# Patient Record
Sex: Female | Born: 1960 | Race: White | Hispanic: No | Marital: Single | Smoking: Former smoker
Health system: Southern US, Community
[De-identification: ages and names within clinical notes are randomized; demographics above are authoritative.]

## PROBLEM LIST (undated history)

## (undated) DIAGNOSIS — F32A Depression, unspecified: Secondary | ICD-10-CM

## (undated) DIAGNOSIS — R011 Cardiac murmur, unspecified: Secondary | ICD-10-CM

## (undated) DIAGNOSIS — Z789 Other specified health status: Secondary | ICD-10-CM

## (undated) DIAGNOSIS — I10 Essential (primary) hypertension: Secondary | ICD-10-CM

## (undated) DIAGNOSIS — F329 Major depressive disorder, single episode, unspecified: Secondary | ICD-10-CM

## (undated) HISTORY — PX: TONSILLECTOMY: SUR1361

---

## 2002-05-28 ENCOUNTER — Other Ambulatory Visit: Admission: RE | Admit: 2002-05-28 | Discharge: 2002-05-28 | Payer: Self-pay | Admitting: *Deleted

## 2002-06-24 ENCOUNTER — Encounter: Admission: RE | Admit: 2002-06-24 | Discharge: 2002-06-24 | Payer: Self-pay | Admitting: *Deleted

## 2004-08-04 ENCOUNTER — Ambulatory Visit (HOSPITAL_BASED_OUTPATIENT_CLINIC_OR_DEPARTMENT_OTHER): Admission: RE | Admit: 2004-08-04 | Discharge: 2004-08-04 | Payer: Self-pay | Admitting: *Deleted

## 2004-08-04 ENCOUNTER — Encounter (INDEPENDENT_AMBULATORY_CARE_PROVIDER_SITE_OTHER): Payer: Self-pay | Admitting: Specialist

## 2004-08-04 ENCOUNTER — Ambulatory Visit (HOSPITAL_COMMUNITY): Admission: RE | Admit: 2004-08-04 | Discharge: 2004-08-04 | Payer: Self-pay | Admitting: *Deleted

## 2005-08-14 ENCOUNTER — Encounter: Admission: RE | Admit: 2005-08-14 | Discharge: 2005-08-14 | Payer: Self-pay | Admitting: *Deleted

## 2009-10-31 ENCOUNTER — Encounter: Admission: RE | Admit: 2009-10-31 | Discharge: 2009-10-31 | Payer: Self-pay | Admitting: Family Medicine

## 2010-11-03 NOTE — Op Note (Signed)
Mariah Diaz, Mariah Diaz               ACCOUNT NO.:  [id number]   MEDICAL RECORD NO.:  [id number]          PATIENT TYPE:  AMB   LOCATION:  [facility]                         FACILITY:  [facility]   PHYSICIAN:  Pershing Cox, M.D.DATE OF BIRTH:  [birth year]   DATE OF PROCEDURE:  08/04/2004  DATE OF DISCHARGE:                                 OPERATIVE REPORT   PREOPERATIVE DIAGNOSIS:  Postmenopausal bleeding and polyp on hydrosonogram.   POSTOPERATIVE DIAGNOSIS:  Tiny endometrial polyp.   PROCEDURE:  1.  Examination under anesthesia.  2.  Fractional D&C.  3.  Hysteroscopy.   SURGEON:  Dr. Carey Bullocks   ASSISTANT:  None.   ANESTHESIA:  General by LMA and Marcaine paracervical block.   SPECIMENS:  Endocervical curettings and endometrial curettings.   INDICATIONS FOR PROCEDURE:  Mariah Diaz is a 50 year old female, who has had no  normal menstrual periods since July 2003.  She began spotting in August  2005.  About that time, we brought her back for a progesterone-induced  withdrawal, and the endometrial lining was thickened at the apex.  She  returned for a follow up hydrosonogram, and there was a confirmation of a  small endometrial polyp seen on this study.  The patient is brought to the  operating room today for resection of this polyp and assurance that there is  hyperplasia in her endometrial cavity.   FINDINGS:  The patient's uterus is retroverted.  There were no adnexal  masses.  The cavity sounded to 3.75 inches.  There were no filling defects  seen.  There was some tissue lying on the posterior wall of the uterus and  when this was removed, it looks like a small polyp.   DESCRIPTION OF PROCEDURE:  Mariah Diaz was brought to the operating room  with an IV in place.  She received 1 g of Ancef in the holding area.  Supine  on the OR table, IV sedation was administered, and an LMA was then easily  placed.  She was then placed into Allen stirrups, and the vagina, perineum  were prepped  with Betadine.  Exam under anesthesia was performed, and the  patient was then draped for a sterile vaginal procedure.  The bladder was  emptied using sterile technique and a red rubber catheter.   The patient's cervix was quite stenotic.  Two tenaculums were placed on the  anterior and posterior cervix.  The vagina was opened using a Simms  retractor, as the weighted retractor was too large.  Endocervical curettings  were obtained.  The sound passed to a depth of 3.75 inches in a retroverted  fashion.  Serial Pratt dilators were then used to dilate the cervix to admit  the resectoscope.  With the resectoscope and through-and-through sorbitol  irrigation, the cavity was visualized and photographed.  Both ostia were  seen.  There was no visible polyp seen, but there was tissue on the  posterior uterine wall and later when this was curetted, on the Telfa, it  appeared to be a small polyp.  A  small sharp curette was used to carry out the curettage.  Following this,  the Simms curette was used to curette the remaining endometrial walls.  The  sound passed again to 3.5 inches.  The patient tolerated the procedure well  and no evidence of perforation.  Specimens included endocervical curettings  and endometrial curettings.      MAJ/MEDQ  D:  08/04/2004  T:  08/04/2004  Job:  [id number]

## 2011-01-31 ENCOUNTER — Other Ambulatory Visit: Payer: Self-pay | Admitting: Family Medicine

## 2011-01-31 DIAGNOSIS — Z1231 Encounter for screening mammogram for malignant neoplasm of breast: Secondary | ICD-10-CM

## 2011-02-20 ENCOUNTER — Ambulatory Visit
Admission: RE | Admit: 2011-02-20 | Discharge: 2011-02-20 | Disposition: A | Discharge status: Home / Self Care | Payer: Self-pay | Source: Ambulatory Visit | Attending: Family Medicine | Admitting: Family Medicine

## 2011-02-20 DIAGNOSIS — Z1231 Encounter for screening mammogram for malignant neoplasm of breast: Secondary | ICD-10-CM

## 2012-03-07 ENCOUNTER — Other Ambulatory Visit: Payer: Self-pay | Admitting: Family Medicine

## 2012-03-07 DIAGNOSIS — Z1231 Encounter for screening mammogram for malignant neoplasm of breast: Secondary | ICD-10-CM

## 2012-03-11 ENCOUNTER — Ambulatory Visit (INDEPENDENT_AMBULATORY_CARE_PROVIDER_SITE_OTHER): Payer: 59

## 2012-03-11 DIAGNOSIS — Z1231 Encounter for screening mammogram for malignant neoplasm of breast: Secondary | ICD-10-CM

## 2012-03-11 DIAGNOSIS — R928 Other abnormal and inconclusive findings on diagnostic imaging of breast: Secondary | ICD-10-CM

## 2012-03-13 ENCOUNTER — Other Ambulatory Visit: Payer: Self-pay | Admitting: Family Medicine

## 2012-03-13 DIAGNOSIS — R928 Other abnormal and inconclusive findings on diagnostic imaging of breast: Secondary | ICD-10-CM

## 2012-03-17 ENCOUNTER — Ambulatory Visit
Admission: RE | Admit: 2012-03-17 | Discharge: 2012-03-17 | Disposition: A | Discharge status: Home / Self Care | Payer: 59 | Source: Ambulatory Visit | Attending: Family Medicine | Admitting: Family Medicine

## 2012-03-17 DIAGNOSIS — R928 Other abnormal and inconclusive findings on diagnostic imaging of breast: Secondary | ICD-10-CM

## 2012-04-11 ENCOUNTER — Other Ambulatory Visit: Payer: Self-pay | Admitting: Orthopaedic Surgery

## 2012-04-14 ENCOUNTER — Other Ambulatory Visit: Payer: Self-pay | Admitting: Orthopaedic Surgery

## 2012-04-17 ENCOUNTER — Encounter (HOSPITAL_BASED_OUTPATIENT_CLINIC_OR_DEPARTMENT_OTHER): Payer: Self-pay | Admitting: *Deleted

## 2012-04-17 NOTE — Progress Notes (Signed)
No labs needed No heart or resp problems 

## 2012-04-18 NOTE — H&P (Signed)
Mariah Diaz is an 51 y.o. female.   Chief Complaint: right knee pain and instability. HPI: Mariah Diaz suffered a twisting injury to her right knee a few months ago.  Since that time she has had some discomfort but the biggest problem is her knee giving way or being unstable.  She is not able to do normal activities and count on her knee not giving way.  She has been through physical therapy and this instability continues.  MRI scan confirms ACL tear.  Date of scan was 02/22/12.  We had discussed proceeding with an ACL reconstruction of the right knee to improve her stability and function.  Past Medical History  Diagnosis Date  . No pertinent past medical history   . Depression     Past Surgical History  Procedure Date  . Tonsillectomy     No family history on file. Social History:  reports that she quit smoking about 9 years ago. She does not have any smokeless tobacco history on file. She reports that she drinks alcohol. Her drug history not on file.  Allergies: No Known Allergies  No prescriptions prior to admission    No results found for this or any previous visit (from the past 48 hour(s)). No results found.  Review of Systems  Constitutional: Negative.   HENT: Negative.   Eyes: Negative.   Respiratory: Negative.   Cardiovascular: Negative.   Gastrointestinal: Negative.   Genitourinary: Negative.   Musculoskeletal: Positive for joint pain.  Skin: Negative.   Neurological: Negative.   Endo/Heme/Allergies: Negative.   Psychiatric/Behavioral: Negative.     Height 5\' 6"  (1.676 m), weight 68.04 kg (150 lb). Physical Exam  Constitutional: She is oriented to person, place, and time. She appears well-nourished.  HENT:  Head: Atraumatic.  Eyes: EOM are normal.  Neck: Neck supple.  Cardiovascular: Normal rate.   Respiratory: Breath sounds normal.  GI: Bowel sounds are normal.  Musculoskeletal:       Right knee exam range of motion 0-130.  Mariah Diaz effusion.  Positive laxity for  Lachman and drawer testing.  Stable to varus and valgus testing.  Neurological: She is oriented to person, place, and time.  Skin: Skin is warm.     Assessment/Plan Assessment: Right knee torn ACL ligament Plan: We have discussed proceeding with a ACL reconstruction surgery to give her right knee more stability and less pain.  We've been to the risks of anesthesia, infection and DVT related to ACL reconstruction.  Also discussed the need for outpatient and postoperative physical therapy.  In terms of work we think that she will miss 2- 4 weeks.  Sigfredo Schreier R 04/18/2012, 6:09 PM

## 2012-04-22 ENCOUNTER — Ambulatory Visit (HOSPITAL_BASED_OUTPATIENT_CLINIC_OR_DEPARTMENT_OTHER)
Admission: RE | Admit: 2012-04-22 | Discharge: 2012-04-22 | Disposition: A | Discharge status: Home / Self Care | Payer: 59 | Source: Ambulatory Visit | Attending: Orthopaedic Surgery | Admitting: Orthopaedic Surgery

## 2012-04-22 ENCOUNTER — Encounter (HOSPITAL_BASED_OUTPATIENT_CLINIC_OR_DEPARTMENT_OTHER)
Admission: RE | Disposition: A | Discharge status: Home / Self Care | Payer: Self-pay | Source: Ambulatory Visit | Attending: Orthopaedic Surgery

## 2012-04-22 ENCOUNTER — Encounter (HOSPITAL_BASED_OUTPATIENT_CLINIC_OR_DEPARTMENT_OTHER): Payer: Self-pay | Admitting: *Deleted

## 2012-04-22 ENCOUNTER — Ambulatory Visit (HOSPITAL_BASED_OUTPATIENT_CLINIC_OR_DEPARTMENT_OTHER): Payer: 59 | Admitting: *Deleted

## 2012-04-22 DIAGNOSIS — M2240 Chondromalacia patellae, unspecified knee: Secondary | ICD-10-CM | POA: Insufficient documentation

## 2012-04-22 DIAGNOSIS — S83519A Sprain of anterior cruciate ligament of unspecified knee, initial encounter: Secondary | ICD-10-CM

## 2012-04-22 DIAGNOSIS — M2350 Chronic instability of knee, unspecified knee: Secondary | ICD-10-CM | POA: Insufficient documentation

## 2012-04-22 HISTORY — DX: Other specified health status: Z78.9

## 2012-04-22 HISTORY — PX: KNEE ARTHROSCOPY WITH ANTERIOR CRUCIATE LIGAMENT (ACL) REPAIR: SHX5644

## 2012-04-22 HISTORY — DX: Major depressive disorder, single episode, unspecified: F32.9

## 2012-04-22 HISTORY — DX: Depression, unspecified: F32.A

## 2012-04-22 SURGERY — KNEE ARTHROSCOPY WITH ANTERIOR CRUCIATE LIGAMENT (ACL) REPAIR
Anesthesia: General | Site: Knee | Laterality: Right | Wound class: Clean

## 2012-04-22 MED ORDER — OXYCODONE-ACETAMINOPHEN 5-325 MG PO TABS: ORAL_TABLET | ORAL | Status: DC

## 2012-04-22 MED ORDER — HYDROMORPHONE HCL PF 1 MG/ML IJ SOLN: 0.2500 mg | INTRAMUSCULAR | Status: DC | PRN

## 2012-04-22 MED ORDER — PROPOFOL 10 MG/ML IV BOLUS
INTRAVENOUS | Status: DC | PRN
  Administered 2012-04-22: 200 mg via INTRAVENOUS

## 2012-04-22 MED ORDER — FENTANYL CITRATE 0.05 MG/ML IJ SOLN
50.0000 ug | INTRAMUSCULAR | Status: DC | PRN
  Administered 2012-04-22: 100 ug via INTRAVENOUS

## 2012-04-22 MED ORDER — DEXAMETHASONE SODIUM PHOSPHATE 4 MG/ML IJ SOLN
INTRAMUSCULAR | Status: DC | PRN
  Administered 2012-04-22: 10 mg via INTRAVENOUS

## 2012-04-22 MED ORDER — OXYCODONE HCL 5 MG PO TABS: 5.0000 mg | ORAL_TABLET | Freq: Once | ORAL | Status: DC | PRN

## 2012-04-22 MED ORDER — LACTATED RINGERS IV SOLN
INTRAVENOUS | Status: DC
  Administered 2012-04-22: 10:00:00 via INTRAVENOUS

## 2012-04-22 MED ORDER — BUPIVACAINE-EPINEPHRINE PF 0.5-1:200000 % IJ SOLN
INTRAMUSCULAR | Status: DC | PRN
  Administered 2012-04-22: 30 mL

## 2012-04-22 MED ORDER — CHLORHEXIDINE GLUCONATE 4 % EX LIQD: 60.0000 mL | Freq: Once | CUTANEOUS | Status: DC

## 2012-04-22 MED ORDER — ACETAMINOPHEN 10 MG/ML IV SOLN
1000.0000 mg | Freq: Once | INTRAVENOUS | Status: AC
  Administered 2012-04-22: 1000 mg via INTRAVENOUS

## 2012-04-22 MED ORDER — ONDANSETRON HCL 4 MG/2ML IJ SOLN
INTRAMUSCULAR | Status: DC | PRN
  Administered 2012-04-22: 4 mg via INTRAVENOUS

## 2012-04-22 MED ORDER — OXYCODONE HCL 5 MG/5ML PO SOLN: 5.0000 mg | Freq: Once | ORAL | Status: DC | PRN

## 2012-04-22 MED ORDER — MORPHINE SULFATE 10 MG/ML IJ SOLN
INTRAMUSCULAR | Status: DC | PRN
  Administered 2012-04-22 (×2): 2 mg via INTRAVENOUS

## 2012-04-22 MED ORDER — LIDOCAINE HCL (CARDIAC) 20 MG/ML IV SOLN
INTRAVENOUS | Status: DC | PRN
  Administered 2012-04-22: 50 mg via INTRAVENOUS

## 2012-04-22 MED ORDER — LACTATED RINGERS IV SOLN
INTRAVENOUS | Status: DC
  Administered 2012-04-22 (×2): via INTRAVENOUS

## 2012-04-22 MED ORDER — SODIUM CHLORIDE 0.9 % IR SOLN
Status: DC | PRN
  Administered 2012-04-22: 17000 mL

## 2012-04-22 MED ORDER — CEFAZOLIN SODIUM-DEXTROSE 2-3 GM-% IV SOLR
2.0000 g | INTRAVENOUS | Status: AC
  Administered 2012-04-22: 2 g via INTRAVENOUS

## 2012-04-22 MED ORDER — MIDAZOLAM HCL 2 MG/2ML IJ SOLN
0.5000 mg | INTRAMUSCULAR | Status: DC | PRN
  Administered 2012-04-22: 2 mg via INTRAVENOUS

## 2012-04-22 SURGICAL SUPPLY — 77 items
APL SKNCLS STERI-STRIP NONHPOA (GAUZE/BANDAGES/DRESSINGS) ×1
BANDAGE ELASTIC 6 VELCRO ST LF (GAUZE/BANDAGES/DRESSINGS) ×2 IMPLANT
BANDAGE ESMARK 6X9 LF (GAUZE/BANDAGES/DRESSINGS) IMPLANT
BANDAGE GAUZE ELAST BULKY 4 IN (GAUZE/BANDAGES/DRESSINGS) ×2 IMPLANT
BENZOIN TINCTURE PRP APPL 2/3 (GAUZE/BANDAGES/DRESSINGS) ×1 IMPLANT
BLADE AVERAGE 25X9 (BLADE) ×1 IMPLANT
BLADE CUDA 5.5 (BLADE) IMPLANT
BLADE GREAT WHITE 4.2 (BLADE) ×2 IMPLANT
BLADE OSCIL/SAGITTAL W/10 ST (BLADE) IMPLANT
BLADE SURG 15 STRL LF DISP TIS (BLADE) ×1 IMPLANT
BLADE SURG 15 STRL SS (BLADE) ×2
BNDG CMPR 9X6 STRL LF SNTH (GAUZE/BANDAGES/DRESSINGS)
BNDG ESMARK 6X9 LF (GAUZE/BANDAGES/DRESSINGS)
BONE PLUG GOLD (MISCELLANEOUS) ×2 IMPLANT
BUR VERTEX HOODED 4.5 (BURR) ×2 IMPLANT
CANISTER OMNI JUG 16 LITER (MISCELLANEOUS) ×1 IMPLANT
CANISTER SUCTION 2500CC (MISCELLANEOUS) IMPLANT
COVER TABLE BACK 60X90 (DRAPES) ×2 IMPLANT
CUFF TOURNIQUET SINGLE 34IN LL (TOURNIQUET CUFF) IMPLANT
DECANTER SPIKE VIAL GLASS SM (MISCELLANEOUS) IMPLANT
DRAPE ARTHROSCOPY W/POUCH 114 (DRAPES) ×2 IMPLANT
DRAPE INCISE IOBAN 66X45 STRL (DRAPES) ×2 IMPLANT
DRAPE U-SHAPE 47X51 STRL (DRAPES) ×2 IMPLANT
DRSG EMULSION OIL 3X3 NADH (GAUZE/BANDAGES/DRESSINGS) ×4 IMPLANT
DURAPREP 26ML APPLICATOR (WOUND CARE) ×2 IMPLANT
ELECT MENISCUS 165MM 90D (ELECTRODE) IMPLANT
ELECT REM PT RETURN 9FT ADLT (ELECTROSURGICAL) ×2
ELECTRODE REM PT RTRN 9FT ADLT (ELECTROSURGICAL) IMPLANT
FIBERSTICK 2 (SUTURE) IMPLANT
GAUZE SPONGE 4X4 16PLY XRAY LF (GAUZE/BANDAGES/DRESSINGS) IMPLANT
GLOVE BIO SURGEON STRL SZ8.5 (GLOVE) ×3 IMPLANT
GLOVE BIOGEL PI IND STRL 8 (GLOVE) ×1 IMPLANT
GLOVE BIOGEL PI IND STRL 8.5 (GLOVE) ×1 IMPLANT
GLOVE BIOGEL PI INDICATOR 8 (GLOVE) ×1
GLOVE BIOGEL PI INDICATOR 8.5 (GLOVE) ×1
GLOVE SS BIOGEL STRL SZ 8 (GLOVE) ×1 IMPLANT
GLOVE SUPERSENSE BIOGEL SZ 8 (GLOVE) ×1
GOWN PREVENTION PLUS XLARGE (GOWN DISPOSABLE) ×2 IMPLANT
GOWN PREVENTION PLUS XXLARGE (GOWN DISPOSABLE) ×3 IMPLANT
GRAFT TISS PATELLAR TNDN 10 (Tissue) IMPLANT
IMMOBILIZER KNEE 22 UNIV (SOFTGOODS) ×1 IMPLANT
IMMOBILIZER KNEE 24 THIGH 36 (MISCELLANEOUS) IMPLANT
IMMOBILIZER KNEE 24 UNIV (MISCELLANEOUS)
KIT TRANSTIBIAL (DISPOSABLE) IMPLANT
KNEE WRAP E Z 3 GEL PACK (MISCELLANEOUS) ×2 IMPLANT
NS IRRIG 1000ML POUR BTL (IV SOLUTION) ×1 IMPLANT
PACK ARTHROSCOPY DSU (CUSTOM PROCEDURE TRAY) ×2 IMPLANT
PACK BASIN DAY SURGERY FS (CUSTOM PROCEDURE TRAY) ×2 IMPLANT
PATELLA LIGAMENT BISECTED FR (Tissue) ×2 IMPLANT
PENCIL BUTTON HOLSTER BLD 10FT (ELECTRODE) IMPLANT
SCREW PROPEL 8X20 (Screw) ×1 IMPLANT
SCREW PROPEL 8X25 (Screw) ×1 IMPLANT
SET ARTHROSCOPY TUBING (MISCELLANEOUS) ×2
SET ARTHROSCOPY TUBING LN (MISCELLANEOUS) ×1 IMPLANT
SHEET MEDIUM DRAPE 40X70 STRL (DRAPES) ×2 IMPLANT
SLEEVE SCD COMPRESS KNEE MED (MISCELLANEOUS) ×2 IMPLANT
SPONGE GAUZE 4X4 12PLY (GAUZE/BANDAGES/DRESSINGS) ×2 IMPLANT
SPONGE LAP 4X18 X RAY DECT (DISPOSABLE) ×2 IMPLANT
STRIP CLOSURE SKIN 1/2X4 (GAUZE/BANDAGES/DRESSINGS) ×2 IMPLANT
SUCTION FRAZIER TIP 10 FR DISP (SUCTIONS) IMPLANT
SUT 2 FIBERLOOP 20 STRT BLUE (SUTURE)
SUT ETHILON 4 0 PS 2 18 (SUTURE) ×2 IMPLANT
SUT PDS AB 0 CT 36 (SUTURE) ×2 IMPLANT
SUT PDS AB 1 CT  36 (SUTURE) ×1
SUT PDS AB 1 CT 36 (SUTURE) ×1 IMPLANT
SUT STEEL 5 (SUTURE) ×2 IMPLANT
SUT VIC AB 0 CT1 27 (SUTURE)
SUT VIC AB 0 CT1 27XBRD ANBCTR (SUTURE) IMPLANT
SUT VIC AB 2-0 SH 27 (SUTURE)
SUT VIC AB 2-0 SH 27XBRD (SUTURE) IMPLANT
SUT VIC AB 3-0 FS2 27 (SUTURE) IMPLANT
SUTURE 2 FIBERLOOP 20 STRT BLU (SUTURE) IMPLANT
SYR 3ML 18GX1 1/2 (SYRINGE) IMPLANT
TOWEL OR 17X24 6PK STRL BLUE (TOWEL DISPOSABLE) ×2 IMPLANT
TOWEL OR NON WOVEN STRL DISP B (DISPOSABLE) ×2 IMPLANT
WAND 30 DEG SABER W/CORD (SURGICAL WAND) IMPLANT
WATER STERILE IRR 1000ML POUR (IV SOLUTION) ×2 IMPLANT

## 2012-04-22 NOTE — Interval H&P Note (Signed)
History and Physical Interval Note:  04/22/2012 9:48 AM  Mariah Diaz  has presented today for surgery, with the diagnosis of RIGHT ACL TEAR AND CHONDROMALASIA  The various methods of treatment have been discussed with the patient and family. After consideration of risks, benefits and other options for treatment, the patient has consented to  Procedure(s) (LRB) with comments: KNEE ARTHROSCOPY WITH ANTERIOR CRUCIATE LIGAMENT (ACL) REPAIR (Right) - WITH ACL RECONSTRUCTION GENERAL WITH BLOCK, Connor aware as a surgical intervention .  The patient's history has been reviewed, patient examined, no change in status, stable for surgery.  I have reviewed the patient's chart and labs.  Questions were answered to the patient's satisfaction.     Curtistine Pettitt G

## 2012-04-22 NOTE — Transfer of Care (Signed)
Immediate Anesthesia Transfer of Care Note  Patient: Mariah Diaz  Procedure(s) Performed: Procedure(s) (LRB) with comments: KNEE ARTHROSCOPY WITH ANTERIOR CRUCIATE LIGAMENT (ACL) REPAIR (Right) - WITH ACL RECONSTRUCTION GENERAL WITH BLOCK, Connor aware  Patient Location: PACU  Anesthesia Type:GA combined with regional for post-op pain  Level of Consciousness: awake and oriented  Airway & Oxygen Therapy: Patient Spontanous Breathing and Patient connected to face mask oxygen  Post-op Assessment: Report given to PACU RN and Post -op Vital signs reviewed and stable  Post vital signs: Reviewed and stable  Complications: No apparent anesthesia complications

## 2012-04-22 NOTE — Anesthesia Procedure Notes (Addendum)
Anesthesia Regional Block:  Femoral nerve block  Pre-Anesthetic Checklist: ,, timeout performed, Correct Patient, Correct Site, Correct Laterality, Correct Procedure, Correct Position, site marked, Risks and benefits discussed, pre-op evaluation,  At surgeon's request and post-op pain management  Laterality: Right  Prep: Maximum Sterile Barrier Precautions used and chloraprep       Needles:  Injection technique: Single-shot  Needle Type: Echogenic Stimulator Needle      Needle Gauge: 22 and 22 G    Additional Needles:  Procedures: ultrasound guided (picture in chart) and nerve stimulator Femoral nerve block  Nerve Stimulator or Paresthesia:  Response: Patellar respose, 0.4 mA,   Additional Responses:   Narrative:  Start time: 04/22/2012 10:20 AM End time: 04/22/2012 10:25 AM Injection made incrementally with aspirations every 5 mL. Anesthesiologist: Fitzgerald,MD  Additional Notes: 2% Lidocaine skin wheel.   Femoral nerve block Procedure Name: LMA Insertion Performed by: York Grice Pre-anesthesia Checklist: Patient identified, Timeout performed, Emergency Drugs available, Suction available and Patient being monitored Patient Re-evaluated:Patient Re-evaluated prior to inductionOxygen Delivery Method: Circle system utilized Preoxygenation: Pre-oxygenation with 100% oxygen Intubation Type: IV induction Ventilation: Mask ventilation without difficulty LMA: LMA inserted LMA Size: 4.0 Number of attempts: 1 Placement Confirmation: breath sounds checked- equal and bilateral and positive ETCO2 Tube secured with: Tape Dental Injury: Teeth and Oropharynx as per pre-operative assessment

## 2012-04-22 NOTE — Op Note (Signed)
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 female with an unstable knee.  The patient has failed non-operative measures and has a knee that does not allow for participation in desired activities.  The patient is offered ACL reconstruction in hopes of stabilizing the knee.  Associated conditions are to be addressed as well.  Informed operative consent was obtained after discussion of risks including reaction to anesthesia, infection, DVT, and stiffness.  The importance of the post-operative rehabilitation protocol to optimize result was stressed extensively with the patient.  SUMMARY OF FINDINGS AND PROCEDURE:  Mariah Diaz was taken to the operative suite where under the above anesthesia a knee arthroscopy and ACL reconstruction was performed. The suprapatellar pouch was benign while the patellofemoral joint showed grade III focal articular cartilage damage.  The medial compartment was notable for no articular cartilage damage and no meniscal pathology.  The ACL was torn and the PCL was intact.  The lateral compartment was notable for noarticular cartilage damage and no meniscal pathology.  The meniscal and articular cartilage problems were addressed with chondroplasty patella. We used patellar tendon allograft material and stabilized at both ends with metal Linvatec screws.   Silvio Pate PA assisted throughout and was invaluable to the completion of the case in that he positioned and retracted and also fashioned the graft on the back table while I performed arthroscopic portions of the case thereby significantly minimizing OR time.  The patient was scheduled to stay overnight at but might go home  depending on condition in the recovery room.  DESCRIPTION OF PROCEDURE:  Mariah Diaz was taken to the operative suite where the above anesthetic was applied.  The patient was positioned supine and prepped and draped in normal sterile fashion.  An appropriate time out was performed.  After the administration of Kefzol pre-operative antibiotic and arthroscopy of the knee was performed. Findings were as noted above and appropriate articular and meniscal cartilage work was done.  The ACL reconstruction was then performed utilizing the above mentioned material.  We prepared the middle third of the patellar tendon allograph   to create contiguous bone plugs from the tibial tubercle and patella. A conservative notch-plasty was done with a burr.  A tourniquet was not utilized.  We prepared the aforementioned graft with saw and drill to fit through planned tunnels and bone plugs were fashioned to be one mm smaller than tunnels.  A guide was placed in the knee anterior to the PCL near the ACL footprint and utilized to place a guide wire up into the knee.  Over this I reamed to a diameter of 11 mm.  A second guide was placed through the medial portal low on the femur at the ACL footprint there and utilized for placement of a guide pin through the femur and out the lateral thigh.  Over this I reamed a femoral tunnel to a diameter of 10 mm and depth of 2 cm.  Bony debris was removed from the knee with the shaver.  The aforementioned graft was pulled through the tibial tunnel into the femoral tunnel with care taken to keep the tendinous portion of the graft in an anterior position as it entered the femoral tunnel.  I  placed a guidewire anterior in the femoral tunnel and over this placed a 8 by 20 mm interference screw.  The knee was ranged and the graft was felt to be very isometric.  Another guidewire was placed through the tibial tunnel and seen to enter the knee arthroscopically.  Over this I placed another interference  screw which was  8 by 25 mm in size.  The knee was again ranged and easily came to full extension with no impingement.  Arthroscopic equipment was removed at this point.  In case of patellar tendon autograft peritenon was closed with #0 vicryl followed by subcutaneous re-approximation in both allograft and autograft cases using 2-0 undyed vicryl and skin closure with nylon.  Adaptic was applied along with a sterile dressing.  Estimated blood loss and intraoperative fluids can be obtained from anesthesia records.  DISPOSITION:  The patient was extubated in the operating room and taken to recovery room in stable condition.  Plans were to stay overnight though the patient might be able to go home same day depending on condition in recovery.    Mariah Diaz G 04/22/2012, 11:51 AM

## 2012-04-22 NOTE — Progress Notes (Signed)
Assisted Dr. Fitzgerald with right, ultrasound guided, femoral block. Side rails up, monitors on throughout procedure. See vital signs in flow sheet. Tolerated Procedure well. 

## 2012-04-22 NOTE — Anesthesia Preprocedure Evaluation (Addendum)
Anesthesia Evaluation  Patient identified by MRN, date of birth, ID band Patient awake    Reviewed: Allergy & Precautions, H&P , NPO status , Patient's Chart, lab work & pertinent test results  Airway Mallampati: II TM Distance: >3 FB Neck ROM: Full    Dental No notable dental hx. (+) Teeth Intact and Dental Advisory Given   Pulmonary neg pulmonary ROS,  breath sounds clear to auscultation  Pulmonary exam normal       Cardiovascular negative cardio ROS  Rhythm:Regular Rate:Normal     Neuro/Psych negative neurological ROS  negative psych ROS   GI/Hepatic negative GI ROS, Neg liver ROS,   Endo/Other  negative endocrine ROS  Renal/GU negative Renal ROS  negative genitourinary   Musculoskeletal   Abdominal   Peds  Hematology negative hematology ROS (+)   Anesthesia Other Findings   Reproductive/Obstetrics negative OB ROS                           Anesthesia Physical Anesthesia Plan  ASA: I  Anesthesia Plan: General and Regional   Post-op Pain Management:    Induction: Intravenous  Airway Management Planned: LMA  Additional Equipment:   Intra-op Plan:   Post-operative Plan: Extubation in OR  Informed Consent: I have reviewed the patients History and Physical, chart, labs and discussed the procedure including the risks, benefits and alternatives for the proposed anesthesia with the patient or authorized representative who has indicated his/her understanding and acceptance.   Dental advisory given  Plan Discussed with: CRNA  Anesthesia Plan Comments:         Anesthesia Quick Evaluation  

## 2012-04-22 NOTE — Anesthesia Postprocedure Evaluation (Signed)
  Anesthesia Post-op Note  Patient: Mariah Diaz  Procedure(s) Performed: Procedure(s) (LRB) with comments: KNEE ARTHROSCOPY WITH ANTERIOR CRUCIATE LIGAMENT (ACL) REPAIR (Right)  Patient Location: PACU  Anesthesia Type:GA combined with regional for post-op pain  Level of Consciousness: awake, alert  and oriented  Airway and Oxygen Therapy: Patient Spontanous Breathing  Post-op Pain: none  Post-op Assessment: Post-op Vital signs reviewed, Patient's Cardiovascular Status Stable, Respiratory Function Stable, Patent Airway and No signs of Nausea or vomiting  Post-op Vital Signs: Reviewed and stable  Complications: No apparent anesthesia complications

## 2012-04-23 ENCOUNTER — Encounter (HOSPITAL_BASED_OUTPATIENT_CLINIC_OR_DEPARTMENT_OTHER): Payer: Self-pay | Admitting: Orthopaedic Surgery

## 2012-08-02 ENCOUNTER — Other Ambulatory Visit: Payer: Self-pay

## 2013-02-18 ENCOUNTER — Other Ambulatory Visit: Payer: Self-pay | Admitting: Family Medicine

## 2013-02-18 DIAGNOSIS — Z1231 Encounter for screening mammogram for malignant neoplasm of breast: Secondary | ICD-10-CM

## 2013-02-18 DIAGNOSIS — Z780 Asymptomatic menopausal state: Secondary | ICD-10-CM

## 2013-03-24 ENCOUNTER — Ambulatory Visit (INDEPENDENT_AMBULATORY_CARE_PROVIDER_SITE_OTHER): Payer: 59

## 2013-03-24 DIAGNOSIS — Z1231 Encounter for screening mammogram for malignant neoplasm of breast: Secondary | ICD-10-CM

## 2013-03-24 DIAGNOSIS — Z780 Asymptomatic menopausal state: Secondary | ICD-10-CM

## 2013-03-24 DIAGNOSIS — M899 Disorder of bone, unspecified: Secondary | ICD-10-CM

## 2013-04-23 ENCOUNTER — Other Ambulatory Visit: Payer: Self-pay

## 2014-02-18 ENCOUNTER — Other Ambulatory Visit: Payer: Self-pay | Admitting: Family Medicine

## 2014-02-18 DIAGNOSIS — Z1231 Encounter for screening mammogram for malignant neoplasm of breast: Secondary | ICD-10-CM

## 2014-03-25 ENCOUNTER — Ambulatory Visit (INDEPENDENT_AMBULATORY_CARE_PROVIDER_SITE_OTHER): Payer: 59

## 2014-03-25 DIAGNOSIS — Z1231 Encounter for screening mammogram for malignant neoplasm of breast: Secondary | ICD-10-CM

## 2015-02-18 ENCOUNTER — Other Ambulatory Visit: Payer: Self-pay | Admitting: Family Medicine

## 2015-02-18 DIAGNOSIS — Z1231 Encounter for screening mammogram for malignant neoplasm of breast: Secondary | ICD-10-CM

## 2015-03-30 ENCOUNTER — Ambulatory Visit: Payer: Self-pay

## 2016-04-01 ENCOUNTER — Encounter (HOSPITAL_BASED_OUTPATIENT_CLINIC_OR_DEPARTMENT_OTHER): Payer: Self-pay | Admitting: Emergency Medicine

## 2016-04-01 ENCOUNTER — Emergency Department (HOSPITAL_BASED_OUTPATIENT_CLINIC_OR_DEPARTMENT_OTHER): Payer: Managed Care, Other (non HMO)

## 2016-04-01 ENCOUNTER — Emergency Department (HOSPITAL_BASED_OUTPATIENT_CLINIC_OR_DEPARTMENT_OTHER)
Admission: EM | Admit: 2016-04-01 | Discharge: 2016-04-01 | Disposition: A | Discharge status: Home / Self Care | Payer: Managed Care, Other (non HMO) | Attending: Emergency Medicine | Admitting: Emergency Medicine

## 2016-04-01 DIAGNOSIS — T1490XA Injury, unspecified, initial encounter: Secondary | ICD-10-CM

## 2016-04-01 DIAGNOSIS — Z79899 Other long term (current) drug therapy: Secondary | ICD-10-CM | POA: Diagnosis not present

## 2016-04-01 DIAGNOSIS — Z87891 Personal history of nicotine dependence: Secondary | ICD-10-CM | POA: Insufficient documentation

## 2016-04-01 DIAGNOSIS — I10 Essential (primary) hypertension: Secondary | ICD-10-CM | POA: Diagnosis not present

## 2016-04-01 DIAGNOSIS — S68123A Partial traumatic metacarpophalangeal amputation of left middle finger, initial encounter: Secondary | ICD-10-CM | POA: Diagnosis not present

## 2016-04-01 DIAGNOSIS — Y929 Unspecified place or not applicable: Secondary | ICD-10-CM | POA: Diagnosis not present

## 2016-04-01 DIAGNOSIS — Y939 Activity, unspecified: Secondary | ICD-10-CM | POA: Insufficient documentation

## 2016-04-01 DIAGNOSIS — Y999 Unspecified external cause status: Secondary | ICD-10-CM | POA: Insufficient documentation

## 2016-04-01 DIAGNOSIS — S68121A Partial traumatic metacarpophalangeal amputation of left index finger, initial encounter: Secondary | ICD-10-CM | POA: Diagnosis not present

## 2016-04-01 DIAGNOSIS — W268XXA Contact with other sharp object(s), not elsewhere classified, initial encounter: Secondary | ICD-10-CM | POA: Insufficient documentation

## 2016-04-01 DIAGNOSIS — S61211A Laceration without foreign body of left index finger without damage to nail, initial encounter: Secondary | ICD-10-CM | POA: Diagnosis present

## 2016-04-01 DIAGNOSIS — S68119A Complete traumatic metacarpophalangeal amputation of unspecified finger, initial encounter: Secondary | ICD-10-CM

## 2016-04-01 HISTORY — DX: Essential (primary) hypertension: I10

## 2016-04-01 NOTE — ED Notes (Signed)
Patient transported to X-ray 

## 2016-04-01 NOTE — ED Triage Notes (Signed)
Pt was hitting a tablet with an ax because she could not find a hammer and cut her L 1st and 2nd fingers. Pt sent here from [facility].

## 2016-04-01 NOTE — ED Provider Notes (Signed)
[facility] DEPT [facility] Provider Note   CSN: [id number] Arrival date & time: 04/01/16  1442  By signing my name below, I, Mariah Diaz, attest that this documentation has been prepared under the direction and in the presence of Gloriann Loan, PA-C. Electronically Signed: Georgette Diaz, ED Scribe. 04/01/16. 4:35 PM.  History   Chief Complaint Chief Complaint  Patient presents with  . Hand Injury   HPI Comments: Mariah Diaz is a 55 y.o. female who presents to the Emergency Department complaining of a left first and second finger laceration s/p mechanical injury around 1 pm today. Pt states she was using an axe when she accidentally sliced her two fingers. Bleeding is controlled with a bandage. She denies any pain at this time. She denies any numbness, fever, or any other associated symptoms. Pt's Tdap is UTD.  The history is provided by the patient. No language interpreter was used.    Past Medical History:  Diagnosis Date  . Depression   . Hypertension   . No pertinent past medical history     There are no active problems to display for this patient.   Past Surgical History:  Procedure Laterality Date  . KNEE ARTHROSCOPY WITH ANTERIOR CRUCIATE LIGAMENT (ACL) REPAIR  04/22/2012   Procedure: KNEE ARTHROSCOPY WITH ANTERIOR CRUCIATE LIGAMENT (ACL) REPAIR;  Surgeon: Hessie Dibble, MD;  Location: [place];  Service: Orthopedics;  Laterality: Right;  . TONSILLECTOMY      OB History    No data available       Home Medications    Prior to Admission medications   Medication Sig Start Date End Date Taking? Authorizing Provider  lisinopril (PRINIVIL,ZESTRIL) 10 MG tablet Take 10 mg by mouth daily.   Yes Historical Provider, MD  oxyCODONE-acetaminophen (ROXICET) 5-325 MG per tablet 1 or 2 pills every 4-6 hours as needed for pain 04/22/12   Roselee Nova, PA-C  sertraline (ZOLOFT) 100 MG tablet Take 100 mg by mouth daily.    Historical Provider, MD  traZODone  (DESYREL) 50 MG tablet Take 50 mg by mouth at bedtime.    Historical Provider, MD    Family History No family history on file.  Social History Social History  Substance Use Topics  . Smoking status: Former Smoker    Quit date: 08/15/2002  . Smokeless tobacco: Never Used  . Alcohol use Yes     Comment: daily     Allergies   Review of patient's allergies indicates no known allergies.   Review of Systems Review of Systems  Constitutional: Negative for fever.  Skin: Positive for wound.  Neurological: Negative for numbness.  All other systems reviewed and are negative.    Physical Exam Updated Vital Signs BP 103/73 (BP Location: Left Arm)   Pulse 67   Resp 18   SpO2 100%   Physical Exam  Constitutional: She is oriented to person, place, and time. She appears well-developed and well-nourished.  HENT:  Head: Normocephalic and atraumatic.  Eyes: Conjunctivae are normal.  Neck: Normal range of motion.  Cardiovascular:  Capillary refill less than 3 seconds distal to injury.   Pulmonary/Chest: Effort normal. No respiratory distress.  Abdominal: She exhibits no distension.  Musculoskeletal: Normal range of motion.  Neurological: She is alert and oriented to person, place, and time.  Strength and sensation intact distal to injury. FROM of MCP, PIPJ, and DIPJ of left hand.   Skin: Skin is warm and dry.  Zone I finger tip amputation of the  1st and 2nd digits.  See photos below.          ED Treatments / Results  COORDINATION OF CARE: 4:32 PM Discussed treatment plan with pt at bedside which includes wound care and pt agreed to plan.  Labs (all labs ordered are listed, but only abnormal results are displayed) Labs Reviewed - No data to display  EKG  EKG Interpretation None       Radiology Dg Hand Complete Left  Result Date: 04/01/2016 CLINICAL DATA:  Pt was hitting a tablet with an axe today because she could not find a hammer and cut her LEFT 1st and 2nd  fingers. Unable to remove gauze due to bleeding. EXAM: LEFT HAND - COMPLETE 3+ VIEW COMPARISON:  None. FINDINGS: There is no evidence of fracture or dislocation. There is no evidence of arthropathy or other focal bone abnormality. There is soft tissue laceration of the distal left second and third digits. IMPRESSION: No acute osseous injury of the left hand. Electronically Signed   By: Kathreen Devoid   On: 04/01/2016 16:22    Procedures Procedures (including critical care time)  Medications Ordered in ED Medications - No data to display   Initial Impression / Assessment and Plan / ED Course  I have reviewed the triage vital signs and the nursing notes.  Pertinent labs & imaging results that were available during my care of the patient were reviewed by me and considered in my medical decision making (see chart for details).  Clinical Course   Nothing to repair.  Normal plain films.  Neurovascularly intact.  Wound cleaned and dressed.  Discussed wound care.  Advise warm daily soapy soaks.  Follow up PCP.  Return precautions discussed.  Stable for discharge.    Final Clinical Impressions(s) / ED Diagnoses   Final diagnoses:  Traumatic amputation of fingertip, initial encounter   I personally performed the services described in this documentation, which was scribed in my presence. The recorded information has been reviewed and is accurate.   New Prescriptions Discharge Medication List as of 04/01/2016  5:42 PM       Gloriann Loan, PA-C 04/02/16 [place], MD 04/02/16 [phone]

## 2016-04-01 NOTE — Discharge Instructions (Signed)
Soak your fingertips in warm soapy water for 10 minutes daily and then reapply a non-adherent dressing.  Your finger will heal, but will take time, should be healed within a month. Follow up with your primary care physician.  Return to the ED if there is any red streaking, pus, swelling, fever, or any other symptoms.

## 2018-08-20 ENCOUNTER — Other Ambulatory Visit: Payer: Self-pay | Admitting: Family Medicine

## 2018-08-20 ENCOUNTER — Ambulatory Visit
Admission: RE | Admit: 2018-08-20 | Discharge: 2018-08-20 | Disposition: A | Discharge status: Home / Self Care | Payer: Managed Care, Other (non HMO) | Source: Ambulatory Visit | Attending: Family Medicine | Admitting: Family Medicine

## 2018-08-20 DIAGNOSIS — M542 Cervicalgia: Secondary | ICD-10-CM

## 2018-08-21 ENCOUNTER — Telehealth: Payer: Self-pay | Admitting: *Deleted

## 2018-08-21 DIAGNOSIS — C799 Secondary malignant neoplasm of unspecified site: Secondary | ICD-10-CM

## 2018-08-21 NOTE — Telephone Encounter (Signed)
Oncology Nurse Navigator Documentation  Oncology Nurse Navigator Flowsheets 08/21/2018  Navigator Location [facility]  Referral date to RadOnc/MedOnc 08/21/2018  Navigator Encounter Type Telephone/I updated Dr. Julien Nordmann, Dr. Roxan Hockey and Dr. Tammi Klippel on referral.  Dr. Julien Nordmann would like to see her tomorrow with urgent referral to rad onc.  I called patient and updated her on appt and referral to rad onc.  She verbalized understanding of appt time and place.   Treatment Phase Abnormal Scans  Barriers/Navigation Needs Education;Coordination of Care  Education Other  Interventions Coordination of Care;Education  Coordination of Care Appts  Education Method Verbal  Acuity Level 2  Time Spent with Patient 30

## 2018-08-22 ENCOUNTER — Other Ambulatory Visit: Payer: Self-pay

## 2018-08-22 ENCOUNTER — Inpatient Hospital Stay: Payer: Managed Care, Other (non HMO) | Attending: Internal Medicine

## 2018-08-22 ENCOUNTER — Encounter: Payer: Self-pay | Admitting: Internal Medicine

## 2018-08-22 ENCOUNTER — Inpatient Hospital Stay: Payer: Managed Care, Other (non HMO) | Admitting: Internal Medicine

## 2018-08-22 ENCOUNTER — Telehealth: Payer: Self-pay | Admitting: Internal Medicine

## 2018-08-22 DIAGNOSIS — C799 Secondary malignant neoplasm of unspecified site: Secondary | ICD-10-CM

## 2018-08-22 DIAGNOSIS — C801 Malignant (primary) neoplasm, unspecified: Secondary | ICD-10-CM | POA: Insufficient documentation

## 2018-08-22 DIAGNOSIS — M4802 Spinal stenosis, cervical region: Secondary | ICD-10-CM

## 2018-08-22 DIAGNOSIS — C7951 Secondary malignant neoplasm of bone: Secondary | ICD-10-CM | POA: Insufficient documentation

## 2018-08-22 DIAGNOSIS — C3490 Malignant neoplasm of unspecified part of unspecified bronchus or lung: Secondary | ICD-10-CM

## 2018-08-22 DIAGNOSIS — M4852XA Collapsed vertebra, not elsewhere classified, cervical region, initial encounter for fracture: Secondary | ICD-10-CM | POA: Diagnosis not present

## 2018-08-22 DIAGNOSIS — Z8659 Personal history of other mental and behavioral disorders: Secondary | ICD-10-CM

## 2018-08-22 DIAGNOSIS — Z800 Family history of malignant neoplasm of digestive organs: Secondary | ICD-10-CM

## 2018-08-22 DIAGNOSIS — Z7289 Other problems related to lifestyle: Secondary | ICD-10-CM

## 2018-08-22 DIAGNOSIS — R590 Localized enlarged lymph nodes: Secondary | ICD-10-CM

## 2018-08-22 DIAGNOSIS — Z8639 Personal history of other endocrine, nutritional and metabolic disease: Secondary | ICD-10-CM

## 2018-08-22 DIAGNOSIS — Z87891 Personal history of nicotine dependence: Secondary | ICD-10-CM

## 2018-08-22 DIAGNOSIS — D491 Neoplasm of unspecified behavior of respiratory system: Secondary | ICD-10-CM | POA: Diagnosis not present

## 2018-08-22 LAB — COMPREHENSIVE METABOLIC PANEL
ALK PHOS: 150 U/L — AB (ref 38–126)
ALT: 23 U/L (ref 0–44)
ANION GAP: 10 (ref 5–15)
AST: 22 U/L (ref 15–41)
Albumin: 4 g/dL (ref 3.5–5.0)
BILIRUBIN TOTAL: 0.1 mg/dL — AB (ref 0.3–1.2)
BUN: 7 mg/dL (ref 6–20)
CALCIUM: 9.4 mg/dL (ref 8.9–10.3)
CO2: 21 mmol/L — AB (ref 22–32)
CREATININE: 0.81 mg/dL (ref 0.44–1.00)
Chloride: 100 mmol/L (ref 98–111)
GFR calc Af Amer: >60 mL/min (ref >60)
GFR calc non Af Amer: >60 mL/min (ref >60)
GLUCOSE: 113 mg/dL — AB (ref 70–99)
Potassium: 4 mmol/L (ref 3.5–5.1)
SODIUM: 131 mmol/L — AB (ref 135–145)
TOTAL PROTEIN: 7.6 g/dL (ref 6.5–8.1)

## 2018-08-22 LAB — CBC WITH DIFFERENTIAL (CANCER CENTER ONLY)
ABS IMMATURE GRANULOCYTES: 0.04 10*3/uL (ref 0.00–0.07)
Basophils Absolute: 0.1 10*3/uL (ref 0.0–0.1)
Basophils Relative: 1 %
EOS PCT: 9 %
Eosinophils Absolute: 1.1 10*3/uL — ABNORMAL HIGH (ref 0.0–0.5)
HEMATOCRIT: 41.1 % (ref 36.0–46.0)
HEMOGLOBIN: 13.5 g/dL (ref 12.0–15.0)
Immature Granulocytes: 0 %
LYMPHS ABS: 1.1 10*3/uL (ref 0.7–4.0)
LYMPHS PCT: 9 %
MCH: 28.3 pg (ref 26.0–34.0)
MCHC: 32.8 g/dL (ref 30.0–36.0)
MCV: 86.2 fL (ref 80.0–100.0)
Monocytes Absolute: 0.7 10*3/uL (ref 0.1–1.0)
Monocytes Relative: 6 %
NEUTROS ABS: 9.1 10*3/uL — AB (ref 1.7–7.7)
Neutrophils Relative %: 75 %
Platelet Count: 302 10*3/uL (ref 150–400)
RBC: 4.77 MIL/uL (ref 3.87–5.11)
RDW: 12.7 % (ref 11.5–15.5)
WBC Count: 12.1 10*3/uL — ABNORMAL HIGH (ref 4.0–10.5)
nRBC: 0 % (ref 0.0–0.2)

## 2018-08-22 NOTE — Telephone Encounter (Signed)
Scheduled appt per 3/6 los - pt aware of appt date and time

## 2018-08-22 NOTE — Progress Notes (Signed)
[place] [phone]   [phone]  CONSULT NOTE  REFERRING PHYSICIAN: Gentry Fitz, MD  REASON FOR CONSULTATION:  58 years old white female with suspicious lung cancer.  HPI Mariah Diaz is a 58 y.o. female with past medical history significant for hypertension, dyslipidemia and depression as well as long history of smoking but quit in 2004.  The patient mentions that she has been complaining of pain in the back of her neck for the last 3 months.  She had several studies done in the past including x-ray of the cervical spines that showed suspicious lesion at C3.  She had MRI of the cervical spine performed on August 19, 2018 that showed severe C3 compression fracture with 70% height loss and retropulsion resulting in severe spinal canal stenosis with faint cord edema.  There was also diffusely low T1 marrow signal within the C3 vertebral body concerning for possible underlying lesion also no definite evidence of epidural or paravertebral tumor.  This was followed by CT of the cervical spine without contrast on August 20, 2018 and that showed constellations of findings most compatible with right lung cancer with related C3 metastasis with vertebra plana.  There was C3 spinal stenosis and cord compression appears stable to the MRI performed the day before.  There was also right apical Pancoast type lung tumor estimated at 6.8 cm.  There was a small but suspicious right paratracheal and bilateral thoracic inlet lymph nodes. The patient was referred to me today for evaluation and recommendation regarding her condition.  When seen today she continues to have the neck pain.  She also has pain under her right breast.  She denied having any shortness of breath but has cough with no hemoptysis.  She denied having any recent weight loss or night sweats.  She has no nausea, vomiting, diarrhea or constipation.  She denied having any neurological deficit.  She is  wearing a soft collar around her neck.  She denied having any headache or visual changes. Family history significant for mother with colon cancer, stroke and diabetes, father had hypertension. The patient is single and has no children.  She used to work as a Psychologist, educational.  She has a history of smoking 1 pack/day for around 23 years and quit in 2004.  She drinks 4 beers every day and no history of drug abuse.  HPI  Past Medical History:  Diagnosis Date  . Depression   . Hypertension   . No pertinent past medical history     Past Surgical History:  Procedure Laterality Date  . KNEE ARTHROSCOPY WITH ANTERIOR CRUCIATE LIGAMENT (ACL) REPAIR  04/22/2012   Procedure: KNEE ARTHROSCOPY WITH ANTERIOR CRUCIATE LIGAMENT (ACL) REPAIR;  Surgeon: Hessie Dibble, MD;  Location: [employer] [facility];  Service: Orthopedics;  Laterality: Right;  . TONSILLECTOMY      History reviewed. No pertinent family history.  Social History Social History   Tobacco Use  . Smoking status: Former Smoker    Last attempt to quit: 08/15/2002    Years since quitting: 16.0  . Smokeless tobacco: Never Used  Substance Use Topics  . Alcohol use: Yes    Comment: daily  . Drug use: No    No Known Allergies  Current Outpatient Medications  Medication Sig Dispense Refill  . lisinopril (PRINIVIL,ZESTRIL) 10 MG tablet Take 10 mg by mouth daily.    . sertraline (ZOLOFT) 100 MG tablet Take 100 mg by mouth daily.    Marland Kitchen  traZODone (DESYREL) 50 MG tablet Take 50 mg by mouth at bedtime.    Marland Kitchen ibuprofen (ADVIL,MOTRIN) 600 MG tablet Take 1 tablet by mouth every 8 (eight) hours as needed.    Marland Kitchen oxyCODONE-acetaminophen (ROXICET) 5-325 MG per tablet 1 or 2 pills every 4-6 hours as needed for pain (Patient not taking: Reported on 08/22/2018) 50 tablet 0   No current facility-administered medications for this visit.     Review of Systems  Constitutional: positive for fatigue Eyes: negative Ears, nose, mouth,  throat, and face: negative Respiratory: positive for cough Cardiovascular: negative Gastrointestinal: negative Genitourinary:negative Integument/breast: negative Hematologic/lymphatic: negative Musculoskeletal:positive for neck pain Neurological: negative Behavioral/Psych: negative Endocrine: negative Allergic/Immunologic: negative  Physical Exam  OJJ:[id number], healthy, no distress, well nourished and well developed SKIN: skin color, texture, turgor are normal, no rashes or significant lesions HEAD: Normocephalic, No masses, lesions, tenderness or abnormalities EYES: normal, PERRLA, Conjunctiva are pink and non-injected EARS: External ears normal, Canals clear OROPHARYNX:no exudate, no erythema and lips, buccal mucosa, and tongue normal  NECK: supple, no adenopathy, no JVD LYMPH:  no palpable lymphadenopathy, no hepatosplenomegaly BREAST:not examined LUNGS: clear to auscultation , and palpation HEART: regular rate & rhythm, no murmurs and no gallops ABDOMEN:abdomen soft, non-tender, normal bowel sounds and no masses or organomegaly BACK: Back symmetric, no curvature., No CVA tenderness EXTREMITIES:no joint deformities, effusion, or inflammation, no edema  NEURO: alert & oriented x 3 with fluent speech, no focal motor/sensory deficits  PERFORMANCE STATUS: ECOG 1  LABORATORY DATA: Lab Results  Component Value Date   WBC 12.1 (H) 08/22/2018   HGB 13.5 08/22/2018   HCT 41.1 08/22/2018   MCV 86.2 08/22/2018   PLT 302 08/22/2018      Chemistry      Component Value Date/Time   NA 131 (L) 08/22/2018 1052   K 4.0 08/22/2018 1052   CL 100 08/22/2018 1052   CO2 21 (L) 08/22/2018 1052   BUN 7 08/22/2018 1052   CREATININE 0.81 08/22/2018 1052      Component Value Date/Time   CALCIUM 9.4 08/22/2018 1052   ALKPHOS 150 (H) 08/22/2018 1052   AST 22 08/22/2018 1052   ALT 23 08/22/2018 1052   BILITOT 0.1 (L) 08/22/2018 1052       RADIOGRAPHIC STUDIES: Ct Cervical Spine Wo  Contrast  Result Date: 08/21/2018 CLINICAL DATA:  58 year old female discovered to have severe C3 compression fracture on recent MRI, suspicious for a pathologic fracture. EXAM: CT CERVICAL SPINE WITHOUT CONTRAST TECHNIQUE: Multidetector CT imaging of the cervical spine was performed without intravenous contrast. Multiplanar CT image reconstructions were also generated. COMPARISON:  MRI 08/19/2018. FINDINGS: Alignment: Stable from the recent MRI. Mild retropulsion and slight left retrolisthesis about the abnormal C3 vertebra. Posterior elements remain normally aligned. Subtle degenerative appearing anterolisthesis of C7 on T1. Visualized skull base is intact. No atlanto-occipital dissociation. Skull base and vertebrae: There is near vertebra plana of C3, and portions of the vertebra are eroded/destroyed, most notably the right C3 pedicle and facet as demonstrated on sagittal image 27 and series 4, image 39. Bone mineralization at the visible skull base appears normal. No other cervical vertebral bone lesion is identified. The visible upper thoracic vertebrae and ribs also appear intact, but the right upper lung is abnormal as described below. Soft tissues and spinal canal: C3 level spinal stenosis with cord compression was better demonstrated on the recent MRI. There are small but conspicuous 7-8 millimeter lymph nodes at the bilateral thoracic inlet/level 4. above the thoracic  inlet the noncontrast neck soft tissues appear within normal limits. Disc levels: Better demonstrated on the recent MRI, including C3 spinal stenosis and cord compression. Upper chest: There is a right apical lung mass with superimposed small right pleural effusion. The mass is inseparable from the right paratracheal mediastinum and measures at least 68 x 66 x 46 millimeters (AP by transverse by CC). Some centrilobular emphysema is evident in the left upper lung. There are small but conspicuous right paratracheal lymph nodes. Other findings:  Visualized paranasal sinuses and mastoids are well pneumatized. Negative visible noncontrast posterior fossa. IMPRESSION: 1. Constellation of findings most compatible with right lung cancer related C3 metastasis with vertebra plana. C3 spinal stenosis and cord compression appears stable to the MRI yesterday. 2. Right apical Pancoast type lung tumor estimated at 6.8 cm. Small but suspicious right paratracheal and bilateral thoracic inlet lymph nodes. 3. No other cervical or upper thoracic bone metastasis identified by CT. These results will be called to the ordering clinician or representative by the Radiologist Assistant, and communication documented in the PACS or zVision Dashboard. Electronically Signed   By: Genevie Ann M.D.   On: 08/21/2018 08:38    ASSESSMENT: This is a very pleasant 58 years old white female with highly suspicious metastatic lung cancer presented with right Pancoast tumor and metastatic disease to C3 as well as mediastinal lymphadenopathy.  The patient has no tissue diagnosis and no complete staging work-up yet.  She is currently asymptomatic except for the neck pain.  She denied having any neurological deficit so far.   PLAN: I had a lengthy discussion with the patient today about her current disease status and further investigation to confirm her diagnosis. I personally and independently reviewed the scan images and discussed the result and showed the images to the patient today. I recommended for the patient to complete the staging work-up by ordering a PET scan as well as MRI of the brain.  Once the PET scan results becomes available we will choose the right place for tissue diagnosis.  And I will order the biopsy as soon as the PET scan results becomes available. I will urgently refer the patient to radiation oncology for evaluation.  I also spoke to Dr. Tammi Klippel after the visit and he will contact neurosurgeon for evaluation and recommendation regarding her C3 lesion and concern  about cord compression. I will arrange for the patient to come back for follow-up visit in 2 weeks or less depending on the results of the imaging studies and biopsy results. The patient was also advised to call immediately if she has any concerning symptoms in the interval. The patient voices understanding of current disease status and treatment options and is in agreement with the current care plan.  All questions were answered. The patient knows to call the clinic with any problems, questions or concerns. We can certainly see the patient much sooner if necessary.  Thank you so much for allowing me to participate in the care of Mariah Diaz. I will continue to follow up the patient with you and assist in her care.  I spent 40 minutes counseling the patient face to face. The total time spent in the appointment was 60 minutes.  Disclaimer: This note was dictated with voice recognition software. Similar sounding words can inadvertently be transcribed and may not be corrected upon review.   Eilleen Kempf August 22, 2018, 11:49 AM

## 2018-08-23 ENCOUNTER — Telehealth: Payer: Self-pay | Admitting: Radiation Oncology

## 2018-08-23 DIAGNOSIS — C3490 Malignant neoplasm of unspecified part of unspecified bronchus or lung: Secondary | ICD-10-CM | POA: Insufficient documentation

## 2018-08-23 DIAGNOSIS — C7951 Secondary malignant neoplasm of bone: Secondary | ICD-10-CM | POA: Insufficient documentation

## 2018-08-23 NOTE — Telephone Encounter (Signed)
I reviewed an EPIC in-basket message regarding this patient.  The message referred to a cervical spine lesion.  I quickly reviewed the patient's imaging CT and MRI of the neck.  These appear to show a pathologic fracture of the C3 vertebral body with some evidence to suggest spinal cord compression at C3 with cord signal changes.  Her neck CT confirms this finding as well as demonstrates a 7 cm right apical lung mass suggesting lung cancer.  The patient has not been diagnosed with lung cancer and is currently set up for ambulatory work-up including brain MRI, PET scan and probable lung biopsy.  However, given the acuity of the C3 lesion, I telephoned her this afternoon.  The patient confirms that she does have neck pain and has been wearing a soft collar for approximately 1 month.  She reports that the pain medicine helps with her neck pain.  She is neurologically intact by report.  I contacted the neurosurgeon on call, Dr. Emelda Brothers.  He will review her imaging and contact her regarding possible intervention.  I called the patient back on her mobile phone number to update her and there was no answer.  I left a voicemail indicating that Dr. Zada Finders would be calling this afternoon.

## 2018-08-25 ENCOUNTER — Encounter: Payer: Self-pay | Admitting: *Deleted

## 2018-08-25 ENCOUNTER — Other Ambulatory Visit: Payer: Self-pay | Admitting: Radiation Oncology

## 2018-08-25 DIAGNOSIS — G9520 Unspecified cord compression: Secondary | ICD-10-CM

## 2018-08-25 NOTE — Progress Notes (Signed)
Oncology Nurse Navigator Documentation  Oncology Nurse Navigator Flowsheets 08/25/2018  Navigator Location [facility]  Navigator Encounter Type Other/I followed up on patient's PET scan.  This has not been authorized so I reached out to managed care dept to help expedite authorization.    Treatment Phase Abnormal Scans  Barriers/Navigation Needs Coordination of Care  Interventions Coordination of Care  Coordination of Care Other  Acuity Level 1  Time Spent with Patient 15

## 2018-08-25 NOTE — Progress Notes (Signed)
Presented case in our brain conference today.  She appears to have a metastasis to the C3 spine from a probable right apical lung cancer.  Her MRI shows pathologic compression fracture of C3 with spinal cord compression including spinal cord signal change suggesting edema.  This is a very serious and urgent condition, likely requiring C3 corpectomy and adjuvant radiotherapy.  I made a referral to Dr. Vertell Limber today for a walk-in appointment this afternoon and communicated this to the patient.

## 2018-08-26 ENCOUNTER — Other Ambulatory Visit: Payer: Self-pay | Admitting: Neurosurgery

## 2018-08-26 ENCOUNTER — Other Ambulatory Visit: Payer: Self-pay | Admitting: Radiation Therapy

## 2018-08-26 ENCOUNTER — Telehealth: Payer: Self-pay | Admitting: Radiation Therapy

## 2018-08-26 NOTE — Telephone Encounter (Signed)
Annaclaire is scheduled for surgery with Dr. Vertell Limber on Friday 3/13. The consult with Dr. Sondra Come and SIM have been cancelled for now. We will regroup after her surgery and present in the brain and spine MDC for next steps and treatment plan.   I spoke with Carel about this and she is very happy with this plan and understands the thought behind waiting until after surgery to move forward.     Mont Dutton R.T.(R)(T) Special Procedures Navigator

## 2018-08-27 ENCOUNTER — Ambulatory Visit: Payer: Managed Care, Other (non HMO)

## 2018-08-27 ENCOUNTER — Ambulatory Visit: Payer: Managed Care, Other (non HMO) | Admitting: Radiation Oncology

## 2018-08-28 ENCOUNTER — Other Ambulatory Visit: Payer: Self-pay

## 2018-08-28 ENCOUNTER — Encounter (HOSPITAL_COMMUNITY): Payer: Self-pay | Admitting: *Deleted

## 2018-08-28 ENCOUNTER — Ambulatory Visit: Payer: Managed Care, Other (non HMO) | Admitting: Radiation Oncology

## 2018-08-28 NOTE — Progress Notes (Signed)
Spoke with pt for pre-op call. Pt denies cardiac history and diabetes. Pt is having a late surgery, I spoke with Dr. Valma Cava, Anesthesiologist prior to calling pt and he stated pt could have a light breakfast by 8 AM and then NPO. Instructed pt on this.

## 2018-08-29 ENCOUNTER — Inpatient Hospital Stay (HOSPITAL_COMMUNITY): Payer: Managed Care, Other (non HMO)

## 2018-08-29 ENCOUNTER — Inpatient Hospital Stay (HOSPITAL_COMMUNITY): Payer: Managed Care, Other (non HMO) | Admitting: Certified Registered"

## 2018-08-29 ENCOUNTER — Encounter (HOSPITAL_COMMUNITY): Payer: Self-pay | Admitting: *Deleted

## 2018-08-29 ENCOUNTER — Inpatient Hospital Stay (HOSPITAL_COMMUNITY)
Admission: RE | Admit: 2018-08-29 | Discharge: 2018-08-30 | DRG: 472 | Disposition: A | Discharge status: Home / Self Care | Payer: Managed Care, Other (non HMO) | Attending: Neurosurgery | Admitting: Neurosurgery

## 2018-08-29 ENCOUNTER — Other Ambulatory Visit: Payer: Self-pay

## 2018-08-29 ENCOUNTER — Encounter (HOSPITAL_COMMUNITY)
Admission: RE | Disposition: A | Discharge status: Home / Self Care | Payer: Self-pay | Source: Home / Self Care | Attending: Neurosurgery

## 2018-08-29 DIAGNOSIS — Z87891 Personal history of nicotine dependence: Secondary | ICD-10-CM

## 2018-08-29 DIAGNOSIS — C3491 Malignant neoplasm of unspecified part of right bronchus or lung: Secondary | ICD-10-CM | POA: Diagnosis present

## 2018-08-29 DIAGNOSIS — G9529 Other cord compression: Secondary | ICD-10-CM | POA: Diagnosis present

## 2018-08-29 DIAGNOSIS — M8458XA Pathological fracture in neoplastic disease, other specified site, initial encounter for fracture: Principal | ICD-10-CM | POA: Diagnosis present

## 2018-08-29 DIAGNOSIS — I10 Essential (primary) hypertension: Secondary | ICD-10-CM | POA: Diagnosis present

## 2018-08-29 DIAGNOSIS — C7951 Secondary malignant neoplasm of bone: Secondary | ICD-10-CM | POA: Diagnosis present

## 2018-08-29 DIAGNOSIS — G992 Myelopathy in diseases classified elsewhere: Secondary | ICD-10-CM | POA: Diagnosis present

## 2018-08-29 DIAGNOSIS — Z419 Encounter for procedure for purposes other than remedying health state, unspecified: Secondary | ICD-10-CM

## 2018-08-29 DIAGNOSIS — F329 Major depressive disorder, single episode, unspecified: Secondary | ICD-10-CM | POA: Diagnosis present

## 2018-08-29 DIAGNOSIS — M542 Cervicalgia: Secondary | ICD-10-CM | POA: Diagnosis present

## 2018-08-29 HISTORY — DX: Cardiac murmur, unspecified: R01.1

## 2018-08-29 HISTORY — PX: ANTERIOR CERVICAL CORPECTOMY: SHX1159

## 2018-08-29 LAB — TYPE AND SCREEN
ABO/RH(D): O POS
Antibody Screen: NEGATIVE

## 2018-08-29 LAB — ABO/RH: ABO/RH(D): O POS

## 2018-08-29 SURGERY — ANTERIOR CERVICAL CORPECTOMY
Anesthesia: General | Site: Spine Cervical

## 2018-08-29 MED ORDER — LIDOCAINE 2% (20 MG/ML) 5 ML SYRINGE
INTRAMUSCULAR | Status: AC
  Filled 2018-08-29: qty 5

## 2018-08-29 MED ORDER — CEFAZOLIN SODIUM-DEXTROSE 2-4 GM/100ML-% IV SOLN
2.0000 g | Freq: Three times a day (TID) | INTRAVENOUS | Status: AC
  Administered 2018-08-29 – 2018-08-30 (×2): 2 g via INTRAVENOUS
  Filled 2018-08-29 (×2): qty 100

## 2018-08-29 MED ORDER — BISACODYL 10 MG RE SUPP: 10.0000 mg | Freq: Every day | RECTAL | Status: DC | PRN

## 2018-08-29 MED ORDER — PANTOPRAZOLE SODIUM 40 MG PO TBEC
40.0000 mg | DELAYED_RELEASE_TABLET | Freq: Every day | ORAL | Status: DC
  Administered 2018-08-29: 40 mg via ORAL
  Filled 2018-08-29: qty 1

## 2018-08-29 MED ORDER — OXYCODONE HCL 5 MG PO TABS
ORAL_TABLET | ORAL | Status: AC
  Filled 2018-08-29: qty 2

## 2018-08-29 MED ORDER — FENTANYL CITRATE (PF) 100 MCG/2ML IJ SOLN
INTRAMUSCULAR | Status: AC
  Administered 2018-08-29: 50 ug via INTRAVENOUS
  Filled 2018-08-29: qty 2

## 2018-08-29 MED ORDER — SODIUM CHLORIDE 0.9% FLUSH: 3.0000 mL | INTRAVENOUS | Status: DC | PRN

## 2018-08-29 MED ORDER — ESMOLOL HCL 100 MG/10ML IV SOLN
INTRAVENOUS | Status: AC
  Filled 2018-08-29: qty 10

## 2018-08-29 MED ORDER — ACETAMINOPHEN 325 MG PO TABS: 650.0000 mg | ORAL_TABLET | ORAL | Status: DC | PRN

## 2018-08-29 MED ORDER — MORPHINE SULFATE (PF) 2 MG/ML IV SOLN: 2.0000 mg | INTRAVENOUS | Status: DC | PRN

## 2018-08-29 MED ORDER — PRAVASTATIN SODIUM 10 MG PO TABS: 20.0000 mg | ORAL_TABLET | Freq: Every day | ORAL | Status: DC

## 2018-08-29 MED ORDER — POLYETHYLENE GLYCOL 3350 17 G PO PACK: 17.0000 g | PACK | Freq: Every day | ORAL | Status: DC | PRN

## 2018-08-29 MED ORDER — SERTRALINE HCL 50 MG PO TABS
200.0000 mg | ORAL_TABLET | Freq: Every day | ORAL | Status: DC
  Administered 2018-08-30: 200 mg via ORAL
  Filled 2018-08-29: qty 4

## 2018-08-29 MED ORDER — METHOCARBAMOL 1000 MG/10ML IJ SOLN
500.0000 mg | Freq: Four times a day (QID) | INTRAVENOUS | Status: DC | PRN
  Filled 2018-08-29: qty 5

## 2018-08-29 MED ORDER — ONDANSETRON HCL 4 MG PO TABS: 4.0000 mg | ORAL_TABLET | Freq: Four times a day (QID) | ORAL | Status: DC | PRN

## 2018-08-29 MED ORDER — GLYCOPYRROLATE 0.2 MG/ML IJ SOLN
INTRAMUSCULAR | Status: DC | PRN
  Administered 2018-08-29: 0.2 mg via INTRAVENOUS

## 2018-08-29 MED ORDER — DEXAMETHASONE SODIUM PHOSPHATE 10 MG/ML IJ SOLN
INTRAMUSCULAR | Status: AC
  Filled 2018-08-29: qty 1

## 2018-08-29 MED ORDER — DEXAMETHASONE SODIUM PHOSPHATE 10 MG/ML IJ SOLN
INTRAMUSCULAR | Status: DC | PRN
  Administered 2018-08-29: 10 mg via INTRAVENOUS

## 2018-08-29 MED ORDER — LISINOPRIL 20 MG PO TABS
20.0000 mg | ORAL_TABLET | Freq: Every day | ORAL | Status: DC
  Administered 2018-08-30: 20 mg via ORAL
  Filled 2018-08-29: qty 1

## 2018-08-29 MED ORDER — 0.9 % SODIUM CHLORIDE (POUR BTL) OPTIME
TOPICAL | Status: DC | PRN
  Administered 2018-08-29: 1000 mL

## 2018-08-29 MED ORDER — ALUM & MAG HYDROXIDE-SIMETH 200-200-20 MG/5ML PO SUSP: 30.0000 mL | Freq: Four times a day (QID) | ORAL | Status: DC | PRN

## 2018-08-29 MED ORDER — FENTANYL CITRATE (PF) 100 MCG/2ML IJ SOLN
INTRAMUSCULAR | Status: DC | PRN
  Administered 2018-08-29: 100 ug via INTRAVENOUS
  Administered 2018-08-29: 50 ug via INTRAVENOUS
  Administered 2018-08-29 (×2): 25 ug via INTRAVENOUS
  Administered 2018-08-29: 50 ug via INTRAVENOUS

## 2018-08-29 MED ORDER — SODIUM CHLORIDE 0.9% FLUSH: 3.0000 mL | Freq: Two times a day (BID) | INTRAVENOUS | Status: DC

## 2018-08-29 MED ORDER — OXYCODONE-ACETAMINOPHEN 5-325 MG PO TABS: 1.0000 | ORAL_TABLET | ORAL | Status: DC | PRN

## 2018-08-29 MED ORDER — LACTATED RINGERS IV SOLN
INTRAVENOUS | Status: DC
  Administered 2018-08-29: 14:00:00 via INTRAVENOUS

## 2018-08-29 MED ORDER — PROMETHAZINE HCL 25 MG/ML IJ SOLN: 6.2500 mg | INTRAMUSCULAR | Status: DC | PRN

## 2018-08-29 MED ORDER — HYDROCODONE-ACETAMINOPHEN 5-325 MG PO TABS: 1.0000 | ORAL_TABLET | ORAL | Status: DC | PRN

## 2018-08-29 MED ORDER — ZOLPIDEM TARTRATE 5 MG PO TABS: 5.0000 mg | ORAL_TABLET | Freq: Every evening | ORAL | Status: DC | PRN

## 2018-08-29 MED ORDER — ACETAMINOPHEN 650 MG RE SUPP: 650.0000 mg | RECTAL | Status: DC | PRN

## 2018-08-29 MED ORDER — BUPIVACAINE HCL (PF) 0.5 % IJ SOLN
INTRAMUSCULAR | Status: AC
  Filled 2018-08-29: qty 30

## 2018-08-29 MED ORDER — PROPOFOL 10 MG/ML IV BOLUS
INTRAVENOUS | Status: DC | PRN
  Administered 2018-08-29: 140 mg via INTRAVENOUS

## 2018-08-29 MED ORDER — OXYCODONE HCL 5 MG PO TABS
10.0000 mg | ORAL_TABLET | ORAL | Status: DC | PRN
  Administered 2018-08-29 – 2018-08-30 (×3): 10 mg via ORAL
  Filled 2018-08-29 (×2): qty 2

## 2018-08-29 MED ORDER — GLYCOPYRROLATE PF 0.2 MG/ML IJ SOSY
PREFILLED_SYRINGE | INTRAMUSCULAR | Status: AC
  Filled 2018-08-29: qty 1

## 2018-08-29 MED ORDER — TRAZODONE HCL 50 MG PO TABS
50.0000 mg | ORAL_TABLET | Freq: Every day | ORAL | Status: DC
  Filled 2018-08-29: qty 1

## 2018-08-29 MED ORDER — SODIUM CHLORIDE 0.9 % IV SOLN: 250.0000 mL | INTRAVENOUS | Status: DC

## 2018-08-29 MED ORDER — CEFAZOLIN SODIUM-DEXTROSE 2-4 GM/100ML-% IV SOLN
2.0000 g | INTRAVENOUS | Status: AC
  Administered 2018-08-29: 2 g via INTRAVENOUS
  Filled 2018-08-29: qty 100

## 2018-08-29 MED ORDER — ALBUTEROL SULFATE (2.5 MG/3ML) 0.083% IN NEBU
INHALATION_SOLUTION | RESPIRATORY_TRACT | Status: AC
  Administered 2018-08-29: 2.5 mg via RESPIRATORY_TRACT
  Filled 2018-08-29: qty 3

## 2018-08-29 MED ORDER — CHLORHEXIDINE GLUCONATE CLOTH 2 % EX PADS: 6.0000 | MEDICATED_PAD | Freq: Once | CUTANEOUS | Status: DC

## 2018-08-29 MED ORDER — PHENYLEPHRINE 40 MCG/ML (10ML) SYRINGE FOR IV PUSH (FOR BLOOD PRESSURE SUPPORT)
PREFILLED_SYRINGE | INTRAVENOUS | Status: AC
  Filled 2018-08-29: qty 10

## 2018-08-29 MED ORDER — LIDOCAINE 2% (20 MG/ML) 5 ML SYRINGE
INTRAMUSCULAR | Status: DC | PRN
  Administered 2018-08-29: 60 mg via INTRAVENOUS

## 2018-08-29 MED ORDER — ONDANSETRON HCL 4 MG/2ML IJ SOLN: 4.0000 mg | Freq: Four times a day (QID) | INTRAMUSCULAR | Status: DC | PRN

## 2018-08-29 MED ORDER — METHOCARBAMOL 500 MG PO TABS
ORAL_TABLET | ORAL | Status: AC
  Filled 2018-08-29: qty 1

## 2018-08-29 MED ORDER — PANTOPRAZOLE SODIUM 40 MG IV SOLR: 40.0000 mg | Freq: Every day | INTRAVENOUS | Status: DC

## 2018-08-29 MED ORDER — KCL IN DEXTROSE-NACL 20-5-0.45 MEQ/L-%-% IV SOLN: INTRAVENOUS | Status: DC

## 2018-08-29 MED ORDER — PHENOL 1.4 % MT LIQD: 1.0000 | OROMUCOSAL | Status: DC | PRN

## 2018-08-29 MED ORDER — BUPIVACAINE HCL (PF) 0.5 % IJ SOLN
INTRAMUSCULAR | Status: DC | PRN
  Administered 2018-08-29: 5 mL

## 2018-08-29 MED ORDER — ACETAMINOPHEN 10 MG/ML IV SOLN
INTRAVENOUS | Status: DC | PRN
  Administered 2018-08-29: 1000 mg via INTRAVENOUS

## 2018-08-29 MED ORDER — METHOCARBAMOL 500 MG PO TABS
500.0000 mg | ORAL_TABLET | Freq: Four times a day (QID) | ORAL | Status: DC | PRN
  Administered 2018-08-29: 500 mg via ORAL

## 2018-08-29 MED ORDER — ROCURONIUM BROMIDE 10 MG/ML (PF) SYRINGE
PREFILLED_SYRINGE | INTRAVENOUS | Status: DC | PRN
  Administered 2018-08-29: 50 mg via INTRAVENOUS

## 2018-08-29 MED ORDER — LIDOCAINE-EPINEPHRINE 1 %-1:100000 IJ SOLN
INTRAMUSCULAR | Status: AC
  Filled 2018-08-29: qty 1

## 2018-08-29 MED ORDER — FENTANYL CITRATE (PF) 250 MCG/5ML IJ SOLN
INTRAMUSCULAR | Status: AC
  Filled 2018-08-29: qty 5

## 2018-08-29 MED ORDER — FLEET ENEMA 7-19 GM/118ML RE ENEM: 1.0000 | ENEMA | Freq: Once | RECTAL | Status: DC | PRN

## 2018-08-29 MED ORDER — MENTHOL 3 MG MT LOZG: 1.0000 | LOZENGE | OROMUCOSAL | Status: DC | PRN

## 2018-08-29 MED ORDER — MIDAZOLAM HCL 2 MG/2ML IJ SOLN
INTRAMUSCULAR | Status: AC
  Filled 2018-08-29: qty 2

## 2018-08-29 MED ORDER — FENTANYL CITRATE (PF) 100 MCG/2ML IJ SOLN
25.0000 ug | INTRAMUSCULAR | Status: DC | PRN
  Administered 2018-08-29 (×2): 50 ug via INTRAVENOUS

## 2018-08-29 MED ORDER — FENTANYL CITRATE (PF) 100 MCG/2ML IJ SOLN
50.0000 ug | Freq: Once | INTRAMUSCULAR | Status: AC
  Administered 2018-08-29: 50 ug via INTRAVENOUS

## 2018-08-29 MED ORDER — DOCUSATE SODIUM 100 MG PO CAPS
100.0000 mg | ORAL_CAPSULE | Freq: Two times a day (BID) | ORAL | Status: DC
  Administered 2018-08-29 – 2018-08-30 (×2): 100 mg via ORAL
  Filled 2018-08-29 (×2): qty 1

## 2018-08-29 MED ORDER — ALBUTEROL SULFATE (2.5 MG/3ML) 0.083% IN NEBU
2.5000 mg | INHALATION_SOLUTION | Freq: Once | RESPIRATORY_TRACT | Status: AC
  Administered 2018-08-29: 2.5 mg via RESPIRATORY_TRACT

## 2018-08-29 MED ORDER — THROMBIN 5000 UNITS EX SOLR
OROMUCOSAL | Status: DC | PRN
  Administered 2018-08-29: 17:00:00

## 2018-08-29 MED ORDER — ONDANSETRON HCL 4 MG/2ML IJ SOLN
INTRAMUSCULAR | Status: AC
  Filled 2018-08-29: qty 2

## 2018-08-29 MED ORDER — ACETAMINOPHEN 10 MG/ML IV SOLN
INTRAVENOUS | Status: AC
  Filled 2018-08-29: qty 100

## 2018-08-29 MED ORDER — OXYCODONE-ACETAMINOPHEN 5-325 MG PO TABS
2.0000 | ORAL_TABLET | ORAL | Status: DC | PRN
  Filled 2018-08-29: qty 2

## 2018-08-29 MED ORDER — MIDAZOLAM HCL 5 MG/5ML IJ SOLN
INTRAMUSCULAR | Status: DC | PRN
  Administered 2018-08-29: 2 mg via INTRAVENOUS

## 2018-08-29 MED ORDER — ONDANSETRON HCL 4 MG/2ML IJ SOLN
INTRAMUSCULAR | Status: DC | PRN
  Administered 2018-08-29: 4 mg via INTRAVENOUS

## 2018-08-29 MED ORDER — ACETAMINOPHEN-CODEINE #3 300-30 MG PO TABS: 1.0000 | ORAL_TABLET | ORAL | Status: DC | PRN

## 2018-08-29 MED ORDER — LIDOCAINE-EPINEPHRINE 1 %-1:100000 IJ SOLN
INTRAMUSCULAR | Status: DC | PRN
  Administered 2018-08-29: 5 mL

## 2018-08-29 MED ORDER — ROCURONIUM BROMIDE 50 MG/5ML IV SOSY
PREFILLED_SYRINGE | INTRAVENOUS | Status: AC
  Filled 2018-08-29: qty 10

## 2018-08-29 MED ORDER — SODIUM CHLORIDE 0.9 % IV SOLN
INTRAVENOUS | Status: DC | PRN
  Administered 2018-08-29: 50 ug/min via INTRAVENOUS

## 2018-08-29 MED ORDER — THROMBIN 5000 UNITS EX SOLR
CUTANEOUS | Status: AC
  Filled 2018-08-29: qty 5000

## 2018-08-29 MED ORDER — BIOTIN 10000 MCG PO TABS: 10000.0000 ug | ORAL_TABLET | Freq: Every day | ORAL | Status: DC

## 2018-08-29 MED ORDER — LACTATED RINGERS IV SOLN
INTRAVENOUS | Status: DC
  Administered 2018-08-29: 17:00:00 via INTRAVENOUS

## 2018-08-29 SURGICAL SUPPLY — 75 items
ADH SKN CLS APL DERMABOND .7 (GAUZE/BANDAGES/DRESSINGS) ×1
BASKET BONE COLLECTION (BASKET) ×1 IMPLANT
BIT DRILL NEURO 2X3.1 SFT TUCH (MISCELLANEOUS) ×1 IMPLANT
BIT DRILL POWER (BIT) ×1 IMPLANT
BLADE ULTRA TIP 2M (BLADE) ×1 IMPLANT
BNDG GAUZE ELAST 4 BULKY (GAUZE/BANDAGES/DRESSINGS) IMPLANT
BUR BARREL STRAIGHT FLUTE 4.0 (BURR) ×3 IMPLANT
BUR ROUND FLUTED 4 SOFT TCH (BURR) IMPLANT
BUR ROUND FLUTED 4MM SOFT TCH (BURR)
CAGE CORE MONOLITH 12X10 (Cage) ×3 IMPLANT
CANISTER SUCT 3000ML PPV (MISCELLANEOUS) ×3 IMPLANT
CAP END MONOLITH PARA 14 RND (Cap) IMPLANT
CARTRIDGE OIL MAESTRO DRILL (MISCELLANEOUS) ×1 IMPLANT
COVER MAYO STAND STRL (DRAPES) ×3 IMPLANT
COVER WAND RF STERILE (DRAPES) ×1 IMPLANT
DECANTER SPIKE VIAL GLASS SM (MISCELLANEOUS) ×3 IMPLANT
DERMABOND ADVANCED (GAUZE/BANDAGES/DRESSINGS) ×2
DERMABOND ADVANCED .7 DNX12 (GAUZE/BANDAGES/DRESSINGS) ×1 IMPLANT
DIFFUSER DRILL AIR PNEUMATIC (MISCELLANEOUS) ×3 IMPLANT
DRAPE HALF SHEET 40X57 (DRAPES) IMPLANT
DRAPE LAPAROTOMY 100X72 PEDS (DRAPES) ×3 IMPLANT
DRAPE MICROSCOPE LEICA (MISCELLANEOUS) ×3 IMPLANT
DRAPE POUCH INSTRU U-SHP 10X18 (DRAPES) ×3 IMPLANT
DRILL BIT POWER (BIT) ×3
DRILL NEURO 2X3.1 SOFT TOUCH (MISCELLANEOUS) ×3
DRSG OPSITE POSTOP 3X4 (GAUZE/BANDAGES/DRESSINGS) ×2 IMPLANT
DURAPREP 6ML APPLICATOR 50/CS (WOUND CARE) ×3 IMPLANT
ELECT COATED BLADE 2.86 ST (ELECTRODE) ×3 IMPLANT
ELECT REM PT RETURN 9FT ADLT (ELECTROSURGICAL) ×3
ELECTRODE REM PT RTRN 9FT ADLT (ELECTROSURGICAL) ×1 IMPLANT
ENDCAP MONOLITH PARA 14 RND (Cap) ×4 IMPLANT
GAUZE 4X4 16PLY RFD (DISPOSABLE) IMPLANT
GAUZE SPONGE 4X4 12PLY STRL (GAUZE/BANDAGES/DRESSINGS) IMPLANT
GLOVE BIO SURGEON STRL SZ8 (GLOVE) ×3 IMPLANT
GLOVE BIOGEL PI IND STRL 6.5 (GLOVE) ×2 IMPLANT
GLOVE BIOGEL PI IND STRL 7.5 (GLOVE) IMPLANT
GLOVE BIOGEL PI IND STRL 8 (GLOVE) ×1 IMPLANT
GLOVE BIOGEL PI IND STRL 8.5 (GLOVE) ×1 IMPLANT
GLOVE BIOGEL PI INDICATOR 6.5 (GLOVE) ×4
GLOVE BIOGEL PI INDICATOR 7.5 (GLOVE) ×2
GLOVE BIOGEL PI INDICATOR 8 (GLOVE) ×2
GLOVE BIOGEL PI INDICATOR 8.5 (GLOVE) ×2
GLOVE ECLIPSE 8.0 STRL XLNG CF (GLOVE) ×3 IMPLANT
GLOVE EXAM NITRILE XL STR (GLOVE) IMPLANT
GLOVE SS N UNI LF 6.5 STRL (GLOVE) ×8 IMPLANT
GOWN STRL REUS W/ TWL LRG LVL3 (GOWN DISPOSABLE) IMPLANT
GOWN STRL REUS W/ TWL XL LVL3 (GOWN DISPOSABLE) IMPLANT
GOWN STRL REUS W/TWL 2XL LVL3 (GOWN DISPOSABLE) IMPLANT
GOWN STRL REUS W/TWL LRG LVL3 (GOWN DISPOSABLE)
GOWN STRL REUS W/TWL XL LVL3 (GOWN DISPOSABLE)
HALTER HD/CHIN CERV TRACTION D (MISCELLANEOUS) ×3 IMPLANT
HEMOSTAT POWDER KIT SURGIFOAM (HEMOSTASIS) ×3 IMPLANT
KIT BASIN OR (CUSTOM PROCEDURE TRAY) ×3 IMPLANT
KIT TURNOVER KIT B (KITS) ×3 IMPLANT
NDL HYPO 25X1 1.5 SAFETY (NEEDLE) ×1 IMPLANT
NDL SPNL 22GX3.5 QUINCKE BK (NEEDLE) ×1 IMPLANT
NEEDLE HYPO 25X1 1.5 SAFETY (NEEDLE) ×3 IMPLANT
NEEDLE SPNL 22GX3.5 QUINCKE BK (NEEDLE) ×6 IMPLANT
NS IRRIG 1000ML POUR BTL (IV SOLUTION) ×3 IMPLANT
OIL CARTRIDGE MAESTRO DRILL (MISCELLANEOUS) ×3
PACK LAMINECTOMY NEURO (CUSTOM PROCEDURE TRAY) ×3 IMPLANT
PAD ARMBOARD 7.5X6 YLW CONV (MISCELLANEOUS) ×9 IMPLANT
PIN DISTRACTION 14MM (PIN) ×6 IMPLANT
PLATE ARCHON 2-LEVEL 32MM (Plate) ×3 IMPLANT
PUTTY DBX 2.5CC (Putty) ×3 IMPLANT
PUTTY DBX 2.5CC DEPUY (Putty) IMPLANT
RUBBERBAND STERILE (MISCELLANEOUS) ×6 IMPLANT
SCREW ARCHON SELFTAP 4.0X15MM (Screw) ×8 IMPLANT
SPONGE INTESTINAL PEANUT (DISPOSABLE) ×6 IMPLANT
SPONGE SURGIFOAM ABS GEL SZ50 (HEMOSTASIS) IMPLANT
STAPLER SKIN PROX WIDE 3.9 (STAPLE) IMPLANT
SUT VIC AB 3-0 SH 8-18 (SUTURE) ×3 IMPLANT
TOWEL GREEN STERILE (TOWEL DISPOSABLE) ×3 IMPLANT
TOWEL GREEN STERILE FF (TOWEL DISPOSABLE) ×3 IMPLANT
WATER STERILE IRR 1000ML POUR (IV SOLUTION) ×3 IMPLANT

## 2018-08-29 NOTE — Anesthesia Procedure Notes (Signed)
Arterial Line Insertion Start/End3/13/2020 2:33 PM, 08/29/2018 2:33 PM Performed by: Teressa Lower., CRNA, CRNA  Patient location: Pre-op. Preanesthetic checklist: patient identified, IV checked, site marked, risks and benefits discussed, surgical consent, monitors and equipment checked, pre-op evaluation, timeout performed and anesthesia consent Lidocaine 1% used for infiltration Left, radial was placed Catheter size: 20 G Hand hygiene performed , maximum sterile barriers used  and Seldinger technique used Allen's test indicative of satisfactory collateral circulation Attempts: 1 Procedure performed without using ultrasound guided technique. Following insertion, dressing applied and Biopatch. Post procedure assessment: normal and unchanged  Patient tolerated the procedure well with no immediate complications.

## 2018-08-29 NOTE — Interval H&P Note (Signed)
History and Physical Interval Note:  08/29/2018 3:52 PM  Mariah Diaz  has presented today for surgery, with the diagnosis of Compression fracture of Cervical 3 Vertebra.  The various methods of treatment have been discussed with the patient and family. After consideration of risks, benefits and other options for treatment, the patient has consented to  Procedure(s) with comments: Cervical 3 Corpectomy with Cervical 2 to Cervical 4 plate (N/A) - Cervical 3 Corpectomy with Cervical 2 to Cervical 4 plate as a surgical intervention.  The patient's history has been reviewed, patient examined, no change in status, stable for surgery.  I have reviewed the patient's chart and labs.  Questions were answered to the patient's satisfaction.     Peggyann Shoals

## 2018-08-29 NOTE — Anesthesia Preprocedure Evaluation (Signed)
Anesthesia Evaluation  Patient identified by MRN, date of birth, ID band Patient awake    Reviewed: Allergy & Precautions, NPO status , Patient's Chart, lab work & pertinent test results  Airway Mallampati: II  TM Distance: >3 FB Neck ROM: Full    Dental  (+) Dental Advisory Given   Pulmonary former smoker,  Lung CA   breath sounds clear to auscultation       Cardiovascular hypertension, Pt. on medications  Rhythm:Regular Rate:Normal     Neuro/Psych Lung CA with spinal mets and C3 compression fx with cord compression.    GI/Hepatic negative GI ROS, Neg liver ROS,   Endo/Other  negative endocrine ROS  Renal/GU negative Renal ROS     Musculoskeletal   Abdominal   Peds  Hematology negative hematology ROS (+)   Anesthesia Other Findings   Reproductive/Obstetrics                             Lab Results  Component Value Date   WBC 12.1 (H) 08/22/2018   HGB 13.5 08/22/2018   HCT 41.1 08/22/2018   MCV 86.2 08/22/2018   PLT 302 08/22/2018   Lab Results  Component Value Date   CREATININE 0.81 08/22/2018   BUN 7 08/22/2018   NA 131 (L) 08/22/2018   K 4.0 08/22/2018   CL 100 08/22/2018   CO2 21 (L) 08/22/2018    Anesthesia Physical Anesthesia Plan  ASA: III  Anesthesia Plan: General   Post-op Pain Management:    Induction: Intravenous  PONV Risk Score and Plan: 3 and Ondansetron, Dexamethasone and Treatment may vary due to age or medical condition  Airway Management Planned: Oral ETT  Additional Equipment: Arterial line  Intra-op Plan:   Post-operative Plan: Extubation in OR and Possible Post-op intubation/ventilation  Informed Consent: I have reviewed the patients History and Physical, chart, labs and discussed the procedure including the risks, benefits and alternatives for the proposed anesthesia with the patient or authorized representative who has indicated his/her  understanding and acceptance.     Dental advisory given  Plan Discussed with: CRNA  Anesthesia Plan Comments:         Anesthesia Quick Evaluation

## 2018-08-29 NOTE — Op Note (Signed)
08/29/2018  6:40 PM  PATIENT:  Mariah Diaz  58 y.o. 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, cervicalgia   PROCEDURE:  Procedure(s) with comments: Cervical 3 Corpectomy with Cervical 2 to Cervical 4 plate (N/A) - Cervical 3 Corpectomy with Cervical 2 to Cervical 4 plate  SURGEON:  Surgeon(s) and Role:    Erline Levine, MD - Primary  PHYSICIAN ASSISTANT:   ASSISTANTS: Poteat, RN   ANESTHESIA:   general  EBL:  10 mL   BLOOD ADMINISTERED:none  DRAINS: (10) Jackson-Pratt drain(s) with closed bulb suction in the prevertebral space   LOCAL MEDICATIONS USED:  MARCAINE    and LIDOCAINE   SPECIMEN:  Excision  DISPOSITION OF SPECIMEN:  PATHOLOGY  COUNTS:  YES  TOURNIQUET:  * No tourniquets in log *  DICTATION: Patient is 58 year old female with metastasis with compression fracture of C 3 with newly diagnosed lung cancer with cord compression, cervical myelopathy.  Vertebral body of C 3 has been largely replaced by tumor.  It was elected to take her to surgery for Corpectomy and reconstruction of C 2- C 4 levels.  PROCEDURE: Patient was brought to operating room and following the smooth and uncomplicated induction of general endotracheal anesthesia her head was placed on a horseshoe head holder she was placed in 5 pounds of Holter traction and her anterior neck was prepped and draped in usual sterile fashion. C-arm was used throughout the case.  An incision was made on the left side of midline after infiltrating the skin and subcutaneous tissues with local lidocaine. The platysmal layer was incised and subplatysmal dissection was performed exposing the anterior border sternocleidomastoid muscle. Using blunt dissection the carotid  sheath was kept lateral and trachea and esophagus kept medial exposing the anterior cervical spine. A bent spinal needle was placed it was felt to be the C 45 level and this was confirmed on intraoperative x-ray. Longus coli muscles were taken down from the anterior cervical spine using electrocautery and key elevator and self-retaining retractor was placed exposing the C 23 and C 34 levels. Distraction pins were placed at C 2 and C 4.  The interspaces were incised and a thorough discectomy was performed. The fractured vertebra of C 3 was removed.  Metastatic cancer was removed and cleared from the dura behind the C 3 vertebra.  The spinal cord dura was widely decompressed. Hemostasis was assured. After trial sizing a 16 mm peek interbody cage (Monolith Nuvasive 12 x 10 mm core with 14 mm parallel endcaps) was selected and packed with DBM. This was tamped into position and countersunk appropriately.  A 32 mm Archon anterior cervical plate was affixed to the cervical spine with 15 mm fixed-angle screws 2 at C 2, 2 at C 4. All screws were well-positioned and locking mechanisms were engaged. A final X ray was obtained which showed well positioned grafts and anterior plate without complicating features. Soft tissues were inspected and found to be in good repair. The wound was irrigated. A # 10 JP drain was inserted through a separate stab incision.  The platysma layer was closed with 3-0 Vicryl stitches and the skin was reapproximated with 3-0 Vicryl subcuticular stitches. The wound was dressed with Dermabond and an occlusive dressing. Counts were correct at the end of the case. Patient was  extubated and taken to recovery in stable and satisfactory condition.   PLAN OF CARE: Admit to inpatient   PATIENT DISPOSITION:  PACU - hemodynamically stable.   Delay start of Pharmacological VTE agent (>24hrs) due to surgical blood loss or risk of bleeding: yes

## 2018-08-29 NOTE — Transfer of Care (Signed)
Immediate Anesthesia Transfer of Care Note  Patient: Mariah Diaz  Procedure(s) Performed: Cervical 3 Corpectomy with Cervical 2 to Cervical 4 plate (N/A Spine Cervical)  Patient Location: PACU  Anesthesia Type:General  Level of Consciousness: awake, alert , oriented and patient cooperative  Airway & Oxygen Therapy: Patient Spontanous Breathing and Patient connected to face mask oxygen  Post-op Assessment: Report given to RN and Post -op Vital signs reviewed and stable  Post vital signs: Reviewed and stable  Last Vitals:  Vitals Value Taken Time  BP    Temp    Pulse 103 08/29/2018  6:44 PM  Resp 16 08/29/2018  6:44 PM  SpO2 96 % 08/29/2018  6:44 PM  Vitals shown include unvalidated device data.  Last Pain:  Vitals:   08/29/18 1843  TempSrc:   PainSc: (P) 0-No pain      Patients Stated Pain Goal: 4 (78/47/84 1282)  Complications: No apparent anesthesia complications

## 2018-08-29 NOTE — Anesthesia Postprocedure Evaluation (Signed)
Anesthesia Post Note  Patient: Mariah Diaz  Procedure(s) Performed: Cervical 3 Corpectomy with Cervical 2 to Cervical 4 plate (N/A Spine Cervical)     Patient location during evaluation: PACU Anesthesia Type: General Level of consciousness: awake and alert Pain management: pain level controlled Vital Signs Assessment: post-procedure vital signs reviewed and stable Respiratory status: spontaneous breathing, nonlabored ventilation, respiratory function stable and patient connected to nasal cannula oxygen Cardiovascular status: blood pressure returned to baseline and stable Postop Assessment: no apparent nausea or vomiting Anesthetic complications: no    Last Vitals:  Vitals:   08/29/18 1923 08/29/18 1955  BP: (!) 152/91 (!) 166/93  Pulse: (!) 101 95  Resp: 15 18  Temp:  36.6 C  SpO2: 94% 95%    Last Pain:  Vitals:   08/29/18 1955  TempSrc: Oral  PainSc:                  Catalina Gravel

## 2018-08-29 NOTE — Interval H&P Note (Signed)
History and Physical Interval Note:  08/29/2018 3:51 PM  Mariah Diaz  has presented today for surgery, with the diagnosis of Compression fracture of Cervical 3 Vertebra.  The various methods of treatment have been discussed with the patient and family. After consideration of risks, benefits and other options for treatment, the patient has consented to  Procedure(s) with comments: Cervical 3 Corpectomy with Cervical 2 to Cervical 4 plate (N/A) - Cervical 3 Corpectomy with Cervical 2 to Cervical 4 plate as a surgical intervention.  The patient's history has been reviewed, patient examined, no change in status, stable for surgery.  I have reviewed the patient's chart and labs.  Questions were answered to the patient's satisfaction.     Peggyann Shoals

## 2018-08-29 NOTE — Anesthesia Procedure Notes (Signed)
Procedure Name: Intubation Date/Time: 08/29/2018 4:50 PM Performed by: Cleda Daub, CRNA Pre-anesthesia Checklist: Patient identified, Emergency Drugs available, Suction available and Patient being monitored Patient Re-evaluated:Patient Re-evaluated prior to induction Oxygen Delivery Method: Circle system utilized Preoxygenation: Pre-oxygenation with 100% oxygen Induction Type: IV induction Ventilation: Mask ventilation without difficulty and Mask ventilation throughout procedure Laryngoscope Size: Glidescope and 4 Grade View: Grade I Tube type: Oral Tube size: 7.0 mm Number of attempts: 1 Airway Equipment and Method: Stylet and Video-laryngoscopy Placement Confirmation: ETT inserted through vocal cords under direct vision,  positive ETCO2 and breath sounds checked- equal and bilateral Secured at: 21 cm Tube secured with: Tape Dental Injury: Teeth and Oropharynx as per pre-operative assessment

## 2018-08-29 NOTE — H&P (Signed)
Patient ID:   [id number] Patient: Mariah Diaz  Date of Birth: 1961-05-01 Visit Type: Office Visit   Date: 08/25/2018 02:15 PM Provider: Marchia Meiers. Vertell Limber MD   This 58 year old female presents for neck pain.  HISTORY OF PRESENT ILLNESS:  1.  neck pain  Mariah Diaz, 58 year old female, visits for evaluation.  She reports neck pain and numbness/tingling ears to the base of neck bilaterally since December.  PCP obtained MRI which revealed a right apical lung mass and C3 fracture and edema with cord compression.  She was referred urgently, today, while awaiting ordered chest CT. She denies numbness or tingling at any other location. She notes neck pain currently that she attributes to being upright for too long. Patient has a stiff collar that she wears at home but wears a soft cervical collar to drive.  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 Rogue Jury set up CT scan for lung and recommended biopsy - saw Dr. Rogue Jury last week. Chest CT scheduled for either wednesday or Thursday.  X-ray and MRI on [place]         Medical/Surgical/Interim History Reviewed, no change.     PAST MEDICAL HISTORY, SURGICAL HISTORY, FAMILY HISTORY, SOCIAL HISTORY AND REVIEW OF SYSTEMS I have reviewed the patient's past medical, surgical, family and social history as well as the comprehensive review of systems as included on the [facility] history form dated 08/25/2018, which I have signed.  Family History:  Reviewed, no changes.   Patient reports there is no relevant family history.   Social History: Reviewed, no changes.   MEDICATIONS: (added, continued or stopped this visit) Started Medication Directions Instruction Stopped   QBRELIS take 10 milliliter by oral route  every day     trazodone  ORAL      Zoloft take 5 milliliter by oral route   every day and mix with 4 oz. (1/2 cup) of water, ginger ale, lemon/lime soda, lemonade or orange juice ONLY       ALLERGIES: Ingredient Reaction Medication Name Comment  NO KNOWN ALLERGIES     No known allergies.   REVIEW OF SYSTEMS   See scanned patient registration form, dated 08/25/2018, signed and dated on 08/26/2018  Review of Systems Details System Neg/Pos Details  Constitutional Negative Chills, Fatigue, Fever, Malaise, Night sweats, Weight gain and Weight loss.  ENMT Negative Ear drainage, Hearing loss, Nasal drainage, Otalgia, Sinus pressure and Sore throat.  Eyes Negative Eye discharge, Eye pain and Vision changes.  Respiratory Negative Chronic cough, Cough, Dyspnea, Known TB exposure and Wheezing.  Cardio Negative Chest pain, Claudication, Edema and Irregular heartbeat/palpitations.  GI Negative Abdominal pain, Blood in stool, Change in stool pattern, Constipation, Decreased appetite, Diarrhea, Heartburn, Nausea and Vomiting.  GU Negative Dysuria, Hematuria, Polyuria (Genitourinary), Urinary frequency, Urinary incontinence and Urinary retention.  Endocrine Negative Cold intolerance, Heat intolerance, Polydipsia and Polyphagia.  Neuro Negative Dizziness, Extremity weakness, Gait disturbance, Headache, Memory impairment, Numbness in extremity, Seizures and Tremors.  Psych Negative Anxiety, Depression and Insomnia.  Integumentary Negative Brittle hair, Brittle nails, Change in shape/size of mole(s), Hair loss, Hirsutism, Hives, Pruritus, Rash and Skin lesion.  MS Positive Neck pain.  Hema/Lymph Negative Easy bleeding, Easy bruising and Lymphadenopathy.  Allergic/Immuno Negative Contact allergy, Environmental allergies, Food allergies and Seasonal allergies.  Reproductive Negative Breast discharge, Breast lumps, Dysmenorrhea, Dyspareunia, History of abnormal  PAP smear, Hot flashes, Irregular menses and Vaginal discharge.   PHYSICAL EXAM:   Vitals Date Temp F BP Pulse Ht In  Wt Lb BMI BSA Pain Score  08/25/2018  151/90 81 66 173 27.92  5/10    PHYSICAL EXAM Details General Level of Distress: no acute distress Overall Appearance: normal  Head and Face  Right Left  Fundoscopic Exam:  normal normal    Cardiovascular Cardiac: regular rate and rhythm without murmur  Right Left  Carotid Pulses: normal normal  Respiratory Lungs: clear to auscultation  Neurological Orientation: normal Recent and Remote Memory: normal Attention Span and Concentration:   normal Language: normal Fund of Knowledge: normal  Right Left Sensation: normal normal Upper Extremity Coordination: normal normal  Lower Extremity Coordination: normal normal  Musculoskeletal Gait and Station: normal  Right Left Upper Extremity Muscle Strength: normal normal Lower Extremity Muscle Strength: normal normal Upper Extremity Muscle Tone:  normal normal Lower Extremity Muscle Tone: normal normal   Motor Strength Upper and lower extremity motor strength was tested in the clinically pertinent muscles. Any abnormal findings will be noted below.   Right Left Grip: 4/5 4/5 Finger Extensor: 4/5 4/5   Deep Tendon Reflexes  Right Left Biceps: normal normal Triceps: normal normal Brachioradialis: normal normal Patellar: normal normal Achilles: normal normal  Sensory Sensation was tested at C2 to T1.   Cranial Nerves II. Optic Nerve/Visual Fields: normal III. Oculomotor: normal IV. Trochlear: normal V. Trigeminal: normal VI. Abducens: normal VII. Facial: normal VIII. Acoustic/Vestibular: normal IX. Glossopharyngeal: normal X. Vagus: normal XI. Spinal Accessory: normal XII. Hypoglossal: normal  Motor and other Tests Lhermittes: negative Rhomberg: negative Pronator drift: absent     Right Left Spurlings negative negative Hoffman's: normal normal Clonus: normal normal Babinski: normal normal SLR: negative negative Patrick's Corky Sox): negative negative Toe  Walk: normal normal Toe Lift: normal normal Heel Walk: normal normal Tinels Elbow: negative negative Tinels Wrist: negative negative Phalen: negative negative   Additional Findings:  Some numbness in fingers of both hands    IMPRESSION:   Upon examination, hand instrinsics 4/5 bilaterally, finger extensors 4/5 bilaterally, full strength in bilateral lower extremities, hypperreflexic at bilateral knees  C-spine MRI reveals compression fracture of C3 with spinal cord compression, increased cord signal,  CT reveals narrowing around spinal cord and C3 fracture,   Lung cancer has metastasised to C3 which is putting pressure on spinal cord and lead to compression fracture at C3.   Recommended surgical intervention and advised patient that this could cause paralysis or death if left untreated. Recommended C3 corpectomy. Advised patient that this is not something to ignore. This area will likely need radiation after surgery. Discussed with patient that there may need to be a surgery to stabilize after surgery if spine is unstable. Discussed with patient that surgery would likely be for a couple hours and she will return the next day most likely. Discussed recovery with patient. Recommended patient wear stiff cervical collar to protect her neck.   recommended surgery sooner rather than later.  Risks of surgery include: surgery does not heal, low risk of needing to stabilize surgery from the back. Tentative surgery for Friday.  PLAN:  1. Nurse education given 2. Patient is scheduled for C 3 corpectomy with plating C 2 -  C4 levels  Orders: Diagnostic Procedures: Assessment Procedure  G95.20 Cervical Spine- AP/Lat   Assessment/Plan   # Detail Type Description   1. Assessment Compression fracture of C3 vertebra, initial encounter (S12.290A).  2. Assessment Primary malignant neoplasm of right lung metastatic to other site (C34.91).       3. 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        4. Assessment Cervical spinal cord compression (G95.20).         Pain Management Plan Pain Scale: 5/10. Method: Numeric Pain Intensity Scale. Location: neck. Onset: 05/26/2018. Duration: varies. Quality: discomforting. Pain management follow-up plan of care: Patient taking medication as prescribed.  Fall Risk Plan The patient has not fallen in the last year.              Provider:  Marchia Meiers. Vertell Limber MD  08/26/2018 09:58 AM Dictation edited by: Marchia Meiers. Vertell Limber    CC Providers: [facility] [address] [place],  [place]  [address]-   Matthew Manning  [facility] [address] [place], [place] [address]-               Electronically signed by Marchia Meiers Vertell Limber MD on 08/26/2018 09:59 AM

## 2018-08-29 NOTE — Progress Notes (Signed)
Awake, alert, conversant.  Full strength bilateral hand intrinsics with decreased numbness in hands and feet.  Doing well.

## 2018-08-29 NOTE — Brief Op Note (Signed)
08/29/2018  6:40 PM  PATIENT:  Mariah Diaz  58 y.o. 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, cervicalgia   PROCEDURE:  Procedure(s) with comments: Cervical 3 Corpectomy with Cervical 2 to Cervical 4 plate (N/A) - Cervical 3 Corpectomy with Cervical 2 to Cervical 4 plate  SURGEON:  Surgeon(s) and Role:    Erline Levine, MD - Primary  PHYSICIAN ASSISTANT:   ASSISTANTS: [place], RN   ANESTHESIA:   general  EBL:  10 mL   BLOOD ADMINISTERED:none  DRAINS: (10) Jackson-Pratt drain(s) with closed bulb suction in the prevertebral space   LOCAL MEDICATIONS USED:  MARCAINE    and LIDOCAINE   SPECIMEN:  Excision  DISPOSITION OF SPECIMEN:  PATHOLOGY  COUNTS:  YES  TOURNIQUET:  * No tourniquets in log *  DICTATION: Patient is 58 year old female with metastasis with compression fracture of C 3 with newly diagnosed lung cancer with cord compression, cervical myelopathy.  Vertebral body of C 3 has been largely replaced by tumor.  It was elected to take her to surgery for Corpectomy and reconstruction of C 2- C 4 levels.  PROCEDURE: Patient was brought to operating room and following the smooth and uncomplicated induction of general endotracheal anesthesia her head was placed on a horseshoe head holder she was placed in 5 pounds of Holter traction and her anterior neck was prepped and draped in usual sterile fashion. C-arm was used throughout the case.  An incision was made on the left side of midline after infiltrating the skin and subcutaneous tissues with local lidocaine. The platysmal layer was incised and subplatysmal dissection was performed exposing the anterior border sternocleidomastoid muscle. Using blunt dissection the carotid  sheath was kept lateral and trachea and esophagus kept medial exposing the anterior cervical spine. A bent spinal needle was placed it was felt to be the C 45 level and this was confirmed on intraoperative x-ray. Longus coli muscles were taken down from the anterior cervical spine using electrocautery and key elevator and self-retaining retractor was placed exposing the C 23 and C 34 levels. Distraction pins were placed at C 2 and C 4.  The interspaces were incised and a thorough discectomy was performed. The fractured vertebra of C 3 was removed.  Metastatic cancer was removed and cleared from the dura behind the C 3 vertebra.  The spinal cord dura was widely decompressed. Hemostasis was assured. After trial sizing a 16 mm peek interbody cage (Monolith Nuvasive 12 x 10 mm core with 14 mm parallel endcaps) was selected and packed with DBM. This was tamped into position and countersunk appropriately.  A 32 mm Archon anterior cervical plate was affixed to the cervical spine with 15 mm fixed-angle screws 2 at C 2, 2 at C 4. All screws were well-positioned and locking mechanisms were engaged. A final X ray was obtained which showed well positioned grafts and anterior plate without complicating features. Soft tissues were inspected and found to be in good repair. The wound was irrigated. A # 10 JP drain was inserted through a separate stab incision.  The platysma layer was closed with 3-0 Vicryl stitches and the skin was reapproximated with 3-0 Vicryl subcuticular stitches. The wound was dressed with Dermabond and an occlusive dressing. Counts were correct at the end of the case. Patient was  extubated and taken to recovery in stable and satisfactory condition.   PLAN OF CARE: Admit to inpatient   PATIENT DISPOSITION:  PACU - hemodynamically stable.   Delay start of Pharmacological VTE agent (>24hrs) due to surgical blood loss or risk of bleeding: yes

## 2018-08-30 NOTE — Progress Notes (Signed)
Patient alert and oriented, mae's well, voiding adequate amount of urine, swallowing without difficulty,slight swelling at incision site, no c/o pain at time of discharge. Patient discharged home with family. Script sent to pharmacy, and discharged instructions given to patient. Patient and family stated understanding of instructions given. Patient has an appointment with Dr. Vertell Limber.

## 2018-08-30 NOTE — Discharge Summary (Signed)
Physician Discharge Summary  Patient ID: Mariah Diaz MRN: [id number] DOB/AGE: [birth year] 58 y.o.  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 Vertell Limber who performed a C3 corpectomy and C2-4 plating.  She is doing nicely following surgery.  Her dressing is clean and dry.  She is up and ambulating actively.  She is voiding well.  We are discharging her home with instructions regarding wound care and activities.  She is going to contact the office to schedule appointment for follow-up with Dr. Vertell Limber.  Discharge Exam: Blood pressure (!) 155/96, pulse 74, temperature 98.1 F (36.7 C), temperature source Oral, resp. rate 16, height 5\' 6"  (1.676 m), weight 78.5 kg, SpO2 94 %.  Disposition: Discharge disposition: 01-Home or Self Care       Discharge Instructions    Discharge wound care:   Complete by:  As directed    Remove dressing on March 16 and leave the wound open to air. Shower daily with the wound uncovered (once the dressing is removed). Water and soapy water should run over the incision area. Do not wash directly on the incision for 2 weeks. Remove the glue after 2 weeks.   Driving Restrictions   Complete by:  As directed    No driving for 2 weeks. May ride in the car locally now. May begin to drive locally in 2 weeks.   Other Restrictions   Complete by:  As directed    Walk gradually increasing distances out in the fresh air at least twice a day. Walking additional 6 times inside the house, gradually increasing distances, daily. No bending, lifting, or  twisting. Perform activities between shoulder and waist height (that is at counter height when standing or table height when sitting).     Allergies as of 08/30/2018   No Known Allergies     Medication List    TAKE these medications   acetaminophen-codeine 300-30 MG tablet Commonly known as:  TYLENOL #3 Take 1 tablet by mouth every 4 (four) hours as needed for moderate pain.   Biotin 10000 MCG Tabs Take 10,000 mcg by mouth daily.   ibuprofen 600 MG tablet Commonly known as:  ADVIL,MOTRIN Take 1 tablet by mouth every 8 (eight) hours as needed.   lisinopril 20 MG tablet Commonly known as:  PRINIVIL,ZESTRIL Take 20 mg by mouth daily.   lovastatin 20 MG tablet Commonly known as:  MEVACOR Take 20 mg by mouth at bedtime.   oxyCODONE-acetaminophen 10-325 MG tablet Commonly known as:  PERCOCET Take 1 tablet by mouth every 8 (eight) hours as needed for pain. What changed:  Another medication with the same name was changed. Make sure you understand how and when to take each.   oxyCODONE-acetaminophen 5-325 MG tablet Commonly known as:  Roxicet 1 or 2 pills every 4-6 hours as needed for pain What changed:    how much to take  how to take this  when to take this  reasons to take this   sertraline 100 MG tablet Commonly known as:  ZOLOFT Take 200 mg by mouth daily.   traZODone 50 MG tablet Commonly known as:  DESYREL Take 50 mg by mouth  at bedtime.            Discharge Care Instructions  (From admission, onward)         Start     Ordered   08/30/18 0000  Discharge wound care:    Comments:  Remove dressing on March 16 and leave the wound open to air. Shower daily with the wound uncovered (once the dressing is removed). Water and soapy water should run over the incision area. Do not wash directly on the incision for 2 weeks. Remove the glue after 2 weeks.   08/30/18 0830         Follow-up Information    Erline Levine, MD Follow up.   Specialty:   Neurosurgery Contact information: [address] [place] [address] [phone]           Signed: Hosie Spangle 08/30/2018, 8:30 AM

## 2018-08-30 NOTE — Evaluation (Signed)
Physical Therapy Evaluation Patient Details Name: Mariah Diaz MRN: [id number] DOB: November 18, 1960 Today's Date: 08/30/2018   History of Present Illness  Patient is 58 yo female s/p Cervical 3 Corpectomy with Cervical 2 to Cervical 4 plate.  Clinical Impression  Patient seen for mobility assessment s/p spinal surgery. Mobilizing well. Educated patient on precautions, mobility expectations, safety and car transfers. No further acute PT needs. Will sign off.    Follow Up Recommendations No PT follow up    Equipment Recommendations  None recommended by PT    Recommendations for Other Services       Precautions / Restrictions Precautions Precautions: Cervical Precaution Booklet Issued: Yes (comment) Precaution Comments: reviewed verbally with patient Required Braces or Orthoses: Cervical Brace      Mobility  Bed Mobility Overal bed mobility: Modified Independent             General bed mobility comments: HOB elevated, no physical assist or cues required  Transfers Overall transfer level: Independent Equipment used: None                Ambulation/Gait Ambulation/Gait assistance: Independent Gait Distance (Feet): 360 Feet Assistive device: None Gait Pattern/deviations: WFL(Within Functional Limits)     General Gait Details: steady with ambulation  Stairs Stairs: Yes Stairs assistance: Supervision Stair Management: One rail Left Number of Stairs: 4 General stair comments: no difficulty, cued for technique  Wheelchair Mobility    Modified Rankin (Stroke Patients Only)       Balance Overall balance assessment: Independent                                           Pertinent Vitals/Pain Pain Assessment: Faces Faces Pain Scale: Hurts little more Pain Location: neck Pain Descriptors / Indicators: Sore Pain Intervention(s): Monitored during session    Home Living Family/patient expects to be discharged to:: Private residence     Type of Home: House Home Access: Stairs to enter Entrance Stairs-Rails: Can reach both Technical brewer of Steps: 2 Home Layout: One level Home Equipment: None      Prior Function Level of Independence: Independent               Hand Dominance   Dominant Hand: Right    Extremity/Trunk Assessment   Upper Extremity Assessment Upper Extremity Assessment: Overall WFL for tasks assessed    Lower Extremity Assessment Lower Extremity Assessment: Overall WFL for tasks assessed       Communication   Communication: No difficulties  Cognition Arousal/Alertness: Awake/alert Behavior During Therapy: WFL for tasks assessed/performed Overall Cognitive Status: Within Functional Limits for tasks assessed                                        General Comments      Exercises     Assessment/Plan    PT Assessment Patent does not need any further PT services  PT Problem List         PT Treatment Interventions      PT Goals (Current goals can be found in the Care Plan section)  Acute Rehab PT Goals Patient Stated Goal: to go home PT Goal Formulation: All assessment and education complete, DC therapy    Frequency     Barriers to discharge  Co-evaluation               AM-PAC PT "6 Clicks" Mobility  Outcome Measure Help needed turning from your back to your side while in a flat bed without using bedrails?: None Help needed moving from lying on your back to sitting on the side of a flat bed without using bedrails?: None Help needed moving to and from a bed to a chair (including a wheelchair)?: None Help needed standing up from a chair using your arms (e.g., wheelchair or bedside chair)?: None Help needed to walk in hospital room?: None Help needed climbing 3-5 steps with a railing? : A Little 6 Click Score: 23    End of Session   Activity Tolerance: Patient tolerated treatment well Patient left: in bed;with call bell/phone  within reach Nurse Communication: Mobility status PT Visit Diagnosis: Other symptoms and signs involving the nervous system (R29.898)    Time: 0710-0729 PT Time Calculation (min) (ACUTE ONLY): 19 min   Charges:   PT Evaluation $PT Eval Low Complexity: [place], PT DPT  Board Certified Neurologic Specialist Acute Rehabilitation Services Pager [phone] Office [place] 08/30/2018, 7:33 AM

## 2018-08-30 NOTE — Discharge Instructions (Signed)

## 2018-08-30 NOTE — Evaluation (Signed)
Occupational Therapy Evaluation Patient Details Name: Mariah Diaz MRN: [id number] DOB: January 08, 1961 Today's Date: 08/30/2018    History of Present Illness Patient is 58 yo female s/p Cervical 3 Corpectomy with Cervical 2 to Cervical 4 plate (N/A Spine Cervical).   Clinical Impression   Patient is s/p Cervical 3 Corpectomy with Cervical 2 to Cervical 4 plate surgery resulting in the deficits listed below (see OT Problem List). The patient was independent prior with ADLS but limited due to pain.  Patient lives alone but will have friends to assist if needed. The patient was able to voice 3/3 precautions and able to don and doff brace. It was recommended to get a reacher and long handle sponge when completing ADLS to be able to follow precautions when returning home. The patient at this time does not need any further skilled acute Occupational Therapy.     Follow Up Recommendations  No OT follow up;Supervision - Intermittent    Equipment Recommendations       Recommendations for Other Services       Precautions / Restrictions Precautions Precautions: Cervical Precaution Booklet Issued: Yes (comment) Precaution Comments: reviewed verbally with patient Required Braces or Orthoses: Cervical Brace Restrictions Weight Bearing Restrictions: No      Mobility Bed Mobility Overal bed mobility: Modified Independent             General bed mobility comments: HOB elevated, no physical assist or cues required  Transfers Overall transfer level: Independent Equipment used: None                  Balance Overall balance assessment: Independent                                         ADL either performed or assessed with clinical judgement   ADL Overall ADL's : Needs assistance/impaired Eating/Feeding: Independent   Grooming: Wash/dry hands;Independent   Upper Body Bathing: Modified independent;Sitting   Lower Body Bathing: Modified independent;Sit  to/from stand   Upper Body Dressing : Modified independent;Sitting;Standing   Lower Body Dressing: Modified independent   Toilet Transfer: Modified Independent;Regular Toilet   Toileting- Clothing Manipulation and Hygiene: Modified independent   Tub/ Banker: Walk-in shower;Modified independent;Shower seat   Functional mobility during ADLs: Modified independent       Vision Baseline Vision/History: No visual deficits Vision Assessment?: No apparent visual deficits     Perception Perception Perception Tested?: No   Praxis Praxis Praxis tested?: Within functional limits    Pertinent Vitals/Pain Pain Assessment: Faces Faces Pain Scale: Hurts little more Pain Location: neck Pain Descriptors / Indicators: Sore Pain Intervention(s): Monitored during session     Hand Dominance Right   Extremity/Trunk Assessment Upper Extremity Assessment Upper Extremity Assessment: Overall WFL for tasks assessed   Lower Extremity Assessment Lower Extremity Assessment: Overall WFL for tasks assessed   Cervical / Trunk Assessment Cervical / Trunk Assessment: Other exceptions   Communication Communication Communication: No difficulties   Cognition Arousal/Alertness: Awake/alert Behavior During Therapy: WFL for tasks assessed/performed Overall Cognitive Status: Within Functional Limits for tasks assessed                                     General Comments       Exercises     Shoulder Instructions  Home Living Family/patient expects to be discharged to:: Private residence Living Arrangements: Alone Available Help at Discharge: Friend(s) Type of Home: House Home Access: Stairs to enter Technical brewer of Steps: 2 Entrance Stairs-Rails: Can reach both Home Layout: One level     Bathroom Shower/Tub: Occupational psychologist: Handicapped height Bathroom Accessibility: Yes How Accessible: Accessible via walker Home Equipment:  None          Prior Functioning/Environment Level of Independence: Independent                 OT Problem List: Decreased knowledge of use of DME or AE;Decreased knowledge of precautions;Pain;Decreased strength      OT Treatment/Interventions:      OT Goals(Current goals can be found in the care plan section) Acute Rehab OT Goals Patient Stated Goal: to go home OT Goal Formulation: With patient Time For Goal Achievement: 09/06/18 Potential to Achieve Goals: Good  OT Frequency:     Barriers to D/C:            Co-evaluation              AM-PAC OT "6 Clicks" Daily Activity     Outcome Measure Help from another person eating meals?: None Help from another person taking care of personal grooming?: None Help from another person toileting, which includes using toliet, bedpan, or urinal?: None Help from another person bathing (including washing, rinsing, drying)?: None Help from another person to put on and taking off regular upper body clothing?: None Help from another person to put on and taking off regular lower body clothing?: None 6 Click Score: 24   End of Session Equipment Utilized During Treatment: Gait belt;Cervical collar  Activity Tolerance: Patient tolerated treatment well Patient left: in bed;with call bell/phone within reach  OT Visit Diagnosis: Unsteadiness on feet (R26.81);Pain Pain - part of body: (back)                Time: 0349-1791 OT Time Calculation (min): 13 min Charges:  OT General Charges $OT Visit: 1 Visit OT Evaluation $OT Eval Low Complexity: [place] OTR/L  Acute Rehab Services  [phone] office number [phone] pager number   Joeseph Amor 08/30/2018, 8:48 AM

## 2018-09-01 ENCOUNTER — Encounter (HOSPITAL_COMMUNITY): Payer: Self-pay | Admitting: Neurosurgery

## 2018-09-03 ENCOUNTER — Other Ambulatory Visit: Payer: Self-pay | Admitting: Medical Oncology

## 2018-09-03 ENCOUNTER — Telehealth: Payer: Self-pay | Admitting: Medical Oncology

## 2018-09-03 DIAGNOSIS — C3490 Malignant neoplasm of unspecified part of unspecified bronchus or lung: Secondary | ICD-10-CM

## 2018-09-03 NOTE — Telephone Encounter (Signed)
PET /MRI not authorized . Does she keep appt tomorrow for f/u

## 2018-09-03 NOTE — Telephone Encounter (Signed)
Left message to keep appt on Friday.

## 2018-09-04 ENCOUNTER — Telehealth: Payer: Self-pay | Admitting: Medical Oncology

## 2018-09-04 ENCOUNTER — Encounter: Payer: Self-pay | Admitting: *Deleted

## 2018-09-04 ENCOUNTER — Ambulatory Visit (HOSPITAL_COMMUNITY): Admission: RE | Admit: 2018-09-04 | Payer: Managed Care, Other (non HMO) | Source: Ambulatory Visit

## 2018-09-04 NOTE — Telephone Encounter (Signed)
LVM re  COVID  travel screen questions.

## 2018-09-04 NOTE — Progress Notes (Signed)
Oncology Nurse Navigator Documentation  Oncology Nurse Navigator Flowsheets 09/04/2018  Navigator Location [facility]  Referral date to RadOnc/MedOnc -  Navigator Encounter Type Other/per Dr. Julien Nordmann, I requested foundation one and PDL 1 on Mariah Diaz recent tissue [phone] from pathology dept   Treatment Phase -  Barriers/Navigation Needs Coordination of Care  Education -  Interventions Coordination of Care  Coordination of Care Other  Education Method -  Acuity Level 2  Time Spent with Patient 15

## 2018-09-05 ENCOUNTER — Inpatient Hospital Stay: Payer: Managed Care, Other (non HMO) | Admitting: Internal Medicine

## 2018-09-05 ENCOUNTER — Telehealth (HOSPITAL_COMMUNITY): Payer: Self-pay

## 2018-09-05 ENCOUNTER — Ambulatory Visit (HOSPITAL_COMMUNITY)
Admission: RE | Admit: 2018-09-05 | Discharge: 2018-09-05 | Disposition: A | Discharge status: Home / Self Care | Payer: Managed Care, Other (non HMO) | Source: Ambulatory Visit | Attending: Internal Medicine | Admitting: Internal Medicine

## 2018-09-05 DIAGNOSIS — C3490 Malignant neoplasm of unspecified part of unspecified bronchus or lung: Secondary | ICD-10-CM | POA: Diagnosis present

## 2018-09-05 MED ORDER — GADOBUTROL 1 MMOL/ML IV SOLN
10.0000 mL | Freq: Once | INTRAVENOUS | Status: AC | PRN
  Administered 2018-09-05: 10 mL via INTRAVENOUS

## 2018-09-08 ENCOUNTER — Other Ambulatory Visit: Payer: Self-pay | Admitting: Medical Oncology

## 2018-09-08 ENCOUNTER — Telehealth: Payer: Self-pay | Admitting: Medical Oncology

## 2018-09-08 ENCOUNTER — Encounter: Payer: Self-pay | Admitting: *Deleted

## 2018-09-08 ENCOUNTER — Encounter: Payer: Self-pay | Admitting: Internal Medicine

## 2018-09-08 NOTE — Telephone Encounter (Signed)
Low Back pain radiates to Right side.  Increased 8/10- took pain med at 0300 this am. Current pain med not controlling her pain. Pt instructed to contact surgeon. LVM to f/u with surgeon per [facility] and to  return my call

## 2018-09-08 NOTE — Progress Notes (Signed)
Oncology Nurse Navigator Documentation  Oncology Nurse Navigator Flowsheets 09/08/2018  Navigator Location [facility]  Referral date to RadOnc/MedOnc -  Navigator Encounter Type Other/I received a call from pathology that tissue obtained was bone and they are worried about molecular testing.  I updated Dr. Julien Nordmann, he would like foundation one and PDL 1 sent.  Pathology is updated and will send.   Treatment Phase -  Barriers/Navigation Needs Coordination of Care  Education -  Interventions Coordination of Care  Coordination of Care Other  Education Method -  Acuity Level 2  Time Spent with Patient 30

## 2018-09-09 ENCOUNTER — Ambulatory Visit (HOSPITAL_COMMUNITY): Payer: Managed Care, Other (non HMO)

## 2018-09-09 ENCOUNTER — Ambulatory Visit (HOSPITAL_COMMUNITY)
Admission: RE | Admit: 2018-09-09 | Discharge: 2018-09-09 | Disposition: A | Discharge status: Home / Self Care | Payer: Managed Care, Other (non HMO) | Source: Ambulatory Visit | Attending: Internal Medicine | Admitting: Internal Medicine

## 2018-09-09 ENCOUNTER — Other Ambulatory Visit: Payer: Self-pay

## 2018-09-09 ENCOUNTER — Telehealth: Payer: Self-pay

## 2018-09-09 DIAGNOSIS — C3490 Malignant neoplasm of unspecified part of unspecified bronchus or lung: Secondary | ICD-10-CM | POA: Diagnosis not present

## 2018-09-09 MED ORDER — IOHEXOL 300 MG/ML  SOLN
100.0000 mL | Freq: Once | INTRAMUSCULAR | Status: AC | PRN
  Administered 2018-09-09: 100 mL via INTRAVENOUS

## 2018-09-09 MED ORDER — IOHEXOL 300 MG/ML  SOLN
30.0000 mL | Freq: Once | INTRAMUSCULAR | Status: AC | PRN
  Administered 2018-09-09: 30 mL via ORAL

## 2018-09-09 MED ORDER — SODIUM CHLORIDE (PF) 0.9 % IJ SOLN
INTRAMUSCULAR | Status: AC
  Filled 2018-09-09: qty 50

## 2018-09-09 NOTE — Telephone Encounter (Signed)
Left detailed VM outlining visitor restrictions. This RN left direct number with any further questions/concerns. Loma Sousa, RN BSN

## 2018-09-09 NOTE — Progress Notes (Signed)
Histology and Location of Primary Cancer: suspicious lung cancer. 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   Sites of Visceral and Bony Metastatic Disease: This was followed by CT of the cervical spine without contrast on August 20, 2018 and that showed constellations of findings most compatible with right lung cancer with related C3 metastasis with vertebra plana.  There was C3 spinal stenosis and cord compression appears stable to the MRI performed the day before.  There was also right apical Pancoast type lung tumor estimated at 6.8 cm.  There was a small but suspicious right paratracheal and bilateral thoracic inlet lymph nodes.  Location(s) of Symptomatic Metastases: The patient mentions that she has been complaining of pain in the back of her neck for the last 3 months.  She had several studies done in the past including x-ray of the cervical spines that showed suspicious lesion at C3.  She had MRI of the cervical spine performed on August 19, 2018 that showed severe C3 compression fracture with 70% height loss and retropulsion resulting in severe spinal canal stenosis with faint cord edema.  MRI BRAIN W WO CONTRAST 09/06/18:  IMPRESSION: Scattered intracranial metastases involving the supratentorial and infratentorial brain as above. Overall, there are approximately 10 total lesions. Mild localized edema about a few lesions without significant mass effect. No associated hemorrhage.  Past/Anticipated chemotherapy by medical oncology, if any: None at this time.  Pain on a scale of 0-10 is: pt reports pain in lower back, rated 9/10.    If Spine Met(s), symptoms, if any, include:  Bowel/Bladder retention or incontinence (please describe): No  Numbness or weakness in extremities (please describe): pt reports occasional cramping sensation in right thigh.  Current Decadron regimen, if applicable: None  Ambulatory status?  Walker? Wheelchair?:  Pt is ambulatory at home. Mostly bed bound.  SAFETY ISSUES:  Prior radiation? No  Pacemaker/ICD? No  Possible current pregnancy? No  Is the patient on methotrexate? No  Current Complaints / other details:  Pt presents today for initial consult with Dr. Sondra Come for Radiation Oncology. Pt is unaccompanied. Pt with Procare collar in place. Pt needs a pain medication refill.   BP (!) 178/104 (BP Location: Left Arm, Patient Position: Sitting)   Pulse 95   Temp 98.8 F (37.1 C) (Oral)   Resp 18   Ht 5' 6"  (1.676 m)   SpO2 95%   BMI 27.93 kg/m   Wt Readings from Last 3 Encounters:  08/29/18 173 lb 1 oz (78.5 kg)  08/22/18 173 lb 1.6 oz (78.5 kg)  04/22/12 173 lb 2 oz (78.5 kg)   Loma Sousa, RN BSN

## 2018-09-10 ENCOUNTER — Ambulatory Visit
Admission: RE | Admit: 2018-09-10 | Discharge: 2018-09-10 | Disposition: A | Discharge status: Home / Self Care | Payer: Managed Care, Other (non HMO) | Source: Ambulatory Visit | Attending: Radiation Oncology | Admitting: Radiation Oncology

## 2018-09-10 ENCOUNTER — Other Ambulatory Visit: Payer: Self-pay | Admitting: Radiation Therapy

## 2018-09-10 ENCOUNTER — Encounter: Payer: Self-pay | Admitting: *Deleted

## 2018-09-10 ENCOUNTER — Encounter: Payer: Self-pay | Admitting: Radiation Oncology

## 2018-09-10 ENCOUNTER — Other Ambulatory Visit: Payer: Self-pay

## 2018-09-10 VITALS — BP 178/104 | HR 95 | Temp 98.8°F | Resp 18 | Ht 66.0 in

## 2018-09-10 DIAGNOSIS — C7951 Secondary malignant neoplasm of bone: Secondary | ICD-10-CM | POA: Diagnosis not present

## 2018-09-10 DIAGNOSIS — C787 Secondary malignant neoplasm of liver and intrahepatic bile duct: Secondary | ICD-10-CM | POA: Diagnosis not present

## 2018-09-10 DIAGNOSIS — R590 Localized enlarged lymph nodes: Secondary | ICD-10-CM | POA: Diagnosis not present

## 2018-09-10 DIAGNOSIS — C7949 Secondary malignant neoplasm of other parts of nervous system: Principal | ICD-10-CM

## 2018-09-10 DIAGNOSIS — M4802 Spinal stenosis, cervical region: Secondary | ICD-10-CM | POA: Diagnosis not present

## 2018-09-10 DIAGNOSIS — J90 Pleural effusion, not elsewhere classified: Secondary | ICD-10-CM | POA: Insufficient documentation

## 2018-09-10 DIAGNOSIS — C7931 Secondary malignant neoplasm of brain: Secondary | ICD-10-CM | POA: Diagnosis not present

## 2018-09-10 DIAGNOSIS — C3411 Malignant neoplasm of upper lobe, right bronchus or lung: Secondary | ICD-10-CM | POA: Insufficient documentation

## 2018-09-10 DIAGNOSIS — Z79899 Other long term (current) drug therapy: Secondary | ICD-10-CM | POA: Diagnosis not present

## 2018-09-10 DIAGNOSIS — Z87891 Personal history of nicotine dependence: Secondary | ICD-10-CM | POA: Insufficient documentation

## 2018-09-10 DIAGNOSIS — I700 Atherosclerosis of aorta: Secondary | ICD-10-CM | POA: Insufficient documentation

## 2018-09-10 DIAGNOSIS — C3490 Malignant neoplasm of unspecified part of unspecified bronchus or lung: Secondary | ICD-10-CM

## 2018-09-10 MED ORDER — OXYCODONE-ACETAMINOPHEN 5-325 MG PO TABS: 1.0000 | ORAL_TABLET | ORAL | 0 refills | Status: DC | PRN

## 2018-09-10 NOTE — Progress Notes (Signed)
Oncology Nurse Navigator Documentation  Oncology Nurse Navigator Flowsheets 09/10/2018  Navigator Location [facility]  Referral date to RadOnc/MedOnc -  Navigator Encounter Type Other/I followed up with Dr. Julien Nordmann regarding Mariah Diaz's schedule.  He would like to see her next week.  I completed a scheduling message.    Treatment Phase -  Barriers/Navigation Needs Coordination of Care  Education -  Interventions Coordination of Care  Coordination of Care Other  Education Method -  Acuity Level 2  Time Spent with Patient 15

## 2018-09-10 NOTE — Patient Instructions (Signed)
Coronavirus (COVID-19) Are you at risk?  Are you at risk for the Coronavirus (COVID-19)?  To be considered HIGH RISK for Coronavirus (COVID-19), you have to meet the following criteria:  . Traveled to [place], [place], [place], [place] or [place]; or in the [place] to [place], [place], [place], or [place]; and have fever, cough, and shortness of breath within the last 2 weeks of travel OR . Been in close contact with a person diagnosed with COVID-19 within the last 2 weeks and have fever, cough, and shortness of breath . IF YOU DO NOT MEET THESE CRITERIA, YOU ARE CONSIDERED LOW RISK FOR COVID-19.  What to do if you are HIGH RISK for COVID-19?  . If you are having a medical emergency, call 911. . Seek medical care right away. Before you go to a doctor's office, urgent care or emergency department, call ahead and tell them about your recent travel, contact with someone diagnosed with COVID-19, and your symptoms. You should receive instructions from your physician's office regarding next steps of care.  . When you arrive at healthcare provider, tell the healthcare staff immediately you have returned from visiting [place], [place], [place], [place] or [place]; or traveled in the [place] to [place], [place], [place], or [place]; in the last two weeks or you have been in close contact with a person diagnosed with COVID-19 in the last 2 weeks.   . Tell the health care staff about your symptoms: fever, cough and shortness of breath. . After you have been seen by a medical provider, you will be either: o Tested for (COVID-19) and discharged home on quarantine except to seek medical care if symptoms worsen, and asked to  - Stay home and avoid contact with others until you get your results (4-5 days)  - Avoid travel on public transportation if possible (such as bus, train, or airplane) or o Sent to the Emergency Department by EMS for evaluation, COVID-19 testing, and possible  admission depending on your condition and test results.  What to do if you are LOW RISK for COVID-19?  Reduce your risk of any infection by using the same precautions used for avoiding the common cold or flu:  . Wash your hands often with soap and warm water for at least 20 seconds.  If soap and water are not readily available, use an alcohol-based hand sanitizer with at least 60% alcohol.  . If coughing or sneezing, cover your mouth and nose by coughing or sneezing into the elbow areas of your shirt or coat, into a tissue or into your sleeve (not your hands). . Avoid shaking hands with others and consider head nods or verbal greetings only. . Avoid touching your eyes, nose, or mouth with unwashed hands.  . Avoid close contact with people who are sick. . Avoid places or events with large numbers of people in one location, like concerts or sporting events. . Carefully consider travel plans you have or are making. . If you are planning any travel outside or inside the [place], visit the CDC's Travelers' Health webpage for the latest health notices. . If you have some symptoms but not all symptoms, continue to monitor at home and seek medical attention if your symptoms worsen. . If you are having a medical emergency, call 911.   ADDITIONAL HEALTHCARE OPTIONS FOR PATIENTS  [facility] / e-Visit: [email or web address]         [facility]: [phone]  [facility]: [phone]                   [facility]: [phone]   

## 2018-09-10 NOTE — Progress Notes (Signed)
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 female who is presenting to the office today for evaluation of metastatic lung cancer. She presented to the ER 07/28/18 with 9 out of 10 neck pain, beginning 3 months prior and relieved by drinking alcohol. An MRI discovered a severe C3 compression fracture, suspicious for a pathologic fracture. A cervical spine CT scan without contrast followed on 08/20/18. This revealed a constellation of findings most compatible with right lung cancer related C3 metastasis with vertebra plana. C3 spinal stenosis and cord compression appeared stable to the MRI performed the previous day. Right apical Pancoast type lung tumor estimated at 6.8 cm. Small but suspicious right paratracheal and bilateral thoracic inlet lymph nodes.  She underwent C 3 corpectomy with plating C 2 -  C4 levels on 08/29/18 for her pathologic bone fracture and C3 vertebral body tumor. Pathology from this surgery revealed metastatic non-small cell carcinoma.   Brain MRI with and without contrast was performed on 09/05/18 which showed scattered intracranial metastases involving the supratentorial and infratentorial brain as above. Overall, there are approximately 10 total lesions. Mild localized edema about a few lesions without significant mass effect. No associated hemorrhage.  Most recently, she had a chest CT yesterday with contrast which showed a large central right upper lobe mass, which involves the right hilum and obstructs the central right upper lobe bronchus, consistent with primary bronchogenic carcinoma. Mild  mediastinal lymphadenopathy, consistent with metastatic disease. Large right pleural effusion, suspicious for malignant effusion. Diffuse liver metastases. Diffuse lytic bone metastases. Mild abdominal porta hepatis and retroperitoneal lymphadenopathy, consistent with metastatic disease.  she reports associated pain in the center of her back. she denies headaches, vision changes and any other symptoms. She denies weakness in her extremities, numbness or tingling. She denies any bowel of bladder incontinence, urinary retention.   PREVIOUS RADIATION THERAPY: No  PAST MEDICAL HISTORY:  has a past medical history of Depression, Heart murmur, Hypertension, and No pertinent past medical history.    PAST SURGICAL HISTORY: Past Surgical History:  Procedure Laterality Date   ANTERIOR CERVICAL CORPECTOMY N/A 08/29/2018   Procedure: Cervical 3 Corpectomy with Cervical 2 to Cervical 4 plate;  Surgeon: Erline Levine, MD;  Location: [facility];  Service: Neurosurgery;  Laterality: N/A;  Cervical 3 Corpectomy with Cervical 2 to Cervical 4 plate   KNEE ARTHROSCOPY WITH ANTERIOR CRUCIATE LIGAMENT (ACL) REPAIR  04/22/2012   Procedure: KNEE ARTHROSCOPY WITH ANTERIOR CRUCIATE LIGAMENT (ACL) REPAIR;  Surgeon: Hessie Dibble, MD;  Location: [facility];  Service: Orthopedics;  Laterality: Right;   TONSILLECTOMY      FAMILY HISTORY: family history is not on file.  SOCIAL HISTORY:  reports that she quit smoking about 16 years ago. She has never used smokeless tobacco. She reports current alcohol use of about 21.0 standard drinks of alcohol per week. She reports that she does not use drugs.  ALLERGIES: Patient has no known allergies.  MEDICATIONS:  Current Outpatient Medications  Medication Sig Dispense Refill   Biotin 10000 MCG TABS Take 10,000 mcg by mouth daily.     HYDROcodone-acetaminophen (NORCO) 10-325 MG  tablet Take 1 tablet by mouth every 6 (six) hours as needed.     ibuprofen  (ADVIL,MOTRIN) 600 MG tablet Take 1 tablet by mouth every 8 (eight) hours as needed.     lisinopril (PRINIVIL,ZESTRIL) 20 MG tablet Take 20 mg by mouth daily.      lovastatin (MEVACOR) 20 MG tablet Take 20 mg by mouth at bedtime.     sertraline (ZOLOFT) 100 MG tablet Take 200 mg by mouth daily.      traZODone (DESYREL) 50 MG tablet Take 50 mg by mouth at bedtime.     acetaminophen-codeine (TYLENOL #3) 300-30 MG tablet Take 1 tablet by mouth every 4 (four) hours as needed for moderate pain.     oxyCODONE-acetaminophen (PERCOCET) 10-325 MG tablet Take 1 tablet by mouth every 8 (eight) hours as needed for pain.     oxyCODONE-acetaminophen (ROXICET) 5-325 MG per tablet 1 or 2 pills every 4-6 hours as needed for pain (Patient not taking: Reported on 09/10/2018) 50 tablet 0   No current facility-administered medications for this encounter.     REVIEW OF SYSTEMS:  A 10+ POINT REVIEW OF SYSTEMS WAS OBTAINED including neurology, dermatology, psychiatry, cardiac, respiratory, lymph, extremities, GI, GU, musculoskeletal, constitutional, reproductive, HEENT. All pertinent positives are noted in the HPI. All others are negative.    PHYSICAL EXAM:  height is 5\' 6"  (1.676 m). Her oral temperature is 98.8 F (37.1 C). Her blood pressure is 178/104 (abnormal) and her pulse is 95. Her respiration is 18 and oxygen saturation is 95%.   General: Alert and oriented, in no acute distress HEENT: Head is normocephalic. Extraocular movements are intact. Oropharynx is clear. Neck: Neck is supple, no palpable cervical or supraclavicular lymphadenopathy. [place] collar in place Heart: Regular in rate and rhythm with no murmurs, rubs, or gallops. Chest: Clear to auscultation bilaterally, with no rhonchi, wheezes, or rales. Abdomen: Soft, nontender, nondistended, with no rigidity or guarding. Extremities: No cyanosis or edema. Lymphatics: see Neck Exam Skin: No concerning lesions. Musculoskeletal: symmetric  strength and muscle tone throughout. Neurologic: Cranial nerves II through XII are grossly intact. No obvious focalities. Speech is fluent. Coordination is intact. Psychiatric: Judgment and insight are intact. Affect is appropriate.   ECOG = 1  0 - Asymptomatic (Fully active, able to carry on all predisease activities without restriction)  1 - Symptomatic but completely ambulatory (Restricted in physically strenuous activity but ambulatory and able to carry out work of a light or sedentary nature. For example, light housework, office work)  2 - Symptomatic, <50% in bed during the day (Ambulatory and capable of all self care but unable to carry out any work activities. Up and about more than 50% of waking hours)  3 - Symptomatic, >50% in bed, but not bedbound (Capable of only limited self-care, confined to bed or chair 50% or more of waking hours)  4 - Bedbound (Completely disabled. Cannot carry on any self-care. Totally confined to bed or chair)  5 - Death   Eustace Pen MM, Creech RH, Tormey DC, et al. [phone]). "Toxicity and response criteria of the [facility]". [place] Oncol. 5 (6): 649-55  LABORATORY DATA:  Lab Results  Component Value Date   WBC 12.1 (H) 08/22/2018   HGB 13.5 08/22/2018   HCT 41.1 08/22/2018   MCV 86.2 08/22/2018   PLT 302 08/22/2018   NEUTROABS 9.1 (H) 08/22/2018   Lab Results  Component Value Date   NA 131 (L) 08/22/2018   K 4.0  08/22/2018   CL 100 08/22/2018   CO2 21 (L) 08/22/2018   GLUCOSE 113 (H) 08/22/2018   CREATININE 0.81 08/22/2018   CALCIUM 9.4 08/22/2018      RADIOGRAPHY: Dg Cervical Spine 1 View  Result Date: 08/29/2018 CLINICAL DATA:  C3 corpectomy with C2 through C4 plate. EXAM: DG CERVICAL SPINE - 1 VIEW; DG C-ARM 61-120 MIN COMPARISON:  None. FINDINGS: Intraoperative lateral view of the cervical spine status post C3 corpectomy with anterior cervical plate and screw fixation from C2 through C4 is identified. A total  of 18 seconds of fluoroscopic time was utilized. Fine bony detail is limited due to the C-arm fluoroscopic technique. No immediate intraoperative complications are apparent. IMPRESSION: C3 corpectomy with anterior cervical fusion from C2 through C4. A total of 18 seconds of fluoroscopic time was utilized. Electronically Signed   By: Ashley Royalty M.D.   On: 08/29/2018 19:05   Ct Chest W Contrast  Result Date: 09/09/2018 CLINICAL DATA:  Newly diagnosed right lung non-small-cell carcinoma. Staging. EXAM: CT CHEST, ABDOMEN, AND PELVIS WITH CONTRAST TECHNIQUE: Multidetector CT imaging of the chest, abdomen and pelvis was performed following the standard protocol during bolus administration of intravenous contrast. CONTRAST:  125mL OMNIPAQUE IOHEXOL 300 MG/ML SOLN, 44mL OMNIPAQUE IOHEXOL 300 MG/ML SOLN COMPARISON:  None. FINDINGS: CT CHEST FINDINGS Cardiovascular: No acute findings. Mediastinum/Lymph Nodes: Mild mediastinal lymphadenopathy is seen in the pre-vascular, lateral aortic, right paratracheal, AP window, and subcarinal regions. Largest site in the subcarinal region measures 2.0 cm in short axis. Lungs/Pleura: Masslike opacity is seen in the central right upper lobe, which involves the right hilum and obstructs the right upper lobe bronchus. This measures approximately 7.3 x 5.0 cm,, but is difficult to differentiate from postobstructive collapse. A large right pleural effusion also results in compressive atelectasis. Musculoskeletal:  No suspicious bone lesions identified. CT ABDOMEN AND PELVIS FINDINGS Hepatobiliary: Multiple hypovascular masses are seen throughout the right and left lobes measuring up to 3 cm, consistent with diffuse liver metastases. Tiny calcified gallstone is noted. No evidence of cholecystitis or biliary ductal dilatation Pancreas:  No mass or inflammatory changes. Spleen:  Within normal limits in size and appearance. Adrenals/Urinary tract: Normal appearance of both adrenal glands. No  evidence of renal masses or hydronephrosis. Unremarkable unopacified urinary bladder. Stomach/Bowel: No evidence of obstruction, inflammatory process, or abnormal fluid collections. Vascular/Lymphatic: Mild lymphadenopathy is seen in the porta hepatis, with largest lymph node measuring 1.4 cm. Multiple small sub-cm lymph nodes are seen abdominal retroperitoneum in the left paraaortic and aortocaval spaces. No pelvic lymphadenopathy identified. Aortic atherosclerosis. Reproductive:  No mass or other significant abnormality identified. Other:  None. Musculoskeletal: Numerous lytic bone metastases are seen throughout the spine, pelvis, and involving several anterior right ribs. IMPRESSION: 1. Large central right upper lobe mass, which involves the right hilum and obstructs the central right upper lobe bronchus, consistent with primary bronchogenic carcinoma. 2. Mild mediastinal lymphadenopathy, consistent with metastatic disease. 3. Large right pleural effusion, suspicious for malignant effusion. 4. Diffuse liver metastases. 5. Diffuse lytic bone metastases. 6. Mild abdominal porta hepatis and retroperitoneal lymphadenopathy, consistent with metastatic disease. Electronically Signed   By: Earle Gell M.D.   On: 09/09/2018 21:36   Ct Cervical Spine Wo Contrast  Result Date: 08/21/2018 CLINICAL DATA:  58 year old female discovered to have severe C3 compression fracture on recent MRI, suspicious for a pathologic fracture. EXAM: CT CERVICAL SPINE WITHOUT CONTRAST TECHNIQUE: Multidetector CT imaging of the cervical spine was performed without intravenous contrast. Multiplanar CT  image reconstructions were also generated. COMPARISON:  MRI 08/19/2018. FINDINGS: Alignment: Stable from the recent MRI. Mild retropulsion and slight left retrolisthesis about the abnormal C3 vertebra. Posterior elements remain normally aligned. Subtle degenerative appearing anterolisthesis of C7 on T1. Visualized skull base is intact. No  atlanto-occipital dissociation. Skull base and vertebrae: There is near vertebra plana of C3, and portions of the vertebra are eroded/destroyed, most notably the right C3 pedicle and facet as demonstrated on sagittal image 27 and series 4, image 39. Bone mineralization at the visible skull base appears normal. No other cervical vertebral bone lesion is identified. The visible upper thoracic vertebrae and ribs also appear intact, but the right upper lung is abnormal as described below. Soft tissues and spinal canal: C3 level spinal stenosis with cord compression was better demonstrated on the recent MRI. There are small but conspicuous 7-8 millimeter lymph nodes at the bilateral thoracic inlet/level 4. above the thoracic inlet the noncontrast neck soft tissues appear within normal limits. Disc levels: Better demonstrated on the recent MRI, including C3 spinal stenosis and cord compression. Upper chest: There is a right apical lung mass with superimposed small right pleural effusion. The mass is inseparable from the right paratracheal mediastinum and measures at least 68 x 66 x 46 millimeters (AP by transverse by CC). Some centrilobular emphysema is evident in the left upper lung. There are small but conspicuous right paratracheal lymph nodes. Other findings: Visualized paranasal sinuses and mastoids are well pneumatized. Negative visible noncontrast posterior fossa. IMPRESSION: 1. Constellation of findings most compatible with right lung cancer related C3 metastasis with vertebra plana. C3 spinal stenosis and cord compression appears stable to the MRI yesterday. 2. Right apical Pancoast type lung tumor estimated at 6.8 cm. Small but suspicious right paratracheal and bilateral thoracic inlet lymph nodes. 3. No other cervical or upper thoracic bone metastasis identified by CT. These results will be called to the ordering clinician or representative by the Radiologist Assistant, and communication documented in the PACS  or zVision Dashboard. Electronically Signed   By: Genevie Ann M.D.   On: 08/21/2018 08:38   Mr Jeri Cos GN Contrast  Result Date: 09/06/2018 CLINICAL DATA:  Initial evaluation for lung cancer, staging, known metastatic lesion to cervical spine. EXAM: MRI HEAD WITHOUT AND WITH CONTRAST TECHNIQUE: Multiplanar, multiecho pulse sequences of the brain and surrounding structures were obtained without and with intravenous contrast. CONTRAST:  10 cc of Gadavist.: COMPARISON:  Comparison made with prior MRI and CT of the cervical spine from 08/19/2018 and 08/20/2018. FINDINGS: Brain: Cerebral volume within normal limits for age. No evidence for acute or subacute infarct. Gray-white matter differentiation maintained without evidence for remote cortical infarction. No foci of susceptibility artifact to suggest acute or chronic intracranial hemorrhage. No hydrocephalus. No extra-axial fluid collection. Pituitary gland suprasellar region normal. Midline structures intact and normal. Multiple predominantly subcentimeter rim enhancing lesions seen scattered throughout the supratentorial and infratentorial brain, compatible with intracranial metastatic disease. Overall, there are approximately 10 lesions. Largest lesion within the left cerebral hemisphere positioned at the anterior mesial left temporal lobe in measures 11 mm (series 11, image 53). Largest lesion within the right cerebral hemisphere positioned at the high parasagittal right frontal lobe in measures 6 mm (series 11, image 132). Largest infratentorial lesion involves the right paramedian inferior pons and measures 6 mm (series 11, image 37). Mild localized vasogenic edema seen about a few lesions without significant regional mass effect. Associated edema seen extending through the right cerebral peduncle along  the course of the corticospinal tract related to the brainstem lesions. No associated hemorrhage. No midline shift. Vascular: Major intracranial vascular flow  voids are maintained. Skull and upper cervical spine: Craniocervical junction within normal limits. Sequelae of interval ACDF at C3-4 for known C3 metastasis noted. No other focal marrow replacing lesions. No scalp soft tissue abnormality. Sinuses/Orbits: Globes and orbital soft tissues within normal limits. Paranasal sinuses are largely clear. No significant mastoid effusion. Fluid-filled air cell noted superior to the right internal auditory canal. Other: None. IMPRESSION: Scattered intracranial metastases involving the supratentorial and infratentorial brain as above. Overall, there are approximately 10 total lesions. Mild localized edema about a few lesions without significant mass effect. No associated hemorrhage. Electronically Signed   By: Jeannine Boga M.D.   On: 09/06/2018 01:17   Ct Abdomen Pelvis W Contrast  Result Date: 09/09/2018 CLINICAL DATA:  Newly diagnosed right lung non-small-cell carcinoma. Staging. EXAM: CT CHEST, ABDOMEN, AND PELVIS WITH CONTRAST TECHNIQUE: Multidetector CT imaging of the chest, abdomen and pelvis was performed following the standard protocol during bolus administration of intravenous contrast. CONTRAST:  15mL OMNIPAQUE IOHEXOL 300 MG/ML SOLN, 81mL OMNIPAQUE IOHEXOL 300 MG/ML SOLN COMPARISON:  None. FINDINGS: CT CHEST FINDINGS Cardiovascular: No acute findings. Mediastinum/Lymph Nodes: Mild mediastinal lymphadenopathy is seen in the pre-vascular, lateral aortic, right paratracheal, AP window, and subcarinal regions. Largest site in the subcarinal region measures 2.0 cm in short axis. Lungs/Pleura: Masslike opacity is seen in the central right upper lobe, which involves the right hilum and obstructs the right upper lobe bronchus. This measures approximately 7.3 x 5.0 cm,, but is difficult to differentiate from postobstructive collapse. A large right pleural effusion also results in compressive atelectasis. Musculoskeletal:  No suspicious bone lesions identified. CT  ABDOMEN AND PELVIS FINDINGS Hepatobiliary: Multiple hypovascular masses are seen throughout the right and left lobes measuring up to 3 cm, consistent with diffuse liver metastases. Tiny calcified gallstone is noted. No evidence of cholecystitis or biliary ductal dilatation Pancreas:  No mass or inflammatory changes. Spleen:  Within normal limits in size and appearance. Adrenals/Urinary tract: Normal appearance of both adrenal glands. No evidence of renal masses or hydronephrosis. Unremarkable unopacified urinary bladder. Stomach/Bowel: No evidence of obstruction, inflammatory process, or abnormal fluid collections. Vascular/Lymphatic: Mild lymphadenopathy is seen in the porta hepatis, with largest lymph node measuring 1.4 cm. Multiple small sub-cm lymph nodes are seen abdominal retroperitoneum in the left paraaortic and aortocaval spaces. No pelvic lymphadenopathy identified. Aortic atherosclerosis. Reproductive:  No mass or other significant abnormality identified. Other:  None. Musculoskeletal: Numerous lytic bone metastases are seen throughout the spine, pelvis, and involving several anterior right ribs. IMPRESSION: 1. Large central right upper lobe mass, which involves the right hilum and obstructs the central right upper lobe bronchus, consistent with primary bronchogenic carcinoma. 2. Mild mediastinal lymphadenopathy, consistent with metastatic disease. 3. Large right pleural effusion, suspicious for malignant effusion. 4. Diffuse liver metastases. 5. Diffuse lytic bone metastases. 6. Mild abdominal porta hepatis and retroperitoneal lymphadenopathy, consistent with metastatic disease. Electronically Signed   By: Earle Gell M.D.   On: 09/09/2018 21:36   Dg C-arm 1-60 Min  Result Date: 08/29/2018 CLINICAL DATA:  C3 corpectomy with C2 through C4 plate. EXAM: DG CERVICAL SPINE - 1 VIEW; DG C-ARM 61-120 MIN COMPARISON:  None. FINDINGS: Intraoperative lateral view of the cervical spine status post C3 corpectomy  with anterior cervical plate and screw fixation from C2 through C4 is identified. A total of 18 seconds of fluoroscopic time was utilized.  Fine bony detail is limited due to the C-arm fluoroscopic technique. No immediate intraoperative complications are apparent. IMPRESSION: C3 corpectomy with anterior cervical fusion from C2 through C4. A total of 18 seconds of fluoroscopic time was utilized. Electronically Signed   By: Ashley Royalty M.D.   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 The patient's neck pain is essentially resolved since her surgery. She denies any weakness in her extremities, or numbness, bowel incontinence. Her major complaint today is mid back pain, likely related to her lumbar/lower thoracic spine metastasis. I reviewed the pt's brain MRI and we discussed that she potentially could be considered for [facility] treatment to this area, given her excellent performance status at this point. We discussed whole brain vs. consideration for [facility] for treatment to her brain lesions. She will proceed with a 3T MRI for further evaluation, realizing that she may not be a candidate for SRS brain treatment if this new scan shows additional lesions. I would recommend post-op radiation therapy to the cervical spine. The pt's case was presented at the neurooncology conference and conventional post-op radiation therapy was recommended for treatment to this area. Pt has a large lesion in the right upper lobe, which has resulted in post-obstructive changes with partial collapse of the right upper lobe. May need to consider palliative radiation treatments to this region for this issue. Conceivably, the pt may need 4 areas treated, that being the brain, cervical spine, right upper lung mass, and lower thoracic/upper lumbar spine area.  Today, I talked  to the patient about the findings and work-up thus far.  We discussed the natural history of her condition and general treatment, highlighting the role of radiotherapy in the management.  We discussed the available radiation techniques, and focused on the details of logistics and delivery.  We reviewed the anticipated acute and late sequelae associated with radiation in this setting.  The patient was encouraged to ask questions that I answered to the best of my ability.     PLAN: Pt is being scheduled for a 3T brain MRI and will likely meet with Drs. Vertell Limber and Clayton afterward unless brain MRI shows several additional lesions where whole brain XRT would be indicated. She will be scheduled for treatments to the cervical spine,  right upper lung mass and lower thoracic/lumbar spine area once a decision on SRS is complete.   ------------------------------------------------  Blair Promise, PhD, MD This document serves as a record of services personally performed by Gery Pray, MD. It was created on his behalf by Mary-Margaret Loma Messing, a trained medical scribe. The creation of this record is based on the scribe's personal observations and the provider's statements to them. This document has been checked and approved by the attending provider.

## 2018-09-11 ENCOUNTER — Other Ambulatory Visit: Payer: Self-pay | Admitting: Radiation Oncology

## 2018-09-11 MED ORDER — OXYCODONE-ACETAMINOPHEN 10-325 MG PO TABS: 1.0000 | ORAL_TABLET | ORAL | 0 refills | Status: DC | PRN

## 2018-09-12 ENCOUNTER — Encounter: Payer: Self-pay | Admitting: General Practice

## 2018-09-12 ENCOUNTER — Encounter: Payer: Self-pay | Admitting: *Deleted

## 2018-09-12 NOTE — Progress Notes (Signed)
[place] Psychosocial Distress Screening Clinical Social Work  Clinical Social Work was referred by distress screening protocol.  The patient scored a 5 on the Psychosocial Distress Thermometer which indicates moderate distress. Clinical Social Worker contacted patient by phone to assess for distress and other psychosocial needs. Left VM w information about [place] and resources available and contact information - encouraged her to call if needs arise.  ONCBCN DISTRESS SCREENING 09/10/2018  Screening Type Initial Screening  Distress experienced in past week (1-10) 5  Emotional problem type Nervousness/Anxiety;Isolation/feeling alone  Information Concerns Type Lack of info about diagnosis  Physical Problem type Pain    Clinical Social Worker follow up needed: No.  If yes, follow up plan:  Beverely Pace, [place], LCSW Clinical Social Worker Phone:  [phone]

## 2018-09-12 NOTE — Progress Notes (Signed)
Oncology Nurse Navigator Documentation  Oncology Nurse Navigator Flowsheets 09/12/2018  Navigator Location [facility]  Referral date to RadOnc/MedOnc -  Navigator Encounter Type Other/I followed up on Mariah Diaz schedule and updated Dr. Julien Nordmann. He would like to see her next week.  I completed a scheduling message for her to be called and scheduled with either Dr. Julien Nordmann or APP next week.   Abnormal Finding Date 08/20/2018  Confirmed Diagnosis Date 08/29/2018  Surgery Date 08/29/2018  Treatment Initiated Date 08/29/2018  Treatment Phase -  Barriers/Navigation Needs Coordination of Care  Education -  Interventions Coordination of Care  Coordination of Care Other  Education Method -  Acuity Level 2  Time Spent with Patient 30

## 2018-09-15 ENCOUNTER — Ambulatory Visit
Admission: RE | Admit: 2018-09-15 | Discharge: 2018-09-15 | Disposition: A | Discharge status: Home / Self Care | Payer: Managed Care, Other (non HMO) | Source: Ambulatory Visit | Attending: Radiation Oncology | Admitting: Radiation Oncology

## 2018-09-15 ENCOUNTER — Other Ambulatory Visit: Payer: Self-pay

## 2018-09-15 DIAGNOSIS — C7949 Secondary malignant neoplasm of other parts of nervous system: Principal | ICD-10-CM

## 2018-09-15 DIAGNOSIS — C7931 Secondary malignant neoplasm of brain: Secondary | ICD-10-CM

## 2018-09-15 MED ORDER — GADOBENATE DIMEGLUMINE 529 MG/ML IV SOLN
15.0000 mL | Freq: Once | INTRAVENOUS | Status: AC | PRN
  Administered 2018-09-15: 15 mL via INTRAVENOUS

## 2018-09-16 ENCOUNTER — Other Ambulatory Visit: Payer: Self-pay | Admitting: Radiation Oncology

## 2018-09-16 MED ORDER — OXYCODONE-ACETAMINOPHEN 10-325 MG PO TABS: 1.0000 | ORAL_TABLET | ORAL | 0 refills | Status: DC | PRN

## 2018-09-16 MED ORDER — MORPHINE SULFATE ER 30 MG PO TBCR: 30.0000 mg | EXTENDED_RELEASE_TABLET | Freq: Two times a day (BID) | ORAL | 0 refills | Status: DC

## 2018-09-16 MED ORDER — MORPHINE SULFATE 15 MG PO TABS: 15.0000 mg | ORAL_TABLET | ORAL | 0 refills | Status: DC | PRN

## 2018-09-17 ENCOUNTER — Other Ambulatory Visit: Payer: Self-pay | Admitting: Medical Oncology

## 2018-09-17 ENCOUNTER — Telehealth: Payer: Self-pay | Admitting: Medical Oncology

## 2018-09-17 DIAGNOSIS — C3490 Malignant neoplasm of unspecified part of unspecified bronchus or lung: Secondary | ICD-10-CM

## 2018-09-17 NOTE — Telephone Encounter (Signed)
email is " [email or web address]"

## 2018-09-18 ENCOUNTER — Other Ambulatory Visit: Payer: Self-pay

## 2018-09-18 ENCOUNTER — Inpatient Hospital Stay: Payer: Managed Care, Other (non HMO) | Attending: Internal Medicine | Admitting: Internal Medicine

## 2018-09-18 ENCOUNTER — Telehealth: Payer: Self-pay | Admitting: Medical Oncology

## 2018-09-18 ENCOUNTER — Other Ambulatory Visit: Payer: Managed Care, Other (non HMO)

## 2018-09-18 DIAGNOSIS — Z7951 Long term (current) use of inhaled steroids: Secondary | ICD-10-CM

## 2018-09-18 DIAGNOSIS — Z923 Personal history of irradiation: Secondary | ICD-10-CM | POA: Diagnosis not present

## 2018-09-18 DIAGNOSIS — C7931 Secondary malignant neoplasm of brain: Secondary | ICD-10-CM

## 2018-09-18 DIAGNOSIS — C3490 Malignant neoplasm of unspecified part of unspecified bronchus or lung: Secondary | ICD-10-CM

## 2018-09-18 DIAGNOSIS — C3491 Malignant neoplasm of unspecified part of right bronchus or lung: Secondary | ICD-10-CM

## 2018-09-18 DIAGNOSIS — C3411 Malignant neoplasm of upper lobe, right bronchus or lung: Secondary | ICD-10-CM

## 2018-09-18 DIAGNOSIS — C7951 Secondary malignant neoplasm of bone: Secondary | ICD-10-CM

## 2018-09-18 NOTE — Telephone Encounter (Signed)
No answer

## 2018-09-18 NOTE — Progress Notes (Signed)
Virtual Visit via Video Note  I connected with [name] on 09/18/18 at 12:00 PM EDT by a video enabled telemedicine application and verified that I am speaking with the correct person using two identifiers.   I discussed the limitations of evaluation and management by telemedicine and the availability of in person appointments. The patient expressed understanding and agreed to proceed.      [place] [phone]   [phone]  OFFICE PROGRESS NOTE  Mariah Melter, MD [address] [place] [address]  DIAGNOSIS: Stage IV (T3, N2, M1 C) non-small cell lung cancer, adenosquamous carcinoma presented with large central right upper lobe lung mass involving the right hilum and obstructive the right upper lobe bronchus in addition to mediastinal lymphadenopathy, large right pleural effusion, diffuse liver metastasis as well as diffuse lytic bone metastasis and abdominal and retroperitoneal lymphadenopathy as well as multiple brain metastasis diagnosed in March 2020. Molecular studies are still pending.  PRIOR THERAPY: Status post cervical 3 corpectomy with cervical 2 to cervical for plate under the care of Dr. Vertell Limber on August 29, 2018.  CURRENT THERAPY: The patient is expected to start whole brain irradiation as well as palliative radiotherapy to the metastatic disease in the neck area under the care of Dr. Sondra Come soon.  History of Present Illness: Mariah Diaz 58 y.o. female had a virtual video follow-up visit.  The patient is feeling fine today with no concerning complaints.  She is wearing a neck collar.  She was accompanied by friend.  She denied having any current chest pain but has shortness of breath with exertion with persistent dry cough and no hemoptysis.  She lost few pounds recently secondary to lack of appetite.  She is not on dexamethasone anymore.  She denied having any fever or chills.  She has no headache or visual  changes.  He denied having any nausea, vomiting, diarrhea or constipation.  She had several studies and procedures done since her last visit with me including CT scan of the chest, abdomen and pelvis as well as brain MRI in addition to the neck procedure under the care of Dr. Vertell Limber.  We have the visit today for evaluation and discussion of her treatment options based on the recent studies.  MEDICAL HISTORY: Past Medical History:  Diagnosis Date   Depression    Heart murmur    has not had in checked in "years"   Hypertension    No pertinent past medical history     ALLERGIES:  has No Known Allergies.  MEDICATIONS:  Current Outpatient Medications  Medication Sig Dispense Refill   Biotin 10000 MCG TABS Take 10,000 mcg by mouth daily.     ibuprofen (ADVIL,MOTRIN) 600 MG tablet Take 1 tablet by mouth every 8 (eight) hours as needed.     lisinopril (PRINIVIL,ZESTRIL) 20 MG tablet Take 20 mg by mouth daily.      lovastatin (MEVACOR) 20 MG tablet Take 20 mg by mouth at bedtime.     morphine (MS CONTIN) 30 MG 12 hr tablet Take 1 tablet (30 mg total) by mouth every 12 (twelve) hours. 30 tablet 0   morphine (MSIR) 15 MG tablet Take 1 tablet (15 mg total) by mouth every 4 (four) hours as needed for severe pain. 30 tablet 0   oxyCODONE-acetaminophen (PERCOCET) 10-325 MG tablet Take 1 tablet by mouth every 4 (four) hours as needed for pain. 30 tablet 0   sertraline (ZOLOFT) 100 MG tablet  Take 200 mg by mouth daily.      traZODone (DESYREL) 50 MG tablet Take 50 mg by mouth at bedtime.     No current facility-administered medications for this visit.     SURGICAL HISTORY:  Past Surgical History:  Procedure Laterality Date   ANTERIOR CERVICAL CORPECTOMY N/A 08/29/2018   Procedure: Cervical 3 Corpectomy with Cervical 2 to Cervical 4 plate;  Surgeon: Erline Levine, MD;  Location: [facility];  Service: Neurosurgery;  Laterality: N/A;  Cervical 3 Corpectomy with Cervical 2 to Cervical 4 plate    KNEE ARTHROSCOPY WITH ANTERIOR CRUCIATE LIGAMENT (ACL) REPAIR  04/22/2012   Procedure: KNEE ARTHROSCOPY WITH ANTERIOR CRUCIATE LIGAMENT (ACL) REPAIR;  Surgeon: Hessie Dibble, MD;  Location: [facility];  Service: Orthopedics;  Laterality: Right;   TONSILLECTOMY      REVIEW OF SYSTEMS:  Constitutional: positive for anorexia, fatigue and weight loss Eyes: negative Ears, nose, mouth, throat, and face: negative Respiratory: positive for cough and dyspnea on exertion Cardiovascular: negative Gastrointestinal: negative Genitourinary:negative Integument/breast: negative Hematologic/lymphatic: negative Musculoskeletal:negative Neurological: negative Behavioral/Psych: negative Endocrine: negative Allergic/Immunologic: negative   LABORATORY DATA: Lab Results  Component Value Date   WBC 12.1 (H) 08/22/2018   HGB 13.5 08/22/2018   HCT 41.1 08/22/2018   MCV 86.2 08/22/2018   PLT 302 08/22/2018      Chemistry      Component Value Date/Time   NA 131 (L) 08/22/2018 1052   K 4.0 08/22/2018 1052   CL 100 08/22/2018 1052   CO2 21 (L) 08/22/2018 1052   BUN 7 08/22/2018 1052   CREATININE 0.81 08/22/2018 1052      Component Value Date/Time   CALCIUM 9.4 08/22/2018 1052   ALKPHOS 150 (H) 08/22/2018 1052   AST 22 08/22/2018 1052   ALT 23 08/22/2018 1052   BILITOT 0.1 (L) 08/22/2018 1052       RADIOGRAPHIC STUDIES: Dg Cervical Spine 1 View  Result Date: 08/29/2018 CLINICAL DATA:  C3 corpectomy with C2 through C4 plate. EXAM: DG CERVICAL SPINE - 1 VIEW; DG C-ARM 61-120 MIN COMPARISON:  None. FINDINGS: Intraoperative lateral view of the cervical spine status post C3 corpectomy with anterior cervical plate and screw fixation from C2 through C4 is identified. A total of 18 seconds of fluoroscopic time was utilized. Fine bony detail is limited due to the C-arm fluoroscopic technique. No immediate intraoperative complications are apparent. IMPRESSION: C3 corpectomy with  anterior cervical fusion from C2 through C4. A total of 18 seconds of fluoroscopic time was utilized. Electronically Signed   By: Ashley Royalty M.D.   On: 08/29/2018 19:05   Ct Chest W Contrast  Result Date: 09/09/2018 CLINICAL DATA:  Newly diagnosed right lung non-small-cell carcinoma. Staging. EXAM: CT CHEST, ABDOMEN, AND PELVIS WITH CONTRAST TECHNIQUE: Multidetector CT imaging of the chest, abdomen and pelvis was performed following the standard protocol during bolus administration of intravenous contrast. CONTRAST:  176mL OMNIPAQUE IOHEXOL 300 MG/ML SOLN, 47mL OMNIPAQUE IOHEXOL 300 MG/ML SOLN COMPARISON:  None. FINDINGS: CT CHEST FINDINGS Cardiovascular: No acute findings. Mediastinum/Lymph Nodes: Mild mediastinal lymphadenopathy is seen in the pre-vascular, lateral aortic, right paratracheal, AP window, and subcarinal regions. Largest site in the subcarinal region measures 2.0 cm in short axis. Lungs/Pleura: Masslike opacity is seen in the central right upper lobe, which involves the right hilum and obstructs the right upper lobe bronchus. This measures approximately 7.3 x 5.0 cm,, but is difficult to differentiate from postobstructive collapse. A large right pleural effusion also results in  compressive atelectasis. Musculoskeletal:  No suspicious bone lesions identified. CT ABDOMEN AND PELVIS FINDINGS Hepatobiliary: Multiple hypovascular masses are seen throughout the right and left lobes measuring up to 3 cm, consistent with diffuse liver metastases. Tiny calcified gallstone is noted. No evidence of cholecystitis or biliary ductal dilatation Pancreas:  No mass or inflammatory changes. Spleen:  Within normal limits in size and appearance. Adrenals/Urinary tract: Normal appearance of both adrenal glands. No evidence of renal masses or hydronephrosis. Unremarkable unopacified urinary bladder. Stomach/Bowel: No evidence of obstruction, inflammatory process, or abnormal fluid collections. Vascular/Lymphatic:  Mild lymphadenopathy is seen in the porta hepatis, with largest lymph node measuring 1.4 cm. Multiple small sub-cm lymph nodes are seen abdominal retroperitoneum in the left paraaortic and aortocaval spaces. No pelvic lymphadenopathy identified. Aortic atherosclerosis. Reproductive:  No mass or other significant abnormality identified. Other:  None. Musculoskeletal: Numerous lytic bone metastases are seen throughout the spine, pelvis, and involving several anterior right ribs. IMPRESSION: 1. Large central right upper lobe mass, which involves the right hilum and obstructs the central right upper lobe bronchus, consistent with primary bronchogenic carcinoma. 2. Mild mediastinal lymphadenopathy, consistent with metastatic disease. 3. Large right pleural effusion, suspicious for malignant effusion. 4. Diffuse liver metastases. 5. Diffuse lytic bone metastases. 6. Mild abdominal porta hepatis and retroperitoneal lymphadenopathy, consistent with metastatic disease. Electronically Signed   By: Earle Gell M.D.   On: 09/09/2018 21:36   Ct Cervical Spine Wo Contrast  Result Date: 08/21/2018 CLINICAL DATA:  58 year old female discovered to have severe C3 compression fracture on recent MRI, suspicious for a pathologic fracture. EXAM: CT CERVICAL SPINE WITHOUT CONTRAST TECHNIQUE: Multidetector CT imaging of the cervical spine was performed without intravenous contrast. Multiplanar CT image reconstructions were also generated. COMPARISON:  MRI 08/19/2018. FINDINGS: Alignment: Stable from the recent MRI. Mild retropulsion and slight left retrolisthesis about the abnormal C3 vertebra. Posterior elements remain normally aligned. Subtle degenerative appearing anterolisthesis of C7 on T1. Visualized skull base is intact. No atlanto-occipital dissociation. Skull base and vertebrae: There is near vertebra plana of C3, and portions of the vertebra are eroded/destroyed, most notably the right C3 pedicle and facet as demonstrated on  sagittal image 27 and series 4, image 39. Bone mineralization at the visible skull base appears normal. No other cervical vertebral bone lesion is identified. The visible upper thoracic vertebrae and ribs also appear intact, but the right upper lung is abnormal as described below. Soft tissues and spinal canal: C3 level spinal stenosis with cord compression was better demonstrated on the recent MRI. There are small but conspicuous 7-8 millimeter lymph nodes at the bilateral thoracic inlet/level 4. above the thoracic inlet the noncontrast neck soft tissues appear within normal limits. Disc levels: Better demonstrated on the recent MRI, including C3 spinal stenosis and cord compression. Upper chest: There is a right apical lung mass with superimposed small right pleural effusion. The mass is inseparable from the right paratracheal mediastinum and measures at least 68 x 66 x 46 millimeters (AP by transverse by CC). Some centrilobular emphysema is evident in the left upper lung. There are small but conspicuous right paratracheal lymph nodes. Other findings: Visualized paranasal sinuses and mastoids are well pneumatized. Negative visible noncontrast posterior fossa. IMPRESSION: 1. Constellation of findings most compatible with right lung cancer related C3 metastasis with vertebra plana. C3 spinal stenosis and cord compression appears stable to the MRI yesterday. 2. Right apical Pancoast type lung tumor estimated at 6.8 cm. Small but suspicious right paratracheal and bilateral thoracic inlet  lymph nodes. 3. No other cervical or upper thoracic bone metastasis identified by CT. These results will be called to the ordering clinician or representative by the Radiologist Assistant, and communication documented in the PACS or zVision Dashboard. Electronically Signed   By: Genevie Ann M.D.   On: 08/21/2018 08:38   Mr Jeri Cos DX Contrast  Result Date: 09/15/2018 CLINICAL DATA:  SRS targeting. Pathology from the C3 vertebral body  yielded metastatic non-small cell carcinoma, consistent with origin from lung. EXAM: MRI HEAD WITHOUT AND WITH CONTRAST TECHNIQUE: Multiplanar, multiecho pulse sequences of the brain and surrounding structures were obtained without and with intravenous contrast. CONTRAST:  29mL MULTIHANCE GADOBENATE DIMEGLUMINE 529 MG/ML IV SOLN COMPARISON:  09/05/2018. FINDINGS: Brain: Multiple metastatic deposits are redemonstrated. These are listed from inferior to superior as follows as seen on series 11 axial postcontrast T1 weighted imaging: Image 29, RIGHT cerebellum, 2 mm. Image 41, RIGHT lower pons, 5 mm. Image 53, RIGHT mid pons, 4 mm. Image 56, LEFT paramedian vermis, 1 mm. Image 58, LEFT lateral temporal lobe, 5 mm. Image 59, LEFT medial temporal lobe, 7 x 9 mm. Image 79, RIGHT posterior temporal lobe, 2 mm. Image 89, medial RIGHT frontal lobe, 2 mm. Image 92, RIGHT subinsular white matter, 4 mm. Image 117, LEFT posterior frontal lobe, 4 mm. Image 122, LEFT medial anterior parietal lobe, 2 mm. Image 132, LEFT posterior frontal lobe, 5 mm. Image 132, RIGHT lateral frontal lobe, 4 mm. Image 138, RIGHT superior frontal parasagittal cortex, 5 mm. There may be other lesions which are not detected due to difficulty in distinguishing small lesions from cortical vessels. Redemonstrated are scattered areas of vasogenic edema, affecting not only the cerebral hemispheres but the brainstem. Vascular: Normal flow voids. Skull and upper cervical spine: Normal marrow signal. Cervical spine poorly visualized. Sinuses/Orbits: No significant orbital or paranasal sinus findings. Other: None. IMPRESSION: At least 14 intracranial metastatic deposits are identified, although due to the widespread nature, and small size of some lesions, it is possible that other metastases are undetected. See discussion above. Electronically Signed   By: Staci Righter M.D.   On: 09/15/2018 14:02   Mr Jeri Cos AJ Contrast  Result Date: 09/06/2018 CLINICAL  DATA:  Initial evaluation for lung cancer, staging, known metastatic lesion to cervical spine. EXAM: MRI HEAD WITHOUT AND WITH CONTRAST TECHNIQUE: Multiplanar, multiecho pulse sequences of the brain and surrounding structures were obtained without and with intravenous contrast. CONTRAST:  10 cc of Gadavist.: COMPARISON:  Comparison made with prior MRI and CT of the cervical spine from 08/19/2018 and 08/20/2018. FINDINGS: Brain: Cerebral volume within normal limits for age. No evidence for acute or subacute infarct. Gray-white matter differentiation maintained without evidence for remote cortical infarction. No foci of susceptibility artifact to suggest acute or chronic intracranial hemorrhage. No hydrocephalus. No extra-axial fluid collection. Pituitary gland suprasellar region normal. Midline structures intact and normal. Multiple predominantly subcentimeter rim enhancing lesions seen scattered throughout the supratentorial and infratentorial brain, compatible with intracranial metastatic disease. Overall, there are approximately 10 lesions. Largest lesion within the left cerebral hemisphere positioned at the anterior mesial left temporal lobe in measures 11 mm (series 11, image 53). Largest lesion within the right cerebral hemisphere positioned at the high parasagittal right frontal lobe in measures 6 mm (series 11, image 132). Largest infratentorial lesion involves the right paramedian inferior pons and measures 6 mm (series 11, image 37). Mild localized vasogenic edema seen about a few lesions without significant regional mass effect. Associated edema  seen extending through the right cerebral peduncle along the course of the corticospinal tract related to the brainstem lesions. No associated hemorrhage. No midline shift. Vascular: Major intracranial vascular flow voids are maintained. Skull and upper cervical spine: Craniocervical junction within normal limits. Sequelae of interval ACDF at C3-4 for known C3  metastasis noted. No other focal marrow replacing lesions. No scalp soft tissue abnormality. Sinuses/Orbits: Globes and orbital soft tissues within normal limits. Paranasal sinuses are largely clear. No significant mastoid effusion. Fluid-filled air cell noted superior to the right internal auditory canal. Other: None. IMPRESSION: Scattered intracranial metastases involving the supratentorial and infratentorial brain as above. Overall, there are approximately 10 total lesions. Mild localized edema about a few lesions without significant mass effect. No associated hemorrhage. Electronically Signed   By: Jeannine Boga M.D.   On: 09/06/2018 01:17   Ct Abdomen Pelvis W Contrast  Result Date: 09/09/2018 CLINICAL DATA:  Newly diagnosed right lung non-small-cell carcinoma. Staging. EXAM: CT CHEST, ABDOMEN, AND PELVIS WITH CONTRAST TECHNIQUE: Multidetector CT imaging of the chest, abdomen and pelvis was performed following the standard protocol during bolus administration of intravenous contrast. CONTRAST:  171mL OMNIPAQUE IOHEXOL 300 MG/ML SOLN, 78mL OMNIPAQUE IOHEXOL 300 MG/ML SOLN COMPARISON:  None. FINDINGS: CT CHEST FINDINGS Cardiovascular: No acute findings. Mediastinum/Lymph Nodes: Mild mediastinal lymphadenopathy is seen in the pre-vascular, lateral aortic, right paratracheal, AP window, and subcarinal regions. Largest site in the subcarinal region measures 2.0 cm in short axis. Lungs/Pleura: Masslike opacity is seen in the central right upper lobe, which involves the right hilum and obstructs the right upper lobe bronchus. This measures approximately 7.3 x 5.0 cm,, but is difficult to differentiate from postobstructive collapse. A large right pleural effusion also results in compressive atelectasis. Musculoskeletal:  No suspicious bone lesions identified. CT ABDOMEN AND PELVIS FINDINGS Hepatobiliary: Multiple hypovascular masses are seen throughout the right and left lobes measuring up to 3 cm,  consistent with diffuse liver metastases. Tiny calcified gallstone is noted. No evidence of cholecystitis or biliary ductal dilatation Pancreas:  No mass or inflammatory changes. Spleen:  Within normal limits in size and appearance. Adrenals/Urinary tract: Normal appearance of both adrenal glands. No evidence of renal masses or hydronephrosis. Unremarkable unopacified urinary bladder. Stomach/Bowel: No evidence of obstruction, inflammatory process, or abnormal fluid collections. Vascular/Lymphatic: Mild lymphadenopathy is seen in the porta hepatis, with largest lymph node measuring 1.4 cm. Multiple small sub-cm lymph nodes are seen abdominal retroperitoneum in the left paraaortic and aortocaval spaces. No pelvic lymphadenopathy identified. Aortic atherosclerosis. Reproductive:  No mass or other significant abnormality identified. Other:  None. Musculoskeletal: Numerous lytic bone metastases are seen throughout the spine, pelvis, and involving several anterior right ribs. IMPRESSION: 1. Large central right upper lobe mass, which involves the right hilum and obstructs the central right upper lobe bronchus, consistent with primary bronchogenic carcinoma. 2. Mild mediastinal lymphadenopathy, consistent with metastatic disease. 3. Large right pleural effusion, suspicious for malignant effusion. 4. Diffuse liver metastases. 5. Diffuse lytic bone metastases. 6. Mild abdominal porta hepatis and retroperitoneal lymphadenopathy, consistent with metastatic disease. Electronically Signed   By: Earle Gell M.D.   On: 09/09/2018 21:36   Dg C-arm 1-60 Min  Result Date: 08/29/2018 CLINICAL DATA:  C3 corpectomy with C2 through C4 plate. EXAM: DG CERVICAL SPINE - 1 VIEW; DG C-ARM 61-120 MIN COMPARISON:  None. FINDINGS: Intraoperative lateral view of the cervical spine status post C3 corpectomy with anterior cervical plate and screw fixation from C2 through C4 is identified. A total of  18 seconds of fluoroscopic time was utilized.  Fine bony detail is limited due to the C-arm fluoroscopic technique. No immediate intraoperative complications are apparent. IMPRESSION: C3 corpectomy with anterior cervical fusion from C2 through C4. A total of 18 seconds of fluoroscopic time was utilized. Electronically Signed   By: Ashley Royalty M.D.   On: 08/29/2018 19:05   Assessment and Plan:  This is a very pleasant 58 years old white female recently diagnosed with stage IV non-small cell lung cancer presented with large right upper lobe lung mass with obstruction of the right upper lobe bronchus in addition to mediastinal lymphadenopathy, multiple brain metastases, extensive liver metastasis as well as multiple bone metastasis diagnosed in March 2020. I personally and independently reviewed her scan images and discussed the result and showed the images to the patient today. I requested the [facility] molecular studies and PDL 1 expression. I recommended for the patient to proceed with whole brain irradiation as well as palliative radiation to the metastatic disease in the neck and right upper lobe obstructive mass. Once the molecular studies becomes available, I will discuss with the patient other treatment options including targeted therapy if she has actionable mutations or a course of concurrent chemoradiation with carboplatin, Alimta and Keytruda if she has no actionable mutations. For pain management she will continue her current treatment with oxycodone and morphine sulfate.  The patient may benefit from long-acting pain medication like fentanyl patch. I will arrange for the patient a follow-up appointment with me in around 2-3 weeks for reevaluation and more discussion of her treatment options based on the molecular studies and after completion of her brain radiation. The patient was advised to call immediately if she has any concerning symptoms in the interval. The patient voices understanding of  current disease status and treatment options and is in agreement with the current care plan.  All questions were answered. The patient knows to call the clinic with any problems, questions or concerns. We can certainly see the patient much sooner if necessary.  Disclaimer: This note was dictated with voice recognition software. Similar sounding words can inadvertently be transcribed and may not be corrected upon review.  Follow Up Instructions: Lab and follow-up visit in 3 weeks.   I discussed the assessment and treatment plan with the patient. The patient was provided an opportunity to ask questions and all were answered. The patient agreed with the plan and demonstrated an understanding of the instructions.   The patient was advised to call back or seek an in-person evaluation if the symptoms worsen or if the condition fails to improve as anticipated.  I provided 30 minutes of non-face-to-face time during this encounter.   Eilleen Kempf, MD

## 2018-09-19 ENCOUNTER — Telehealth: Payer: Self-pay | Admitting: Internal Medicine

## 2018-09-19 NOTE — Telephone Encounter (Signed)
No 4/2 los

## 2018-09-22 ENCOUNTER — Inpatient Hospital Stay (HOSPITAL_COMMUNITY): Admit: 2018-09-22 | Payer: Managed Care, Other (non HMO)

## 2018-09-22 ENCOUNTER — Emergency Department (HOSPITAL_COMMUNITY): Payer: Managed Care, Other (non HMO)

## 2018-09-22 ENCOUNTER — Telehealth: Payer: Self-pay | Admitting: Medical Oncology

## 2018-09-22 ENCOUNTER — Telehealth: Payer: Self-pay | Admitting: Internal Medicine

## 2018-09-22 ENCOUNTER — Other Ambulatory Visit: Payer: Self-pay | Admitting: Internal Medicine

## 2018-09-22 ENCOUNTER — Telehealth: Payer: Self-pay | Admitting: *Deleted

## 2018-09-22 ENCOUNTER — Encounter (HOSPITAL_COMMUNITY): Payer: Self-pay

## 2018-09-22 ENCOUNTER — Encounter: Payer: Self-pay | Admitting: *Deleted

## 2018-09-22 ENCOUNTER — Other Ambulatory Visit: Payer: Self-pay | Admitting: Medical Oncology

## 2018-09-22 ENCOUNTER — Inpatient Hospital Stay (HOSPITAL_COMMUNITY)
Admission: EM | Admit: 2018-09-22 | Discharge: 2018-09-25 | DRG: 199 | Disposition: A | Discharge status: Home Health | Payer: Managed Care, Other (non HMO) | Source: Ambulatory Visit | Attending: Family Medicine | Admitting: Family Medicine

## 2018-09-22 DIAGNOSIS — R0600 Dyspnea, unspecified: Secondary | ICD-10-CM

## 2018-09-22 DIAGNOSIS — I10 Essential (primary) hypertension: Secondary | ICD-10-CM | POA: Diagnosis present

## 2018-09-22 DIAGNOSIS — F329 Major depressive disorder, single episode, unspecified: Secondary | ICD-10-CM | POA: Diagnosis present

## 2018-09-22 DIAGNOSIS — E785 Hyperlipidemia, unspecified: Secondary | ICD-10-CM | POA: Diagnosis present

## 2018-09-22 DIAGNOSIS — J90 Pleural effusion, not elsewhere classified: Secondary | ICD-10-CM | POA: Diagnosis present

## 2018-09-22 DIAGNOSIS — K228 Other specified diseases of esophagus: Secondary | ICD-10-CM | POA: Diagnosis present

## 2018-09-22 DIAGNOSIS — J939 Pneumothorax, unspecified: Secondary | ICD-10-CM | POA: Diagnosis present

## 2018-09-22 DIAGNOSIS — R791 Abnormal coagulation profile: Secondary | ICD-10-CM | POA: Diagnosis present

## 2018-09-22 DIAGNOSIS — C7931 Secondary malignant neoplasm of brain: Secondary | ICD-10-CM | POA: Diagnosis present

## 2018-09-22 DIAGNOSIS — R1310 Dysphagia, unspecified: Secondary | ICD-10-CM | POA: Diagnosis present

## 2018-09-22 DIAGNOSIS — Y844 Aspiration of fluid as the cause of abnormal reaction of the patient, or of later complication, without mention of misadventure at the time of the procedure: Secondary | ICD-10-CM | POA: Diagnosis present

## 2018-09-22 DIAGNOSIS — Z79899 Other long term (current) drug therapy: Secondary | ICD-10-CM

## 2018-09-22 DIAGNOSIS — J95811 Postprocedural pneumothorax: Secondary | ICD-10-CM | POA: Diagnosis not present

## 2018-09-22 DIAGNOSIS — Z9889 Other specified postprocedural states: Secondary | ICD-10-CM

## 2018-09-22 DIAGNOSIS — J9383 Other pneumothorax: Secondary | ICD-10-CM | POA: Diagnosis not present

## 2018-09-22 DIAGNOSIS — C7951 Secondary malignant neoplasm of bone: Secondary | ICD-10-CM | POA: Diagnosis not present

## 2018-09-22 DIAGNOSIS — C3490 Malignant neoplasm of unspecified part of unspecified bronchus or lung: Secondary | ICD-10-CM | POA: Diagnosis present

## 2018-09-22 DIAGNOSIS — K222 Esophageal obstruction: Secondary | ICD-10-CM

## 2018-09-22 DIAGNOSIS — C3411 Malignant neoplasm of upper lobe, right bronchus or lung: Secondary | ICD-10-CM | POA: Diagnosis present

## 2018-09-22 DIAGNOSIS — E871 Hypo-osmolality and hyponatremia: Secondary | ICD-10-CM | POA: Diagnosis not present

## 2018-09-22 DIAGNOSIS — J9601 Acute respiratory failure with hypoxia: Secondary | ICD-10-CM | POA: Diagnosis present

## 2018-09-22 DIAGNOSIS — C787 Secondary malignant neoplasm of liver and intrahepatic bile duct: Secondary | ICD-10-CM | POA: Diagnosis present

## 2018-09-22 DIAGNOSIS — D72829 Elevated white blood cell count, unspecified: Secondary | ICD-10-CM

## 2018-09-22 DIAGNOSIS — T17908A Unspecified foreign body in respiratory tract, part unspecified causing other injury, initial encounter: Secondary | ICD-10-CM

## 2018-09-22 DIAGNOSIS — Z87891 Personal history of nicotine dependence: Secondary | ICD-10-CM

## 2018-09-22 LAB — COMPREHENSIVE METABOLIC PANEL
ALT: 21 U/L (ref 0–44)
AST: 23 U/L (ref 15–41)
Albumin: 3.6 g/dL (ref 3.5–5.0)
Alkaline Phosphatase: 294 U/L — ABNORMAL HIGH (ref 38–126)
Anion gap: 14 (ref 5–15)
BUN: 13 mg/dL (ref 6–20)
CO2: 28 mmol/L (ref 22–32)
Calcium: 9.7 mg/dL (ref 8.9–10.3)
Chloride: 93 mmol/L — ABNORMAL LOW (ref 98–111)
Creatinine, Ser: 0.73 mg/dL (ref 0.44–1.00)
GFR calc Af Amer: >60 mL/min (ref >60)
GFR calc non Af Amer: >60 mL/min (ref >60)
Glucose, Bld: 126 mg/dL — ABNORMAL HIGH (ref 70–99)
Potassium: 3.4 mmol/L — ABNORMAL LOW (ref 3.5–5.1)
Sodium: 135 mmol/L (ref 135–145)
Total Bilirubin: 0.6 mg/dL (ref 0.3–1.2)
Total Protein: 8 g/dL (ref 6.5–8.1)

## 2018-09-22 LAB — CBC
HCT: 46.2 % — ABNORMAL HIGH (ref 36.0–46.0)
Hemoglobin: 14.7 g/dL (ref 12.0–15.0)
MCH: 26.5 pg (ref 26.0–34.0)
MCHC: 31.8 g/dL (ref 30.0–36.0)
MCV: 83.2 fL (ref 80.0–100.0)
Platelets: 263 10*3/uL (ref 150–400)
RBC: 5.55 MIL/uL — ABNORMAL HIGH (ref 3.87–5.11)
RDW: 13.2 % (ref 11.5–15.5)
WBC: 21.1 10*3/uL — ABNORMAL HIGH (ref 4.0–10.5)
nRBC: 0 % (ref 0.0–0.2)

## 2018-09-22 LAB — PROTIME-INR
INR: 1.8 — ABNORMAL HIGH (ref 0.8–1.2)
Prothrombin Time: 20.6 seconds — ABNORMAL HIGH (ref 11.4–15.2)

## 2018-09-22 MED ORDER — OXYCODONE-ACETAMINOPHEN 5-325 MG PO TABS
2.0000 | ORAL_TABLET | Freq: Once | ORAL | Status: AC
  Administered 2018-09-22: 2 via ORAL
  Filled 2018-09-22: qty 2

## 2018-09-22 MED ORDER — ACETAMINOPHEN 650 MG RE SUPP: 650.0000 mg | Freq: Four times a day (QID) | RECTAL | Status: DC | PRN

## 2018-09-22 MED ORDER — MORPHINE SULFATE (PF) 4 MG/ML IV SOLN
4.0000 mg | Freq: Once | INTRAVENOUS | Status: AC
  Administered 2018-09-22: 19:00:00 4 mg via INTRAVENOUS
  Filled 2018-09-22: qty 1

## 2018-09-22 MED ORDER — LIDOCAINE HCL 1 % IJ SOLN
INTRAMUSCULAR | Status: AC
  Filled 2018-09-22: qty 10

## 2018-09-22 MED ORDER — GUAIFENESIN-DM 100-10 MG/5ML PO SYRP
5.0000 mL | ORAL_SOLUTION | ORAL | Status: DC | PRN
  Administered 2018-09-22 – 2018-09-25 (×6): 5 mL via ORAL
  Filled 2018-09-22 (×6): qty 10

## 2018-09-22 MED ORDER — ACETAMINOPHEN 325 MG PO TABS: 650.0000 mg | ORAL_TABLET | Freq: Four times a day (QID) | ORAL | Status: DC | PRN

## 2018-09-22 MED ORDER — HYDROCODONE-ACETAMINOPHEN 5-325 MG PO TABS
1.0000 | ORAL_TABLET | ORAL | Status: DC | PRN
  Administered 2018-09-22 – 2018-09-23 (×3): 1 via ORAL
  Administered 2018-09-23: 2 via ORAL
  Administered 2018-09-23 – 2018-09-24 (×2): 1 via ORAL
  Filled 2018-09-22 (×4): qty 1
  Filled 2018-09-22: qty 2
  Filled 2018-09-22 (×3): qty 1

## 2018-09-22 NOTE — ED Notes (Signed)
Lung failed to reexpand due to tumor."Stuck lung" per radiology. 2L of fluid removed.

## 2018-09-22 NOTE — Telephone Encounter (Signed)
Spoke with patent re lab/fu 4/28

## 2018-09-22 NOTE — H&P (Addendum)
History and Physical    Mariah Diaz MWN:[id number] DOB: 07/13/1960 DOA: 09/22/2018  PCP: Orpah Melter, MD   Patient coming from: Home  I have personally briefly reviewed patient's old medical records in [place]  Chief Complaint: Shortness of breath  HPI: Mariah Diaz is a 58 y.o. female with medical history significant of unspecified stage IV lung cancer with noted mets to bone, liver, brain currently being evaluated by radiation oncology and heme-onc possible intervention; hypertension, hyperlipidemia who presents today to IR for thoracentesis after symptomatic dyspnea over the past 2 weeks, worsening more acutely over the past 48 hours with moderate dyspnea even with minimal exertion.  Chest x-ray showed right-sided effusion, patient had successful thoracentesis earlier today draining 2 L fluid per report. Patient became more symptomatic/dyspneic soon after the procedure with hypoxia in the 80s on room air and was sent to the ED for further evaluation and treatment.    ED Course: In the ED chest x-ray did show moderate to large pneumothorax 50-60% per radiology.  Patient was monitored closely in the ED over a few hours with repeat imaging, given her stable nature and only mild hypoxia IR opted not to further intervene on patient.  Given patient's ongoing hypoxia hospitalist was called to admit for overnight monitoring, IR is agreeable to intervene should the patient become more hypoxic or symptomatic over the next 12 hours.  Review of Systems: As per HPI otherwise 10 point review of systems negative.   Past Medical History:  Diagnosis Date  . Depression   . Heart murmur    has not had in checked in "years"  . Hypertension   . No pertinent past medical history     Past Surgical History:  Procedure Laterality Date  . ANTERIOR CERVICAL CORPECTOMY N/A 08/29/2018   Procedure: Cervical 3 Corpectomy with Cervical 2 to Cervical 4 plate;  Surgeon: Erline Levine, MD;   Location: [facility];  Service: Neurosurgery;  Laterality: N/A;  Cervical 3 Corpectomy with Cervical 2 to Cervical 4 plate  . KNEE ARTHROSCOPY WITH ANTERIOR CRUCIATE LIGAMENT (ACL) REPAIR  04/22/2012   Procedure: KNEE ARTHROSCOPY WITH ANTERIOR CRUCIATE LIGAMENT (ACL) REPAIR;  Surgeon: Hessie Dibble, MD;  Location: [facility];  Service: Orthopedics;  Laterality: Right;  . TONSILLECTOMY       reports that she quit smoking about 16 years ago. She has never used smokeless tobacco. She reports current alcohol use of about 21.0 standard drinks of alcohol per week. She reports that she does not use drugs.  No Known Allergies  History reviewed. No pertinent family history.   Prior to Admission medications   Medication Sig Start Date End Date Taking? Authorizing Provider  benzonatate (TESSALON) 200 MG capsule Take 200 mg by mouth 3 (three) times daily as needed for cough.  09/08/18  Yes [provider]  ibuprofen (ADVIL,MOTRIN) 600 MG tablet Take 1 tablet by mouth every 8 (eight) hours as needed for moderate pain.  08/18/18  Yes Orpah Melter, MD  lisinopril (PRINIVIL,ZESTRIL) 20 MG tablet Take 20 mg by mouth daily.    Yes [provider]  lovastatin (MEVACOR) 20 MG tablet Take 20 mg by mouth at bedtime.   Yes [provider]  morphine (MSIR) 15 MG tablet Take 1 tablet (15 mg total) by mouth every 4 (four) hours as needed for severe pain. 09/16/18  Yes Gery Pray, MD  oxyCODONE-acetaminophen (PERCOCET) 10-325 MG tablet Take 1 tablet by mouth every 4 (four) hours as  needed for pain. 09/16/18  Yes Gery Pray, MD  sertraline (ZOLOFT) 100 MG tablet Take 200 mg by mouth daily.    Yes [provider]  traZODone (DESYREL) 50 MG tablet Take 50 mg by mouth at bedtime as needed for sleep.    Yes [provider]  morphine (MS CONTIN) 30 MG 12 hr tablet Take 1 tablet (30 mg total) by mouth every 12 (twelve) hours. 09/16/18   Gery Pray, MD     Physical Exam: Vitals:   09/22/18 1441 09/22/18 1453 09/22/18 1639 09/22/18 1643  BP: (!) 142/89 (!) 136/91 (!) 136/102   Pulse:   62 (!) 102  Resp:   (!) 31 17  Temp:      TempSrc:      SpO2:   (!) 73% 94%    Constitutional: NAD, calm, comfortable Vitals:   09/22/18 1441 09/22/18 1453 09/22/18 1639 09/22/18 1643  BP: (!) 142/89 (!) 136/91 (!) 136/102   Pulse:   62 (!) 102  Resp:   (!) 31 17  Temp:      TempSrc:      SpO2:   (!) 73% 94%   Eyes: PERRL, lids and conjunctivae normal ENMT: Mucous membranes are moist. Posterior pharynx clear of any exudate or lesions.Normal dentition.  Neck: normal, supple, no masses, no thyromegaly Respiratory: diminished breath sounds R most notably apically  Cardiovascular: Regular rate and rhythm, no murmurs / rubs / gallops. No extremity edema. 2+ pedal pulses. No carotid bruits.  Abdomen: no tenderness, no masses palpated. No hepatosplenomegaly. Bowel sounds positive.  Musculoskeletal: no clubbing / cyanosis. No joint deformity upper and lower extremities. Good ROM, no contractures. Normal muscle tone.  Skin: no rashes, lesions, ulcers. No induration Neurologic: CN 2-12 grossly intact. Sensation intact, DTR normal. Strength 5/5 in all 4.  Psychiatric: Normal judgment and insight. Alert and oriented x 3. Normal mood.   Labs on Admission: I have personally reviewed following labs and imaging studies  CBC: Recent Labs  Lab 09/22/18 1135  WBC 21.1*  HGB 14.7  HCT 46.2*  MCV 83.2  PLT 893   Basic Metabolic Panel: Recent Labs  Lab 09/22/18 1135  NA 135  K 3.4*  CL 93*  CO2 28  GLUCOSE 126*  BUN 13  CREATININE 0.73  CALCIUM 9.7   GFR: CrCl cannot be calculated (Unknown ideal weight.). Liver Function Tests: Recent Labs  Lab 09/22/18 1135  AST 23  ALT 21  ALKPHOS 294*  BILITOT 0.6  PROT 8.0  ALBUMIN 3.6   No results for input(s): LIPASE, AMYLASE in the last 168 hours. No results for input(s): AMMONIA in the last 168  hours. Coagulation Profile: Recent Labs  Lab 09/22/18 1135  INR 1.8*   Cardiac Enzymes: No results for input(s): CKTOTAL, CKMB, CKMBINDEX, TROPONINI in the last 168 hours. BNP (last 3 results) No results for input(s): PROBNP in the last 8760 hours. HbA1C: No results for input(s): HGBA1C in the last 72 hours. CBG: No results for input(s): GLUCAP in the last 168 hours. Lipid Profile: No results for input(s): CHOL, HDL, LDLCALC, TRIG, CHOLHDL, LDLDIRECT in the last 72 hours. Thyroid Function Tests: No results for input(s): TSH, T4TOTAL, FREET4, T3FREE, THYROIDAB in the last 72 hours. Anemia Panel: No results for input(s): VITAMINB12, FOLATE, FERRITIN, TIBC, IRON, RETICCTPCT in the last 72 hours. Urine analysis: No results found for: COLORURINE, APPEARANCEUR, LABSPEC, PHURINE, GLUCOSEU, HGBUR, BILIRUBINUR, KETONESUR, PROTEINUR, UROBILINOGEN, NITRITE, LEUKOCYTESUR  Radiological Exams on Admission: Dg Chest 1 View  Result Date: 09/22/2018 CLINICAL DATA:  Stage for lung cancer diagnosed 1 month ago. Shortness of breath. EXAM: CHEST  1 VIEW COMPARISON:  Earlier same day FINDINGS: Heart size is normal. Left chest remains clear. There has been previous right thoracentesis with removal of the left pleural fluid. There is a right pneumothorax estimated at 50-60%. No tension is evident. IMPRESSION: 50-60% right pneumothorax following thoracentesis. No visible residual pleural fluid. No evidence of tension. These results will be called to the ordering clinician or representative by the Radiologist Assistant, and communication documented in the PACS or zVision Dashboard. Electronically Signed   By: Nelson Chimes M.D.   On: 09/22/2018 15:34   Dg Chest Port 1 View  Result Date: 09/22/2018 CLINICAL DATA:  Followup right pneumothorax. EXAM: PORTABLE CHEST 1 VIEW COMPARISON:  Earlier same day FINDINGS: Persistent right pneumothorax with complete collapse of the right lung. Pneumothorax is probably enlarging  slightly, now estimated at 60-70%. No definite evidence of tension. IMPRESSION: Probable slight worsening of the right pneumothorax, estimated at 60-70%. Electronically Signed   By: Nelson Chimes M.D.   On: 09/22/2018 17:13   Dg Chest Port 1 View  Result Date: 09/22/2018 CLINICAL DATA:  Shortness of breath 2 days. Recently diagnosed with right-sided stage IV lung cancer. EXAM: PORTABLE CHEST 1 VIEW COMPARISON:  Chest CT 09/09/2018 FINDINGS: Exam demonstrates moderate opacification over the right lung compatible with moderate size effusion tracking to the apex. Findings are similar to patient's recent CT scan which demonstrated a patient's known large right upper lobe lung cancer with large right effusion. Borders of the known right upper lobe mass or not well visualized. Left lung is clear. Cardiomediastinal silhouette is within normal. Mild mottled sclerosis over the left scapula in the region of the glenoid suggesting metastatic disease. IMPRESSION: Moderate opacification over the right lung compatible with moderate size effusion likely with basilar atelectasis without significant change from recent CT. Patient's known medial right upper lobe lung cancer is not well visualized. Mottled sclerosis over the left scapula/glenoid suggesting metastatic disease. Electronically Signed   By: Marin Olp M.D.   On: 09/22/2018 12:19   US Thoracentesis Asp Pleural Space W/img Guide  Result Date: 09/22/2018 INDICATION: Patient with history of stage IV lung cancer, dyspnea, large right pleural effusion; request received for therapeutic right thoracentesis. EXAM: ULTRASOUND GUIDED THERAPEUTIC RIGHT THORACENTESIS MEDICATIONS: None COMPLICATIONS: None immediate. PROCEDURE: An ultrasound guided thoracentesis was thoroughly discussed with the patient and questions answered. The benefits, risks, alternatives and complications were also discussed. The patient understands and wishes to proceed with the procedure. Written consent  was obtained. Ultrasound was performed to localize and mark an adequate pocket of fluid in the right chest. The area was then prepped and draped in the normal sterile fashion. 1% Lidocaine was used for local anesthesia. Under ultrasound guidance a 6 Fr Safe-T-Centesis catheter was introduced. Thoracentesis was performed. The catheter was removed and a dressing applied. FINDINGS: A total of approximately 1.9 liters of yellow fluid was removed. IMPRESSION: Successful ultrasound guided therapeutic right thoracentesis yielding 1.9 liters of pleural fluid. Follow-up chest x-ray revealed approximately 50-60% pneumothorax, most likely ex vacuo in origin with failed reexpansion of the lung due to tumor. Above findings discussed with Drs. Yamagata/Mohamed and patient's nurse. Patient currently stable. If symptoms worsen can obtain additional follow-up chest x-ray. Read by: Rowe Robert, PA-C Electronically Signed   By: Aletta Edouard M.D.   On: 09/22/2018 16:00    EKG: Independently reviewed.  Assessment/Plan Active Problems:  Bone metastasis ([facility])   Malignant neoplasm of unspecified part of unspecified bronchus or lung ([place])   Metastatic cancer to spine [facility])   Brain metastases ([place])   Liver metastases ([place])   Pneumothorax, acute  Acute hypoxic respiratory failure 2/2 right-sided pneumothorax.  - As above patient continues to require 2-3L [place] even at rest after successful ultrasound-guided thoracentesis draining 2L of right-sided pleural fluid - IR will continue to follow along - defer to their expertise for further management or intervention - Holding anticoagulation in anticipation of possible intervention as above -Repeat CXR PRN and at 0600 to follow progression/resolution - previously 50-70%  Leukocytosis, likely reactive given above.  -Follow with morning labs  -Patient does meet SIRS criteria given apnea and tachycardia post procedure however she does not have any acute infectious etiology  suspected and does not meet sepsis criteria  Stage IV lung cancer (unspecified) with mets to bone, brain, liver -Following both radiation oncology and heme-onc, defer to their expertise for ongoing treatment/intervention  Incidentally noted elevated INR -Follow with morning labs - may be secondary to liver mets  HTN, essential -Stable, continue home meds  HLD -Defer to PCP for management   DVT prophylaxis: Contraindicated, possible need for procedure in the next 12 hours Code Status: Full Family Communication: None present Disposition Plan: Patient currently in observation, likely disposition home in the next 24 hours ending clinical status and need for further intervention with IR given pneumothorax and hypoxia ongoing as above Consults called: IR already following, Dr. Pascal Lux aware Admission status:, Telemetry, observation  [facility]  If 7PM-7AM, please contact night-coverage [email or web address] Password [facility]  09/22/2018, 6:40 PM

## 2018-09-22 NOTE — ED Provider Notes (Signed)
  Physical Exam  BP (!) 136/102   Pulse (!) 102 Comment: 3L O2 by [place]  Temp 98.3 F (36.8 C) (Oral)   Resp 17 Comment: 3L O2 by [place]  SpO2 94% Comment: 3L O2 by [place]  Physical Exam  ED Course/Procedures     Procedures  MDM  Care assumed at 5 pm. Patient has SOB and had large pleural effusion. US thoracentesis performed by radiology and there is large pneumothorax afterwards. Dr. Jeanell Sparrow called Dr. Barbie Banner and he plans to perform chest tube. Plan to follow up IR chest tube and admit patient given new oxygen requirement.   6:00 PM Dr. Pascal Lux called me and states that he felt that patient likely had a pneumothorax since the lung doesn't expand due to the underlying cancer. He felt that chest tube may be more harmful right now. He recommends observation and repeat CXR in AM. Talked to hospitalist about admitting patient. Dr. Avon Gully will see patient and touch base with IR. IR plans to see patient in AM as well.    Drenda Freeze, MD 09/22/18 [phone]

## 2018-09-22 NOTE — Progress Notes (Addendum)
Patient ID: Mariah Diaz, female   DOB: 1960/06/25, 58 y.o.   MRN: [id number] Patient status post right thoracentesis today; follow up chest x-ray reviewed by Dr. Kathlene Cote.  Although 50 to 60% pneumothorax reported, this is most likely ex vacuo in origin with failed reexpansion of the lung due to tumor. Above findings d/w pt's nurse and Dr. Julien Nordmann. Pt currently stable per nurse. If symptoms worsen obtain CXR.

## 2018-09-22 NOTE — Telephone Encounter (Signed)
Oncology Nurse Navigator Documentation  Oncology Nurse Navigator Flowsheets 09/22/2018  Navigator Location [facility]  Referral date to RadOnc/MedOnc -  Navigator Encounter Type Telephone/I received a call back from [place] PA with symptom management.  He states he was unable to get her an appt for today and made multiple calls and could not get a clear answer on where thoracentesis were being done.  He thinks best plan of care is for her to go to ED.  I called patient and he friend was driving her to the hospital.  I updated that she needed to be evaluated in ED. She verbalized understanding.    Telephone Outgoing Call  Abnormal Finding Date -  Confirmed Diagnosis Date -  Surgery Date -  Treatment Initiated Date -  Treatment Phase -  Barriers/Navigation Needs Coordination of Care  Education -  Interventions Coordination of Care  Coordination of Care Other  Education Method -  Acuity Level 2  Time Spent with Patient 30

## 2018-09-22 NOTE — Telephone Encounter (Addendum)
Needs thoracentesis today . I called radiology and they will call Mariah Diaz.

## 2018-09-22 NOTE — ED Triage Notes (Addendum)
Patient recently diagnosed with stage 4 lung cancer last month.   C/O shob that got worse this morning.   85% RA 98% on 3 litters  Fluid in right lung per patient   Patient not normally on 02 at home.  A/Ox4 Wheelchair in triage

## 2018-09-22 NOTE — ED Notes (Signed)
Pt alert, calm, comfortable.  Call bell within reach.

## 2018-09-22 NOTE — ED Notes (Signed)
Per IR, 1.9 Liters taken off during procedure.

## 2018-09-22 NOTE — ED Notes (Signed)
ED TO INPATIENT HANDOFF REPORT  ED Nurse Name and Phone #: [phone]  S Name/Age/Gender Mariah Diaz 58 y.o. female Room/Bed: WA09/WA09  Code Status   Code Status: Full Code  Home/SNF/Other Home Patient oriented to: self, place, time and situation Is this baseline? Yes   Triage Complete: Triage complete  Chief Complaint .  Triage Note Patient recently diagnosed with stage 4 lung cancer last month.   C/O shob that got worse this morning.   85% RA 98% on 3 litters  Fluid in right lung per patient   Patient not normally on 02 at home.  A/Ox4 Wheelchair in triage     Allergies No Known Allergies  Level of Care/Admitting Diagnosis ED Disposition    ED Disposition Condition [facility] Area: [place] [[id number]]  Level of Care: Telemetry [5]  Admit to tele based on following criteria: Other see comments  Comments: Pneumothorax  Diagnosis: Pneumothorax, acute [885027]  Admitting Physician: Little Ishikawa [[id number]]  Attending Physician: Little Ishikawa [phone]  PT Class (Do Not Modify): Observation [104]  PT Acc Code (Do Not Modify): Observation [10022]       B Medical/Surgery History Past Medical History:  Diagnosis Date  . Depression   . Heart murmur    has not had in checked in "years"  . Hypertension   . No pertinent past medical history    Past Surgical History:  Procedure Laterality Date  . ANTERIOR CERVICAL CORPECTOMY N/A 08/29/2018   Procedure: Cervical 3 Corpectomy with Cervical 2 to Cervical 4 plate;  Surgeon: Erline Levine, MD;  Location: [place];  Service: Neurosurgery;  Laterality: N/A;  Cervical 3 Corpectomy with Cervical 2 to Cervical 4 plate  . KNEE ARTHROSCOPY WITH ANTERIOR CRUCIATE LIGAMENT (ACL) REPAIR  04/22/2012   Procedure: KNEE ARTHROSCOPY WITH ANTERIOR CRUCIATE LIGAMENT (ACL) REPAIR;  Surgeon: Hessie Dibble, MD;  Location: [facility];  Service: Orthopedics;   Laterality: Right;  . TONSILLECTOMY       A IV Location/Drains/Wounds Patient Lines/Drains/Airways Status   Active Line/Drains/Airways    Name:   Placement date:   Placement time:   Site:   Days:   Peripheral IV 09/22/18 Right Antecubital   09/22/18    1130    Antecubital   less than 1   Incision 04/22/12 Knee Right   04/22/12    1205     2344   Incision (Closed) 08/29/18 Neck   08/29/18    1839     24          Intake/Output Last 24 hours No intake or output data in the 24 hours ending 09/22/18 1930  Labs/Imaging Results for orders placed or performed during the hospital encounter of 09/22/18 (from the past 48 hour(s))  CBC     Status: Abnormal   Collection Time: 09/22/18 11:35 AM  Result Value Ref Range   WBC 21.1 (H) 4.0 - 10.5 K/uL   RBC 5.55 (H) 3.87 - 5.11 MIL/uL   Hemoglobin 14.7 12.0 - 15.0 g/dL   HCT 46.2 (H) 36.0 - 46.0 %   MCV 83.2 80.0 - 100.0 fL   MCH 26.5 26.0 - 34.0 pg   MCHC 31.8 30.0 - 36.0 g/dL   RDW 13.2 11.5 - 15.5 %   Platelets 263 150 - 400 K/uL   nRBC 0.0 0.0 - 0.2 %    Comment: Performed at [facility], [place] [address] [place], [place] [address]  Comprehensive metabolic panel     Status: Abnormal   Collection Time: 09/22/18 11:35 AM  Result Value Ref Range   Sodium 135 135 - 145 mmol/L   Potassium 3.4 (L) 3.5 - 5.1 mmol/L   Chloride 93 (L) 98 - 111 mmol/L   CO2 28 22 - 32 mmol/L   Glucose, Bld 126 (H) 70 - 99 mg/dL   BUN 13 6 - 20 mg/dL   Creatinine, Ser 0.73 0.44 - 1.00 mg/dL   Calcium 9.7 8.9 - 10.3 mg/dL   Total Protein 8.0 6.5 - 8.1 g/dL   Albumin 3.6 3.5 - 5.0 g/dL   AST 23 15 - 41 U/L   ALT 21 0 - 44 U/L   Alkaline Phosphatase 294 (H) 38 - 126 U/L   Total Bilirubin 0.6 0.3 - 1.2 mg/dL   GFR calc non Af Amer >60 >60 mL/min   GFR calc Af Amer >60 >60 mL/min   Anion gap 14 5 - 15    Comment: Performed at [facility], [place] [address] [place], [place] [address]  Protime-INR     Status: Abnormal    Collection Time: 09/22/18 11:35 AM  Result Value Ref Range   Prothrombin Time 20.6 (H) 11.4 - 15.2 seconds   INR 1.8 (H) 0.8 - 1.2    Comment: (NOTE) INR goal varies based on device and disease states. Performed at [facility], [place] [address] [place], [place] [address]    Dg Chest 1 View  Result Date: 09/22/2018 CLINICAL DATA:  Stage for lung cancer diagnosed 1 month ago. Shortness of breath. EXAM: CHEST  1 VIEW COMPARISON:  Earlier same day FINDINGS: Heart size is normal. Left chest remains clear. There has been previous right thoracentesis with removal of the left pleural fluid. There is a right pneumothorax estimated at 50-60%. No tension is evident. IMPRESSION: 50-60% right pneumothorax following thoracentesis. No visible residual pleural fluid. No evidence of tension. These results will be called to the ordering clinician or representative by the Radiologist Assistant, and communication documented in the PACS or zVision Dashboard. Electronically Signed   By: Nelson Chimes M.D.   On: 09/22/2018 15:34   Dg Chest Port 1 View  Result Date: 09/22/2018 CLINICAL DATA:  Followup right pneumothorax. EXAM: PORTABLE CHEST 1 VIEW COMPARISON:  Earlier same day FINDINGS: Persistent right pneumothorax with complete collapse of the right lung. Pneumothorax is probably enlarging slightly, now estimated at 60-70%. No definite evidence of tension. IMPRESSION: Probable slight worsening of the right pneumothorax, estimated at 60-70%. Electronically Signed   By: Nelson Chimes M.D.   On: 09/22/2018 17:13   Dg Chest Port 1 View  Result Date: 09/22/2018 CLINICAL DATA:  Shortness of breath 2 days. Recently diagnosed with right-sided stage IV lung cancer. EXAM: PORTABLE CHEST 1 VIEW COMPARISON:  Chest CT 09/09/2018 FINDINGS: Exam demonstrates moderate opacification over the right lung compatible with moderate size effusion tracking to the apex. Findings are similar to patient's recent CT scan which  demonstrated a patient's known large right upper lobe lung cancer with large right effusion. Borders of the known right upper lobe mass or not well visualized. Left lung is clear. Cardiomediastinal silhouette is within normal. Mild mottled sclerosis over the left scapula in the region of the glenoid suggesting metastatic disease. IMPRESSION: Moderate opacification over the right lung compatible with moderate size effusion likely with basilar atelectasis without significant change from recent CT. Patient's known medial right upper lobe lung cancer is not well  visualized. Mottled sclerosis over the left scapula/glenoid suggesting metastatic disease. Electronically Signed   By: Marin Olp M.D.   On: 09/22/2018 12:19   US Thoracentesis Asp Pleural Space W/img Guide  Result Date: 09/22/2018 INDICATION: Patient with history of stage IV lung cancer, dyspnea, large right pleural effusion; request received for therapeutic right thoracentesis. EXAM: ULTRASOUND GUIDED THERAPEUTIC RIGHT THORACENTESIS MEDICATIONS: None COMPLICATIONS: None immediate. PROCEDURE: An ultrasound guided thoracentesis was thoroughly discussed with the patient and questions answered. The benefits, risks, alternatives and complications were also discussed. The patient understands and wishes to proceed with the procedure. Written consent was obtained. Ultrasound was performed to localize and mark an adequate pocket of fluid in the right chest. The area was then prepped and draped in the normal sterile fashion. 1% Lidocaine was used for local anesthesia. Under ultrasound guidance a 6 Fr Safe-T-Centesis catheter was introduced. Thoracentesis was performed. The catheter was removed and a dressing applied. FINDINGS: A total of approximately 1.9 liters of yellow fluid was removed. IMPRESSION: Successful ultrasound guided therapeutic right thoracentesis yielding 1.9 liters of pleural fluid. Follow-up chest x-ray revealed approximately 50-60% pneumothorax,  most likely ex vacuo in origin with failed reexpansion of the lung due to tumor. Above findings discussed with Drs. Yamagata/Mohamed and patient's nurse. Patient currently stable. If symptoms worsen can obtain additional follow-up chest x-ray. Read by: Rowe Robert, PA-C Electronically Signed   By: Aletta Edouard M.D.   On: 09/22/2018 16:00    Pending Labs Unresulted Labs (From admission, onward)    Start     Ordered   09/23/18 0500  CBC  Tomorrow morning,   R     09/22/18 1822   09/23/18 7829  Basic metabolic panel  Tomorrow morning,   R     09/22/18 1822   09/23/18 0500  Protime-INR  Tomorrow morning,   R     09/22/18 1822   09/22/18 1817  HIV antibody (Routine Testing)  Once,   R     09/22/18 1822          Vitals/Pain Today's Vitals   09/22/18 1745 09/22/18 1800 09/22/18 1830 09/22/18 1900  BP:  (!) 143/99 (!) 133/91 (!) 143/98  Pulse: (!) 113 (!) 111 (!) 110 (!) 101  Resp: 19 (!) 21 (!) 22 19  Temp:      TempSrc:      SpO2: 92% 90% 93% 91%  PainSc:        Isolation Precautions No active isolations  Medications Medications  lidocaine (XYLOCAINE) 1 % (with pres) injection (has no administration in time range)  HYDROcodone-acetaminophen (NORCO/VICODIN) 5-325 MG per tablet 1-2 tablet (has no administration in time range)  acetaminophen (TYLENOL) tablet 650 mg (has no administration in time range)    Or  acetaminophen (TYLENOL) suppository 650 mg (has no administration in time range)  oxyCODONE-acetaminophen (PERCOCET/ROXICET) 5-325 MG per tablet 2 tablet (2 tablets Oral Given 09/22/18 1140)  morphine 4 MG/ML injection 4 mg (4 mg Intravenous Given 09/22/18 1852)    Mobility non-ambulatory High fall risk   Focused Assessments Pulmonary Assessment Handoff:  Lung sounds:   O2 Device: Nasal Cannula O2 Flow Rate (L/min): 3 L/min      R Recommendations: See Admitting Provider Note  Report given to:   Additional Notes:

## 2018-09-22 NOTE — ED Notes (Signed)
Pt O2 sat's on room air 84%.  Pt encouraged to take deep breaths, sit upright, O2 sats remained at 85%.  Pt on 2L O2 sat's went up to 91%.  3L O2= 95% .  MD Ray made aware.

## 2018-09-22 NOTE — Procedures (Signed)
Ultrasound-guided therapeutic right thoracentesis performed yielding 1.9 liters of yellow fluid. No immediate complications. Follow-up chest x-ray pending. EBL none.

## 2018-09-22 NOTE — ED Notes (Signed)
Pt's family member: Tresa Endo [phone]. Password discussed with all is "[email or web address]"

## 2018-09-22 NOTE — ED Provider Notes (Signed)
[facility] DEPT Provider Note   CSN: [id number] Arrival date & time: 09/22/18  1110    History   Chief Complaint Chief Complaint  Patient presents with   Shortness of Breath    HPI Mariah Diaz is a 58 y.o. female.     HPI  58 year old female history of lung cancer with metastatic disease, hypertension, presents with increasing dyspnea.  She states that she was diagnosed with probable cancer on March 4.  She is undergone no treatment.  She has had increasing dyspnea since that time.  She has a nonproductive cough.  She denies fever, chills, travel outside of the immediate region, or exposure to anybody with CABG 19.  She states she has had decreased p.o. intake and feels that she is likely lost some weight.  She has been drinking fluids and urinating.  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/29/2018   Bone metastasis ([place]) 08/23/2018   Malignant neoplasm of unspecified part of unspecified bronchus or lung ([place]) 08/23/2018    Past Surgical History:  Procedure Laterality Date   ANTERIOR CERVICAL CORPECTOMY N/A 08/29/2018   Procedure: Cervical 3 Corpectomy with Cervical 2 to Cervical 4 plate;  Surgeon: Erline Levine, MD;  Location: [facility];  Service: Neurosurgery;  Laterality: N/A;  Cervical 3 Corpectomy with Cervical 2 to Cervical 4 plate   KNEE ARTHROSCOPY WITH ANTERIOR CRUCIATE LIGAMENT (ACL) REPAIR  04/22/2012   Procedure: KNEE ARTHROSCOPY WITH ANTERIOR CRUCIATE LIGAMENT (ACL) REPAIR;  Surgeon: Hessie Dibble, MD;  Location: [facility];  Service: Orthopedics;  Laterality: Right;   TONSILLECTOMY       OB History   No obstetric  history on file.      Home Medications    Prior to Admission medications   Medication Sig Start Date End Date Taking? Authorizing Provider  Biotin 10000 MCG TABS Take 10,000 mcg by mouth daily.    [provider]  ibuprofen (ADVIL,MOTRIN) 600 MG tablet Take 1 tablet by mouth every 8 (eight) hours as needed. 08/18/18   Orpah Melter, MD  lisinopril (PRINIVIL,ZESTRIL) 20 MG tablet Take 20 mg by mouth daily.     [provider]  lovastatin (MEVACOR) 20 MG tablet Take 20 mg by mouth at bedtime.    [provider]  morphine (MS CONTIN) 30 MG 12 hr tablet Take 1 tablet (30 mg total) by mouth every 12 (twelve) hours. 09/16/18   Gery Pray, MD  morphine (MSIR) 15 MG tablet Take 1 tablet (15 mg total) by mouth every 4 (four) hours as needed for severe pain. 09/16/18   Gery Pray, MD  oxyCODONE-acetaminophen (PERCOCET) 10-325 MG tablet Take 1 tablet by mouth every 4 (four) hours as needed for pain. 09/16/18   Gery Pray, MD  sertraline (ZOLOFT) 100 MG tablet Take 200 mg by mouth daily.     [provider]  traZODone (DESYREL) 50 MG tablet Take 50 mg by mouth at bedtime.    [provider]    Family History History reviewed. No pertinent family history.  Social History Social History   Tobacco Use   Smoking status: Former Smoker  Last attempt to quit: 08/15/2002    Years since quitting: 16.1   Smokeless tobacco: Never Used  Substance Use Topics   Alcohol use: Yes    Alcohol/week: 21.0 standard drinks    Types: 21 Glasses of wine per week    Comment: daily   Drug use: No     Allergies   Patient has no known allergies.   Review of Systems Review of Systems  All other systems reviewed and are negative.    Physical Exam Updated Vital Signs BP (!) 152/122 (BP Location: Right Arm)    Pulse (!) 108    Temp 98.3 F (36.8 C) (Oral)    Resp (!) 25    SpO2 (!) 85%   Physical Exam   ED Treatments / Results  Labs (all labs  ordered are listed, but only abnormal results are displayed) Labs Reviewed - No data to display  EKG EKG Interpretation  Date/Time:  Monday September 22 2018 11:21:37 EDT Ventricular Rate:  101 PR Interval:    QRS Duration: 93 QT Interval:  349 QTC Calculation: 453 R Axis:   90 Text Interpretation:  Sinus tachycardia Borderline right axis deviation Confirmed by Pattricia Boss [phone]) on 09/22/2018 2:06:50 PM   Radiology Dg Chest 1 View  Result Date: 09/22/2018 CLINICAL DATA:  Stage for lung cancer diagnosed 1 month ago. Shortness of breath. EXAM: CHEST  1 VIEW COMPARISON:  Earlier same day FINDINGS: Heart size is normal. Left chest remains clear. There has been previous right thoracentesis with removal of the left pleural fluid. There is a right pneumothorax estimated at 50-60%. No tension is evident. IMPRESSION: 50-60% right pneumothorax following thoracentesis. No visible residual pleural fluid. No evidence of tension. These results will be called to the ordering clinician or representative by the Radiologist Assistant, and communication documented in the PACS or zVision Dashboard. Electronically Signed   By: Nelson Chimes M.D.   On: 09/22/2018 15:34   Dg Chest Port 1 View  Result Date: 09/22/2018 CLINICAL DATA:  Shortness of breath 2 days. Recently diagnosed with right-sided stage IV lung cancer. EXAM: PORTABLE CHEST 1 VIEW COMPARISON:  Chest CT 09/09/2018 FINDINGS: Exam demonstrates moderate opacification over the right lung compatible with moderate size effusion tracking to the apex. Findings are similar to patient's recent CT scan which demonstrated a patient's known large right upper lobe lung cancer with large right effusion. Borders of the known right upper lobe mass or not well visualized. Left lung is clear. Cardiomediastinal silhouette is within normal. Mild mottled sclerosis over the left scapula in the region of the glenoid suggesting metastatic disease. IMPRESSION: Moderate opacification  over the right lung compatible with moderate size effusion likely with basilar atelectasis without significant change from recent CT. Patient's known medial right upper lobe lung cancer is not well visualized. Mottled sclerosis over the left scapula/glenoid suggesting metastatic disease. Electronically Signed   By: Marin Olp M.D.   On: 09/22/2018 12:19   US Thoracentesis Asp Pleural Space W/img Guide  Result Date: 09/22/2018 INDICATION: Patient with history of stage IV lung cancer, dyspnea, large right pleural effusion; request received for therapeutic right thoracentesis. EXAM: ULTRASOUND GUIDED THERAPEUTIC RIGHT THORACENTESIS MEDICATIONS: None COMPLICATIONS: None immediate. PROCEDURE: An ultrasound guided thoracentesis was thoroughly discussed with the patient and questions answered. The benefits, risks, alternatives and complications were also discussed. The patient understands and wishes to proceed with the procedure. Written consent was obtained. Ultrasound was performed to localize and mark an adequate pocket of fluid in the  right chest. The area was then prepped and draped in the normal sterile fashion. 1% Lidocaine was used for local anesthesia. Under ultrasound guidance a 6 Fr Safe-T-Centesis catheter was introduced. Thoracentesis was performed. The catheter was removed and a dressing applied. FINDINGS: A total of approximately 1.9 liters of yellow fluid was removed. IMPRESSION: Successful ultrasound guided therapeutic right thoracentesis yielding 1.9 liters of pleural fluid. Follow-up chest x-Lanore Renderos revealed approximately 50-60% pneumothorax, most likely ex vacuo in origin with failed reexpansion of the lung due to tumor. Above findings discussed with Drs. Yamagata/Mohamed and patient's nurse. Patient currently stable. If symptoms worsen can obtain additional follow-up chest x-Joyceann Kruser. Read by: Rowe Robert, PA-C Electronically Signed   By: Aletta Edouard M.D.   On: 09/22/2018 16:00     Procedures Procedures (including critical care time)  Medications Ordered in ED Medications - No data to display   Initial Impression / Assessment and Plan / ED Course  I have reviewed the triage vital signs and the nursing notes.  Pertinent labs & imaging results that were available during my care of the patient were reviewed by me and considered in my medical decision making (see chart for details).        Patient with right sided pleural effusion with known non small cell lung cancer sent for dyspnea.  Leukocytosis here with likely source recent steroids.  NO evidence of infection otherwise- no cough, fever, chills. Effusion drained by IR.  Patient with good pain control here with percocet and will give short term rx. Plan repeat cxr and will consider home oxygen if improved from prior. Discussed with Dr. Earlie Server x 2 Follow up cxr reveal right sided pneumothorax Discussed with Dr. Margarita Rana  Awaiting call back from IR Discussed with Dr Darl Householder and he will follow up with IR re tx of pneumo Final Clinical Impressions(s) / ED Diagnoses   Final diagnoses:  Dyspnea  Non-small cell carcinoma of lung ([place])  Recurrent right pleural effusion  Leukocytosis, unspecified type    ED Discharge Orders    None       Pattricia Boss, MD 09/26/18 1443

## 2018-09-22 NOTE — Progress Notes (Signed)
Oncology Nurse Navigator Documentation  Oncology Nurse Navigator Flowsheets 09/22/2018  Navigator Location [facility]  Referral date to RadOnc/MedOnc -  Navigator Encounter Type Other/I received a call from Dr. Vertell Limber updated me on patient.  She is SOB with sats on RA at 90.  I updated Dr. Julien Nordmann and he ordered stat thoracentesis.  I called radiology at [facility] to help get Mariah Diaz an appt.  I was told they will try to get her in today.  I then called Cancer Center's symptom management provider.  He will call radiology to see if he can get her to be seen today.  He will call me back with an update.   Abnormal Finding Date -  Confirmed Diagnosis Date -  Surgery Date -  Treatment Initiated Date -  Treatment Phase -  Barriers/Navigation Needs Coordination of Care  Education -  Interventions Coordination of Care  Coordination of Care Other  Education Method -  Acuity Level 3  Time Spent with Patient 30

## 2018-09-22 NOTE — ED Notes (Signed)
Ray, MD at bedside.

## 2018-09-23 ENCOUNTER — Observation Stay (HOSPITAL_COMMUNITY): Payer: Managed Care, Other (non HMO)

## 2018-09-23 ENCOUNTER — Other Ambulatory Visit: Payer: Self-pay

## 2018-09-23 ENCOUNTER — Encounter (HOSPITAL_COMMUNITY): Payer: Self-pay | Admitting: Internal Medicine

## 2018-09-23 ENCOUNTER — Ambulatory Visit: Payer: Managed Care, Other (non HMO) | Admitting: Radiation Oncology

## 2018-09-23 DIAGNOSIS — C787 Secondary malignant neoplasm of liver and intrahepatic bile duct: Secondary | ICD-10-CM | POA: Diagnosis present

## 2018-09-23 DIAGNOSIS — F329 Major depressive disorder, single episode, unspecified: Secondary | ICD-10-CM | POA: Diagnosis present

## 2018-09-23 DIAGNOSIS — J939 Pneumothorax, unspecified: Secondary | ICD-10-CM | POA: Diagnosis present

## 2018-09-23 DIAGNOSIS — D72829 Elevated white blood cell count, unspecified: Secondary | ICD-10-CM

## 2018-09-23 DIAGNOSIS — R1310 Dysphagia, unspecified: Secondary | ICD-10-CM | POA: Diagnosis present

## 2018-09-23 DIAGNOSIS — C7951 Secondary malignant neoplasm of bone: Secondary | ICD-10-CM | POA: Diagnosis present

## 2018-09-23 DIAGNOSIS — Z79899 Other long term (current) drug therapy: Secondary | ICD-10-CM | POA: Diagnosis not present

## 2018-09-23 DIAGNOSIS — J9601 Acute respiratory failure with hypoxia: Secondary | ICD-10-CM | POA: Diagnosis present

## 2018-09-23 DIAGNOSIS — J90 Pleural effusion, not elsewhere classified: Secondary | ICD-10-CM

## 2018-09-23 DIAGNOSIS — J95811 Postprocedural pneumothorax: Secondary | ICD-10-CM | POA: Diagnosis present

## 2018-09-23 DIAGNOSIS — E871 Hypo-osmolality and hyponatremia: Secondary | ICD-10-CM | POA: Diagnosis not present

## 2018-09-23 DIAGNOSIS — I10 Essential (primary) hypertension: Secondary | ICD-10-CM | POA: Diagnosis present

## 2018-09-23 DIAGNOSIS — K228 Other specified diseases of esophagus: Secondary | ICD-10-CM | POA: Diagnosis present

## 2018-09-23 DIAGNOSIS — R0600 Dyspnea, unspecified: Secondary | ICD-10-CM | POA: Diagnosis present

## 2018-09-23 DIAGNOSIS — R791 Abnormal coagulation profile: Secondary | ICD-10-CM | POA: Diagnosis present

## 2018-09-23 DIAGNOSIS — C3490 Malignant neoplasm of unspecified part of unspecified bronchus or lung: Secondary | ICD-10-CM | POA: Diagnosis not present

## 2018-09-23 DIAGNOSIS — E785 Hyperlipidemia, unspecified: Secondary | ICD-10-CM | POA: Diagnosis present

## 2018-09-23 DIAGNOSIS — Z87891 Personal history of nicotine dependence: Secondary | ICD-10-CM | POA: Diagnosis not present

## 2018-09-23 DIAGNOSIS — Y844 Aspiration of fluid as the cause of abnormal reaction of the patient, or of later complication, without mention of misadventure at the time of the procedure: Secondary | ICD-10-CM | POA: Diagnosis present

## 2018-09-23 DIAGNOSIS — C7931 Secondary malignant neoplasm of brain: Secondary | ICD-10-CM | POA: Diagnosis present

## 2018-09-23 DIAGNOSIS — C3411 Malignant neoplasm of upper lobe, right bronchus or lung: Secondary | ICD-10-CM | POA: Diagnosis present

## 2018-09-23 LAB — BASIC METABOLIC PANEL
Anion gap: 14 (ref 5–15)
BUN: 13 mg/dL (ref 6–20)
CO2: 25 mmol/L (ref 22–32)
Calcium: 9 mg/dL (ref 8.9–10.3)
Chloride: 93 mmol/L — ABNORMAL LOW (ref 98–111)
Creatinine, Ser: 0.68 mg/dL (ref 0.44–1.00)
GFR calc Af Amer: >60 mL/min (ref >60)
GFR calc non Af Amer: >60 mL/min (ref >60)
Glucose, Bld: 119 mg/dL — ABNORMAL HIGH (ref 70–99)
Potassium: 3.5 mmol/L (ref 3.5–5.1)
Sodium: 132 mmol/L — ABNORMAL LOW (ref 135–145)

## 2018-09-23 LAB — CBC
HCT: 42.9 % (ref 36.0–46.0)
Hemoglobin: 13.7 g/dL (ref 12.0–15.0)
MCH: 26.6 pg (ref 26.0–34.0)
MCHC: 31.9 g/dL (ref 30.0–36.0)
MCV: 83.3 fL (ref 80.0–100.0)
Platelets: 239 10*3/uL (ref 150–400)
RBC: 5.15 MIL/uL — ABNORMAL HIGH (ref 3.87–5.11)
RDW: 13.3 % (ref 11.5–15.5)
WBC: 18.2 10*3/uL — ABNORMAL HIGH (ref 4.0–10.5)
nRBC: 0 % (ref 0.0–0.2)

## 2018-09-23 LAB — PROTIME-INR
INR: 1.7 — ABNORMAL HIGH (ref 0.8–1.2)
Prothrombin Time: 19.4 seconds — ABNORMAL HIGH (ref 11.4–15.2)

## 2018-09-23 LAB — HIV ANTIBODY (ROUTINE TESTING W REFLEX): HIV Screen 4th Generation wRfx: NONREACTIVE

## 2018-09-23 MED ORDER — TRAZODONE HCL 50 MG PO TABS
50.0000 mg | ORAL_TABLET | Freq: Every evening | ORAL | Status: DC | PRN
  Administered 2018-09-23 (×2): 50 mg via ORAL
  Filled 2018-09-23 (×2): qty 1

## 2018-09-23 NOTE — Progress Notes (Signed)
Patient ID: Mariah Diaz, female   DOB: 1961/01/12, 58 y.o.   MRN: [id number] Patient currently stable.  States right lateral chest discomfort which she had prior to thoracentesis is somewhat better.  Dyspnea is not worsening. O2 sats 95%  3 liters N/C. She did have some mild lightheadedness upon going to the restroom this am.  All imaging studies have been reviewed by Dr. Kathlene Cote.  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  No role for chest tube at this time .  Follow-up per Dr. Julien Nordmann.

## 2018-09-23 NOTE — Progress Notes (Addendum)
PROGRESS NOTE    [name]  SHF:[id number] DOB: [birth year] DOA: 09/22/2018 PCP: Orpah Melter, MD   Brief Narrative: Mariah Diaz is a 58 y.o. female with medical history significant of unspecified stage IV lung cancer with noted mets to bone, liver, brain, hypertension, hyperlipidemia. She presented secondary to shortness of breath in setting of pleural effusion. She underwent thoracentesis with resultant large pneumothorax. IR consulted. No chest tube placed.   Assessment & Plan:   Active Problems:   Bone metastasis ([place])   Malignant neoplasm of unspecified part of unspecified bronchus or lung ([place])   Metastatic cancer to spine ([place])   Brain metastases ([place])   Liver metastases ([place])   Pneumothorax, acute   Right sided pneumothorax Likely ex vacuole in setting of non-reexpandable lung in setting of bronchial mass and s/p thoracentesis. Currently conservative management per IR. Pain improved but not resolved this morning. -IR recommendations  Acute respiratory failure Secondary to above problem. Patient does not use oxygen at home. Will need to wean off oxygen, if able, prior to discharge. -Wean to room air -Keep O2 >90%  Leukocytosis Likely reactive. Patient with no significant symptoms to suggest infection. -Repeat CBC in AM  Hyponatremia Mild. Euvolemic currently. -Repeat BMP in AM  Elevated INR In setting of liver metastasis. No evidence of bleeding. Hemoglobin stable.   DVT prophylaxis: SCDs Code Status:   Code Status: Full Code Family Communication: None at bedside Disposition Plan: Discharge pending IR recommendations and wean to room air   Consultants:   Interventional radiology  Procedures:   None  Antimicrobials:  None    Subjective: Pain on right side of chest is slightly improved. Lying on her right side improves her symptoms.  Objective: Vitals:   09/22/18 2018 09/22/18 2020 09/22/18 2129 09/23/18 0420  BP:  (!) 134/91   (!) 145/103  Pulse:  (!) 108  100  Resp:  20  18  Temp:  98.3 F (36.8 C)  98.6 F (37 C)  TempSrc:  Oral  Oral  SpO2:  90% 92% 95%  Height: 5\' 6"  (1.676 m)      No intake or output data in the 24 hours ending 09/23/18 0931 There were no vitals filed for this visit.  Examination:  General exam: Appears calm and comfortable Respiratory system: Clear to auscultation on left with no breath sounds heard on right. Respiratory effort normal. Cardiovascular system: S1 & S2 heard, RRR. No murmurs, rubs, gallops or clicks. Gastrointestinal system: Abdomen is nondistended, soft and nontender. No organomegaly or masses felt. Normal bowel sounds heard. Central nervous system: Alert and oriented. No focal neurological deficits. Extremities: No edema. No calf tenderness Skin: No cyanosis. No rashes Psychiatry: Judgement and insight appear normal. Mood & affect appropriate.     Data Reviewed: I have personally reviewed following labs and imaging studies  CBC: Recent Labs  Lab 09/22/18 1135 09/23/18 0456  WBC 21.1* 18.2*  HGB 14.7 13.7  HCT 46.2* 42.9  MCV 83.2 83.3  PLT 263 502   Basic Metabolic Panel: Recent Labs  Lab 09/22/18 1135 09/23/18 0456  NA 135 132*  K 3.4* 3.5  CL 93* 93*  CO2 28 25  GLUCOSE 126* 119*  BUN 13 13  CREATININE 0.73 0.68  CALCIUM 9.7 9.0   GFR: CrCl cannot be calculated (Unknown ideal weight.). Liver Function Tests: Recent Labs  Lab 09/22/18 1135  AST 23  ALT 21  ALKPHOS 294*  BILITOT 0.6  PROT 8.0  ALBUMIN 3.6  No results for input(s): LIPASE, AMYLASE in the last 168 hours. No results for input(s): AMMONIA in the last 168 hours. Coagulation Profile: Recent Labs  Lab 09/22/18 1135 09/23/18 0456  INR 1.8* 1.7*   Cardiac Enzymes: No results for input(s): CKTOTAL, CKMB, CKMBINDEX, TROPONINI in the last 168 hours. BNP (last 3 results) No results for input(s): PROBNP in the last 8760 hours. HbA1C: No results for input(s): HGBA1C in  the last 72 hours. CBG: No results for input(s): GLUCAP in the last 168 hours. Lipid Profile: No results for input(s): CHOL, HDL, LDLCALC, TRIG, CHOLHDL, LDLDIRECT in the last 72 hours. Thyroid Function Tests: No results for input(s): TSH, T4TOTAL, FREET4, T3FREE, THYROIDAB in the last 72 hours. Anemia Panel: No results for input(s): VITAMINB12, FOLATE, FERRITIN, TIBC, IRON, RETICCTPCT in the last 72 hours. Sepsis Labs: No results for input(s): PROCALCITON, LATICACIDVEN in the last 168 hours.  No results found for this or any previous visit (from the past 240 hour(s)).       Radiology Studies: Dg Chest 1 View  Result Date: 09/22/2018 CLINICAL DATA:  Stage for lung cancer diagnosed 1 month ago. Shortness of breath. EXAM: CHEST  1 VIEW COMPARISON:  Earlier same day FINDINGS: Heart size is normal. Left chest remains clear. There has been previous right thoracentesis with removal of the left pleural fluid. There is a right pneumothorax estimated at 50-60%. No tension is evident. IMPRESSION: 50-60% right pneumothorax following thoracentesis. No visible residual pleural fluid. No evidence of tension. These results will be called to the ordering clinician or representative by the Radiologist Assistant, and communication documented in the PACS or zVision Dashboard. Electronically Signed   By: Nelson Chimes M.D.   On: 09/22/2018 15:34   Portable Chest 1 View  Result Date: 09/23/2018 CLINICAL DATA:  Right pneumothorax. EXAM: PORTABLE CHEST 1 VIEW COMPARISON:  09/22/2018 FINDINGS: There is a persistent large right pneumothorax with further collapse of the right lower lobe. The right middle and upper lobes appear completely collapsed. There is a recurrent small right effusion. Left lung is clear. Heart size and pulmonary vascularity are normal. No evidence of tension pneumothorax. IMPRESSION: Persistent large right pneumothorax with further collapse of the right lower lobe and with complete collapse of the  right middle and upper lobes. The previous CT scan demonstrated a mass obstructing the upper lobe bronchus. Electronically Signed   By: Lorriane Shire M.D.   On: 09/23/2018 08:01   Dg Chest Port 1 View  Result Date: 09/22/2018 CLINICAL DATA:  Followup right pneumothorax. EXAM: PORTABLE CHEST 1 VIEW COMPARISON:  Earlier same day FINDINGS: Persistent right pneumothorax with complete collapse of the right lung. Pneumothorax is probably enlarging slightly, now estimated at 60-70%. No definite evidence of tension. IMPRESSION: Probable slight worsening of the right pneumothorax, estimated at 60-70%. Electronically Signed   By: Nelson Chimes M.D.   On: 09/22/2018 17:13   Dg Chest Port 1 View  Result Date: 09/22/2018 CLINICAL DATA:  Shortness of breath 2 days. Recently diagnosed with right-sided stage IV lung cancer. EXAM: PORTABLE CHEST 1 VIEW COMPARISON:  Chest CT 09/09/2018 FINDINGS: Exam demonstrates moderate opacification over the right lung compatible with moderate size effusion tracking to the apex. Findings are similar to patient's recent CT scan which demonstrated a patient's known large right upper lobe lung cancer with large right effusion. Borders of the known right upper lobe mass or not well visualized. Left lung is clear. Cardiomediastinal silhouette is within normal. Mild mottled sclerosis over the left scapula  in the region of the glenoid suggesting metastatic disease. IMPRESSION: Moderate opacification over the right lung compatible with moderate size effusion likely with basilar atelectasis without significant change from recent CT. Patient's known medial right upper lobe lung cancer is not well visualized. Mottled sclerosis over the left scapula/glenoid suggesting metastatic disease. Electronically Signed   By: Marin Olp M.D.   On: 09/22/2018 12:19   US Thoracentesis Asp Pleural Space W/img Guide  Result Date: 09/22/2018 INDICATION: Patient with history of stage IV lung cancer, dyspnea, large  right pleural effusion; request received for therapeutic right thoracentesis. EXAM: ULTRASOUND GUIDED THERAPEUTIC RIGHT THORACENTESIS MEDICATIONS: None COMPLICATIONS: None immediate. PROCEDURE: An ultrasound guided thoracentesis was thoroughly discussed with the patient and questions answered. The benefits, risks, alternatives and complications were also discussed. The patient understands and wishes to proceed with the procedure. Written consent was obtained. Ultrasound was performed to localize and mark an adequate pocket of fluid in the right chest. The area was then prepped and draped in the normal sterile fashion. 1% Lidocaine was used for local anesthesia. Under ultrasound guidance a 6 Fr Safe-T-Centesis catheter was introduced. Thoracentesis was performed. The catheter was removed and a dressing applied. FINDINGS: A total of approximately 1.9 liters of yellow fluid was removed. IMPRESSION: Successful ultrasound guided therapeutic right thoracentesis yielding 1.9 liters of pleural fluid. Follow-up chest x-ray revealed approximately 50-60% pneumothorax, most likely ex vacuo in origin with failed reexpansion of the lung due to tumor. Above findings discussed with Drs. Yamagata/Mohamed and patient's nurse. Patient currently stable. If symptoms worsen can obtain additional follow-up chest x-ray. Read by: Rowe Robert, PA-C Electronically Signed   By: Aletta Edouard M.D.   On: 09/22/2018 16:00        Scheduled Meds: Continuous Infusions:   LOS: 0 days     Cordelia Poche, MD [facility] 09/23/2018, 9:31 AM  If 7PM-7AM, please contact night-coverage [email or web address]

## 2018-09-24 ENCOUNTER — Inpatient Hospital Stay (HOSPITAL_COMMUNITY): Payer: Managed Care, Other (non HMO)

## 2018-09-24 LAB — CBC
HCT: 42.5 % (ref 36.0–46.0)
Hemoglobin: 13.5 g/dL (ref 12.0–15.0)
MCH: 26.2 pg (ref 26.0–34.0)
MCHC: 31.8 g/dL (ref 30.0–36.0)
MCV: 82.5 fL (ref 80.0–100.0)
Platelets: 260 10*3/uL (ref 150–400)
RBC: 5.15 MIL/uL — ABNORMAL HIGH (ref 3.87–5.11)
RDW: 13.3 % (ref 11.5–15.5)
WBC: 20.3 10*3/uL — ABNORMAL HIGH (ref 4.0–10.5)
nRBC: 0 % (ref 0.0–0.2)

## 2018-09-24 LAB — BASIC METABOLIC PANEL
Anion gap: 12 (ref 5–15)
BUN: 12 mg/dL (ref 6–20)
CO2: 27 mmol/L (ref 22–32)
Calcium: 9.2 mg/dL (ref 8.9–10.3)
Chloride: 93 mmol/L — ABNORMAL LOW (ref 98–111)
Creatinine, Ser: 0.66 mg/dL (ref 0.44–1.00)
GFR calc Af Amer: >60 mL/min (ref >60)
GFR calc non Af Amer: >60 mL/min (ref >60)
Glucose, Bld: 118 mg/dL — ABNORMAL HIGH (ref 70–99)
Potassium: 3.4 mmol/L — ABNORMAL LOW (ref 3.5–5.1)
Sodium: 132 mmol/L — ABNORMAL LOW (ref 135–145)

## 2018-09-24 MED ORDER — MORPHINE SULFATE (PF) 2 MG/ML IV SOLN
0.5000 mg | Freq: Once | INTRAVENOUS | Status: AC
  Administered 2018-09-24: 0.5 mg via INTRAVENOUS
  Filled 2018-09-24: qty 1

## 2018-09-24 MED ORDER — MORPHINE SULFATE 10 MG/5ML PO SOLN
5.0000 mg | ORAL | Status: DC | PRN
  Administered 2018-09-24 – 2018-09-25 (×7): 5 mg via ORAL
  Filled 2018-09-24 (×7): qty 5

## 2018-09-24 NOTE — Progress Notes (Signed)
PROGRESS NOTE    Mariah Diaz  GBT:[id number] DOB: 1960-08-09 DOA: 09/22/2018 PCP: Orpah Melter, MD   Brief Narrative: Mariah Diaz is a 58 y.o. female with medical history significant of unspecified stage IV lung cancer with noted mets to bone, liver, brain, hypertension, hyperlipidemia. She presented secondary to shortness of breath in setting of pleural effusion. She underwent thoracentesis with resultant large pneumothorax. IR consulted. No chest tube placed.   Assessment & Plan:   Active Problems:   Bone metastasis ([place])   Malignant neoplasm of unspecified part of unspecified bronchus or lung ([place])   Metastatic cancer to spine ([place])   Brain metastases ([place])   Liver metastases ([place])   Pneumothorax, acute   Pneumothorax   Right sided pneumothorax Likely ex vacuole in setting of non-reexpandable lung in setting of bronchial mass and s/p thoracentesis. Currently conservative management per IR. Pain improved  Await IR input Rpt cxr in am--if no further issues, d/c home am after am cxr  Nausea and dysphagia Has issues with pill swallowing Pain and "sticking" of pill nausea non specific--giving right now IV mophinr for pain Get barium swallow r/o stricture   Acute respiratory failure Secondary to above problem. Patient does not use oxygen at home. Will need to wean off oxygen, if able, prior to discharge. -Wean to room air -Keep O2 >90%  Leukocytosis Likely reactive. Patient with no significant symptoms to suggest infection. -Repeat CBC in AM  Hyponatremia Mild. Euvolemic currently. stable  Elevated INR In setting of liver metastasis. No evidence of bleeding. Hemoglobin stable.   DVT prophylaxis: SCDs Code Status:   Code Status: Full Code Family Communication: None at bedside Disposition Plan: Discharge pending IR recommendations and wean to room air   Consultants:   Interventional radiology  Procedures:   None  Antimicrobials:  None    Subjective: Pain on right side of chest is slightly improved. Lying on her right side improves her symptoms.  Objective: Vitals:   09/23/18 1248 09/23/18 2053 09/24/18 0500 09/24/18 1325  BP: 127/81 127/76 (!) 126/93 (!) 126/95  Pulse: (!) 107 98 99 100  Resp: 19 16 18 16   Temp: 98.6 F (37 C) 97.9 F (36.6 C) 98.3 F (36.8 C) 98.1 F (36.7 C)  TempSrc: Oral Oral Oral Oral  SpO2: 94% 93% 93% 98%  Height:        Intake/Output Summary (Last 24 hours) at 09/24/2018 1408 Last data filed at 09/24/2018 1308 Gross per 24 hour  Intake 240 ml  Output -  Net 240 ml   There were no vitals filed for this visit.  Examination:  General exam: Appears calm and comfortable Respiratory system: Clear to auscultation on left with no breath sounds heard on right. Respiratory effort normal. Cardiovascular system: S1 & S2 heard, RRR. No murmurs, rubs, gallops or clicks. Gastrointestinal system: Abdomen is nondistended, soft and nontender. No organomegaly or masses felt. Normal bowel sounds heard. Central nervous system: Alert and oriented. No focal neurological deficits. Extremities: No edema. No calf tenderness Skin: No cyanosis. No rashes Psychiatry: Judgement and insight appear normal. Mood & affect appropriate.     Data Reviewed: I have personally reviewed following labs and imaging studies  CBC: Recent Labs  Lab 09/22/18 1135 09/23/18 0456 09/24/18 0513  WBC 21.1* 18.2* 20.3*  HGB 14.7 13.7 13.5  HCT 46.2* 42.9 42.5  MCV 83.2 83.3 82.5  PLT 263 239 710   Basic Metabolic Panel: Recent Labs  Lab 09/22/18 1135 09/23/18 0456 09/24/18 0513  NA 135 132* 132*  K 3.4* 3.5 3.4*  CL 93* 93* 93*  CO2 28 25 27   GLUCOSE 126* 119* 118*  BUN 13 13 12   CREATININE 0.73 0.68 0.66  CALCIUM 9.7 9.0 9.2   GFR: CrCl cannot be calculated (Unknown ideal weight.). Liver Function Tests: Recent Labs  Lab 09/22/18 1135  AST 23  ALT 21  ALKPHOS 294*  BILITOT 0.6  PROT 8.0  ALBUMIN 3.6    No results for input(s): LIPASE, AMYLASE in the last 168 hours. No results for input(s): AMMONIA in the last 168 hours. Coagulation Profile: Recent Labs  Lab 09/22/18 1135 09/23/18 0456  INR 1.8* 1.7*   Cardiac Enzymes: No results for input(s): CKTOTAL, CKMB, CKMBINDEX, TROPONINI in the last 168 hours. BNP (last 3 results) No results for input(s): PROBNP in the last 8760 hours. HbA1C: No results for input(s): HGBA1C in the last 72 hours. CBG: No results for input(s): GLUCAP in the last 168 hours. Lipid Profile: No results for input(s): CHOL, HDL, LDLCALC, TRIG, CHOLHDL, LDLDIRECT in the last 72 hours. Thyroid Function Tests: No results for input(s): TSH, T4TOTAL, FREET4, T3FREE, THYROIDAB in the last 72 hours. Anemia Panel: No results for input(s): VITAMINB12, FOLATE, FERRITIN, TIBC, IRON, RETICCTPCT in the last 72 hours. Sepsis Labs: No results for input(s): PROCALCITON, LATICACIDVEN in the last 168 hours.  No results found for this or any previous visit (from the past 240 hour(s)).       Radiology Studies: Dg Chest 1 View  Result Date: 09/22/2018 CLINICAL DATA:  Stage for lung cancer diagnosed 1 month ago. Shortness of breath. EXAM: CHEST  1 VIEW COMPARISON:  Earlier same day FINDINGS: Heart size is normal. Left chest remains clear. There has been previous right thoracentesis with removal of the left pleural fluid. There is a right pneumothorax estimated at 50-60%. No tension is evident. IMPRESSION: 50-60% right pneumothorax following thoracentesis. No visible residual pleural fluid. No evidence of tension. These results will be called to the ordering clinician or representative by the Radiologist Assistant, and communication documented in the PACS or zVision Dashboard. Electronically Signed   By: Nelson Chimes M.D.   On: 09/22/2018 15:34   Portable Chest 1 View  Result Date: 09/23/2018 CLINICAL DATA:  Right pneumothorax. EXAM: PORTABLE CHEST 1 VIEW COMPARISON:  09/22/2018  FINDINGS: There is a persistent large right pneumothorax with further collapse of the right lower lobe. The right middle and upper lobes appear completely collapsed. There is a recurrent small right effusion. Left lung is clear. Heart size and pulmonary vascularity are normal. No evidence of tension pneumothorax. IMPRESSION: Persistent large right pneumothorax with further collapse of the right lower lobe and with complete collapse of the right middle and upper lobes. The previous CT scan demonstrated a mass obstructing the upper lobe bronchus. Electronically Signed   By: Lorriane Shire M.D.   On: 09/23/2018 08:01   Dg Chest Port 1 View  Result Date: 09/22/2018 CLINICAL DATA:  Followup right pneumothorax. EXAM: PORTABLE CHEST 1 VIEW COMPARISON:  Earlier same day FINDINGS: Persistent right pneumothorax with complete collapse of the right lung. Pneumothorax is probably enlarging slightly, now estimated at 60-70%. No definite evidence of tension. IMPRESSION: Probable slight worsening of the right pneumothorax, estimated at 60-70%. Electronically Signed   By: Nelson Chimes M.D.   On: 09/22/2018 17:13   US Thoracentesis Asp Pleural Space W/img Guide  Result Date: 09/22/2018 INDICATION: Patient with history of stage IV lung cancer, dyspnea, large right pleural effusion; request  received for therapeutic right thoracentesis. EXAM: ULTRASOUND GUIDED THERAPEUTIC RIGHT THORACENTESIS MEDICATIONS: None COMPLICATIONS: None immediate. PROCEDURE: An ultrasound guided thoracentesis was thoroughly discussed with the patient and questions answered. The benefits, risks, alternatives and complications were also discussed. The patient understands and wishes to proceed with the procedure. Written consent was obtained. Ultrasound was performed to localize and mark an adequate pocket of fluid in the right chest. The area was then prepped and draped in the normal sterile fashion. 1% Lidocaine was used for local anesthesia. Under  ultrasound guidance a 6 Fr Safe-T-Centesis catheter was introduced. Thoracentesis was performed. The catheter was removed and a dressing applied. FINDINGS: A total of approximately 1.9 liters of yellow fluid was removed. IMPRESSION: Successful ultrasound guided therapeutic right thoracentesis yielding 1.9 liters of pleural fluid. Follow-up chest x-ray revealed approximately 50-60% pneumothorax, most likely ex vacuo in origin with failed reexpansion of the lung due to tumor. Above findings discussed with Drs. Yamagata/Mohamed and patient's nurse. Patient currently stable. If symptoms worsen can obtain additional follow-up chest x-ray. Read by: Rowe Robert, PA-C Electronically Signed   By: Aletta Edouard M.D.   On: 09/22/2018 16:00        Scheduled Meds: Continuous Infusions:   LOS: 1 day     Cordelia Poche, MD [facility] 09/24/2018, 2:08 PM  If 7PM-7AM, please contact night-coverage [email or web address]

## 2018-09-25 ENCOUNTER — Ambulatory Visit
Admit: 2018-09-25 | Discharge: 2018-09-25 | Disposition: A | Discharge status: Home / Self Care | Payer: Managed Care, Other (non HMO) | Source: Ambulatory Visit | Attending: Radiation Oncology | Admitting: Radiation Oncology

## 2018-09-25 ENCOUNTER — Inpatient Hospital Stay (HOSPITAL_COMMUNITY): Payer: Managed Care, Other (non HMO)

## 2018-09-25 DIAGNOSIS — C7931 Secondary malignant neoplasm of brain: Secondary | ICD-10-CM | POA: Insufficient documentation

## 2018-09-25 DIAGNOSIS — C3490 Malignant neoplasm of unspecified part of unspecified bronchus or lung: Secondary | ICD-10-CM | POA: Insufficient documentation

## 2018-09-25 DIAGNOSIS — Z510 Encounter for antineoplastic radiation therapy: Secondary | ICD-10-CM | POA: Insufficient documentation

## 2018-09-25 DIAGNOSIS — C7951 Secondary malignant neoplasm of bone: Secondary | ICD-10-CM | POA: Insufficient documentation

## 2018-09-25 MED ORDER — MORPHINE SULFATE 10 MG/5ML PO SOLN: 5.0000 mg | ORAL | 0 refills | Status: DC | PRN

## 2018-09-25 NOTE — Discharge Summary (Signed)
Physician Discharge Summary  Mariah Diaz VHQ:[id number] DOB: [birth year] DOA: 09/22/2018  PCP: Orpah Melter, MD  Admit date: 09/22/2018 Discharge date: 09/25/2018  Time spent: 50 minutes  Recommendations for Outpatient Follow-up:  1. Changed to SL morphine 2. Needs OP follow up Pallaitive/Oncology  Discharge Diagnoses:  Active Problems:   Bone metastasis ([place])   Malignant neoplasm of unspecified part of unspecified bronchus or lung ([place])   Metastatic cancer to spine ([place])   Brain metastases ([place])   Liver metastases ([place])   Pneumothorax, acute   Pneumothorax   Discharge Condition: improved  Diet recommendation: chopped-se instructions  There were no vitals filed for this visit.    Right sided pneumothorax Lung cancer-metastatic Likely ex vacuole in setting of non-reexpandable lung in setting of bronchial mass and s/p thoracentesis. Currently conservative management per IR. 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   Leukocytosis Likely reactive. Patient with no significant symptoms to suggest infection. -Repeat CBC in AM   Hyponatremia Mild. Euvolemic currently. stable   Elevated INR In setting of liver metastasis. No evidence of bleeding. Hemoglobin stable.    Hospital Course:  History of present illness:  58 y.o. femalewith medical history significant ofunspecifiedstage IV lung cancerwith noted mets to bone, liver, brain,hypertension, hyperlipidemia. She presented secondary to shortness of breath in setting of pleural effusion. She underwent  thoracentesis with resultant large pneumothorax. IR consulted. No chest tube placed.   Discharge Exam: Vitals:   09/24/18 2249 09/25/18 0555  BP: (!) 137/98 (!) 125/97  Pulse: (!) 102 100  Resp: 16 16  Temp: 98.8 F (37.1 C) 97.7 F (36.5 C)  SpO2: 91% 97%    General: awake alert pleasant in nad no distress eomi ncat no ict no pallor Cardiovascular: s1 s2 no m/r/g Respiratory: clear no added sound  Discharge Instructions   Discharge Instructions    Call MD for:  difficulty breathing, headache or visual disturbances   Complete by:  As directed    Discharge instructions   Complete by:  As directed    We have replaced some of your pain meds with morphine syrup I spoke to Gastroenterology-they have a good recommendation which is to eat a chopped diet, and then drink fluids to hellp the food go down and STAY SEATED for 1 hour or so after eating so that you do not vomit We will order oxygen on discharge for you   Increase activity slowly   Complete by:  As directed      Allergies as of 09/25/2018   No Known Allergies     Medication List    STOP taking these medications   benzonatate 200 MG capsule Commonly known as:  TESSALON   lisinopril 20 MG tablet Commonly known as:  PRINIVIL,ZESTRIL   lovastatin 20 MG tablet Commonly known as:  MEVACOR   morphine 15 MG tablet Commonly known as:  MSIR   morphine 30 MG 12 hr tablet Commonly known as:  MS CONTIN Replaced by:  morphine 10 MG/5ML solution   oxyCODONE-acetaminophen 10-325 MG tablet Commonly known as:  Percocet   sertraline 100 MG tablet Commonly known as:  ZOLOFT     TAKE these medications   ibuprofen 600 MG tablet Commonly known as:  ADVIL,MOTRIN Take 1 tablet by mouth every 8 (eight) hours as needed for moderate pain.   morphine 10 MG/5ML solution Take 2.5 mLs (5 mg total) by mouth every 3 (three) hours as needed for moderate pain. Replaces:  morphine 30 MG 12 hr tablet   traZODone 50 MG  tablet Commonly known as:  DESYREL Take 50 mg by mouth at bedtime as needed for sleep.            Durable Medical Equipment  (From admission, onward)         Start     Ordered   09/25/18 1140  DME Oxygen  Once    Question Answer Comment  Mode or (Route) Nasal cannula   Liters per Minute 3   Frequency Continuous (stationary and portable oxygen unit needed)   Oxygen conserving device No   Oxygen delivery system Gas      09/25/18 1141         No Known Allergies    The results of significant diagnostics from this hospitalization (including imaging, microbiology, ancillary and laboratory) are listed below for reference.    Significant Diagnostic Studies: Dg Chest 1 View  Result Date: 09/25/2018 CLINICAL DATA:  Aspiration into airway EXAM: CHEST  1 VIEW COMPARISON:  Two days ago FINDINGS: Large but diminished right pneumothorax. Moderate pleural effusion on the right with pulmonary opacification. There is pulmonary obstruction by CT. Comparatively clear left chest. Borderline heart size. IMPRESSION: Large right hydropneumothorax with decreasing gas component since 2 days ago. Electronically Signed   By: Monte Fantasia M.D.   On: 09/25/2018 07:48   Dg Chest 1 View  Result Date: 09/22/2018 CLINICAL DATA:  Stage for lung cancer diagnosed 1 month ago. Shortness of breath. EXAM: CHEST  1 VIEW COMPARISON:  Earlier same day FINDINGS: Heart size is normal. Left chest remains clear. There has been previous right thoracentesis with removal of the left pleural fluid. There is a right pneumothorax estimated at 50-60%. No tension is evident. IMPRESSION: 50-60% right pneumothorax following thoracentesis. No visible residual pleural fluid. No evidence of tension. These results will be called to the ordering clinician or representative by the Radiologist Assistant, and communication documented in the PACS or zVision Dashboard. Electronically Signed   By: Nelson Chimes M.D.   On: 09/22/2018 15:34    Dg Cervical Spine 1 View  Result Date: 08/29/2018 CLINICAL DATA:  C3 corpectomy with C2 through C4 plate. EXAM: DG CERVICAL SPINE - 1 VIEW; DG C-ARM 61-120 MIN COMPARISON:  None. FINDINGS: Intraoperative lateral view of the cervical spine status post C3 corpectomy with anterior cervical plate and screw fixation from C2 through C4 is identified. A total of 18 seconds of fluoroscopic time was utilized. Fine bony detail is limited due to the C-arm fluoroscopic technique. No immediate intraoperative complications are apparent. IMPRESSION: C3 corpectomy with anterior cervical fusion from C2 through C4. A total of 18 seconds of fluoroscopic time was utilized. Electronically Signed   By: Ashley Royalty M.D.   On: 08/29/2018 19:05   Ct Chest W Contrast  Result Date: 09/09/2018 CLINICAL DATA:  Newly diagnosed right lung non-small-cell carcinoma. Staging. EXAM: CT CHEST, ABDOMEN, AND PELVIS WITH CONTRAST TECHNIQUE: Multidetector CT imaging of the chest, abdomen and pelvis was performed following the standard protocol during bolus administration of intravenous contrast. CONTRAST:  143mL OMNIPAQUE IOHEXOL 300 MG/ML SOLN, 67mL OMNIPAQUE IOHEXOL 300 MG/ML SOLN  COMPARISON:  None. FINDINGS: CT CHEST FINDINGS Cardiovascular: No acute findings. Mediastinum/Lymph Nodes: Mild mediastinal lymphadenopathy is seen in the pre-vascular, lateral aortic, right paratracheal, AP window, and subcarinal regions. Largest site in the subcarinal region measures 2.0 cm in short axis. Lungs/Pleura: Masslike opacity is seen in the central right upper lobe, which involves the right hilum and obstructs the right upper lobe bronchus. This measures approximately 7.3 x 5.0 cm,, but is difficult to differentiate from postobstructive collapse. A large right pleural effusion also results in compressive atelectasis. Musculoskeletal:  No suspicious bone lesions identified. CT ABDOMEN AND PELVIS FINDINGS Hepatobiliary: Multiple hypovascular masses are seen  throughout the right and left lobes measuring up to 3 cm, consistent with diffuse liver metastases. Tiny calcified gallstone is noted. No evidence of cholecystitis or biliary ductal dilatation Pancreas:  No mass or inflammatory changes. Spleen:  Within normal limits in size and appearance. Adrenals/Urinary tract: Normal appearance of both adrenal glands. No evidence of renal masses or hydronephrosis. Unremarkable unopacified urinary bladder. Stomach/Bowel: No evidence of obstruction, inflammatory process, or abnormal fluid collections. Vascular/Lymphatic: Mild lymphadenopathy is seen in the porta hepatis, with largest lymph node measuring 1.4 cm. Multiple small sub-cm lymph nodes are seen abdominal retroperitoneum in the left paraaortic and aortocaval spaces. No pelvic lymphadenopathy identified. Aortic atherosclerosis. Reproductive:  No mass or other significant abnormality identified. Other:  None. Musculoskeletal: Numerous lytic bone metastases are seen throughout the spine, pelvis, and involving several anterior right ribs. IMPRESSION: 1. Large central right upper lobe mass, which involves the right hilum and obstructs the central right upper lobe bronchus, consistent with primary bronchogenic carcinoma. 2. Mild mediastinal lymphadenopathy, consistent with metastatic disease. 3. Large right pleural effusion, suspicious for malignant effusion. 4. Diffuse liver metastases. 5. Diffuse lytic bone metastases. 6. Mild abdominal porta hepatis and retroperitoneal lymphadenopathy, consistent with metastatic disease. Electronically Signed   By: Earle Gell M.D.   On: 09/09/2018 21:36   Mr Jeri Cos UV Contrast  Result Date: 09/15/2018 CLINICAL DATA:  SRS targeting. Pathology from the C3 vertebral body yielded metastatic non-small cell carcinoma, consistent with origin from lung. EXAM: MRI HEAD WITHOUT AND WITH CONTRAST TECHNIQUE: Multiplanar, multiecho pulse sequences of the brain and surrounding structures were  obtained without and with intravenous contrast. CONTRAST:  12mL MULTIHANCE GADOBENATE DIMEGLUMINE 529 MG/ML IV SOLN COMPARISON:  09/05/2018. FINDINGS: Brain: Multiple metastatic deposits are redemonstrated. These are listed from inferior to superior as follows as seen on series 11 axial postcontrast T1 weighted imaging: Image 29, RIGHT cerebellum, 2 mm. Image 41, RIGHT lower pons, 5 mm. Image 53, RIGHT mid pons, 4 mm. Image 56, LEFT paramedian vermis, 1 mm. Image 58, LEFT lateral temporal lobe, 5 mm. Image 59, LEFT medial temporal lobe, 7 x 9 mm. Image 79, RIGHT posterior temporal lobe, 2 mm. Image 89, medial RIGHT frontal lobe, 2 mm. Image 92, RIGHT subinsular white matter, 4 mm. Image 117, LEFT posterior frontal lobe, 4 mm. Image 122, LEFT medial anterior parietal lobe, 2 mm. Image 132, LEFT posterior frontal lobe, 5 mm. Image 132, RIGHT lateral frontal lobe, 4 mm. Image 138, RIGHT superior frontal parasagittal cortex, 5 mm. There may be other lesions which are not detected due to difficulty in distinguishing small lesions from cortical vessels. Redemonstrated are scattered areas of vasogenic edema, affecting not only the cerebral hemispheres but the brainstem. Vascular: Normal flow voids. Skull and upper cervical spine: Normal marrow signal. Cervical spine poorly visualized. Sinuses/Orbits: No significant orbital or paranasal sinus findings. Other: None. IMPRESSION: At  least 14 intracranial metastatic deposits are identified, although due to the widespread nature, and small size of some lesions, it is possible that other metastases are undetected. See discussion above. Electronically Signed   By: Staci Righter M.D.   On: 09/15/2018 14:02   Mr Jeri Cos WU Contrast  Result Date: 09/06/2018 CLINICAL DATA:  Initial evaluation for lung cancer, staging, known metastatic lesion to cervical spine. EXAM: MRI HEAD WITHOUT AND WITH CONTRAST TECHNIQUE: Multiplanar, multiecho pulse sequences of the brain and surrounding  structures were obtained without and with intravenous contrast. CONTRAST:  10 cc of Gadavist.: COMPARISON:  Comparison made with prior MRI and CT of the cervical spine from 08/19/2018 and 08/20/2018. FINDINGS: Brain: Cerebral volume within normal limits for age. No evidence for acute or subacute infarct. Gray-white matter differentiation maintained without evidence for remote cortical infarction. No foci of susceptibility artifact to suggest acute or chronic intracranial hemorrhage. No hydrocephalus. No extra-axial fluid collection. Pituitary gland suprasellar region normal. Midline structures intact and normal. Multiple predominantly subcentimeter rim enhancing lesions seen scattered throughout the supratentorial and infratentorial brain, compatible with intracranial metastatic disease. Overall, there are approximately 10 lesions. Largest lesion within the left cerebral hemisphere positioned at the anterior mesial left temporal lobe in measures 11 mm (series 11, image 53). Largest lesion within the right cerebral hemisphere positioned at the high parasagittal right frontal lobe in measures 6 mm (series 11, image 132). Largest infratentorial lesion involves the right paramedian inferior pons and measures 6 mm (series 11, image 37). Mild localized vasogenic edema seen about a few lesions without significant regional mass effect. Associated edema seen extending through the right cerebral peduncle along the course of the corticospinal tract related to the brainstem lesions. No associated hemorrhage. No midline shift. Vascular: Major intracranial vascular flow voids are maintained. Skull and upper cervical spine: Craniocervical junction within normal limits. Sequelae of interval ACDF at C3-4 for known C3 metastasis noted. No other focal marrow replacing lesions. No scalp soft tissue abnormality. Sinuses/Orbits: Globes and orbital soft tissues within normal limits. Paranasal sinuses are largely clear. No significant  mastoid effusion. Fluid-filled air cell noted superior to the right internal auditory canal. Other: None. IMPRESSION: Scattered intracranial metastases involving the supratentorial and infratentorial brain as above. Overall, there are approximately 10 total lesions. Mild localized edema about a few lesions without significant mass effect. No associated hemorrhage. Electronically Signed   By: Jeannine Boga M.D.   On: 09/06/2018 01:17   Ct Abdomen Pelvis W Contrast  Result Date: 09/09/2018 CLINICAL DATA:  Newly diagnosed right lung non-small-cell carcinoma. Staging. EXAM: CT CHEST, ABDOMEN, AND PELVIS WITH CONTRAST TECHNIQUE: Multidetector CT imaging of the chest, abdomen and pelvis was performed following the standard protocol during bolus administration of intravenous contrast. CONTRAST:  176mL OMNIPAQUE IOHEXOL 300 MG/ML SOLN, 59mL OMNIPAQUE IOHEXOL 300 MG/ML SOLN COMPARISON:  None. FINDINGS: CT CHEST FINDINGS Cardiovascular: No acute findings. Mediastinum/Lymph Nodes: Mild mediastinal lymphadenopathy is seen in the pre-vascular, lateral aortic, right paratracheal, AP window, and subcarinal regions. Largest site in the subcarinal region measures 2.0 cm in short axis. Lungs/Pleura: Masslike opacity is seen in the central right upper lobe, which involves the right hilum and obstructs the right upper lobe bronchus. This measures approximately 7.3 x 5.0 cm,, but is difficult to differentiate from postobstructive collapse. A large right pleural effusion also results in compressive atelectasis. Musculoskeletal:  No suspicious bone lesions identified. CT ABDOMEN AND PELVIS FINDINGS Hepatobiliary: Multiple hypovascular masses are seen throughout the right and left lobes  measuring up to 3 cm, consistent with diffuse liver metastases. Tiny calcified gallstone is noted. No evidence of cholecystitis or biliary ductal dilatation Pancreas:  No mass or inflammatory changes. Spleen:  Within normal limits in size and  appearance. Adrenals/Urinary tract: Normal appearance of both adrenal glands. No evidence of renal masses or hydronephrosis. Unremarkable unopacified urinary bladder. Stomach/Bowel: No evidence of obstruction, inflammatory process, or abnormal fluid collections. Vascular/Lymphatic: Mild lymphadenopathy is seen in the porta hepatis, with largest lymph node measuring 1.4 cm. Multiple small sub-cm lymph nodes are seen abdominal retroperitoneum in the left paraaortic and aortocaval spaces. No pelvic lymphadenopathy identified. Aortic atherosclerosis. Reproductive:  No mass or other significant abnormality identified. Other:  None. Musculoskeletal: Numerous lytic bone metastases are seen throughout the spine, pelvis, and involving several anterior right ribs. IMPRESSION: 1. Large central right upper lobe mass, which involves the right hilum and obstructs the central right upper lobe bronchus, consistent with primary bronchogenic carcinoma. 2. Mild mediastinal lymphadenopathy, consistent with metastatic disease. 3. Large right pleural effusion, suspicious for malignant effusion. 4. Diffuse liver metastases. 5. Diffuse lytic bone metastases. 6. Mild abdominal porta hepatis and retroperitoneal lymphadenopathy, consistent with metastatic disease. Electronically Signed   By: Earle Gell M.D.   On: 09/09/2018 21:36   Portable Chest 1 View  Result Date: 09/23/2018 CLINICAL DATA:  Right pneumothorax. EXAM: PORTABLE CHEST 1 VIEW COMPARISON:  09/22/2018 FINDINGS: There is a persistent large right pneumothorax with further collapse of the right lower lobe. The right middle and upper lobes appear completely collapsed. There is a recurrent small right effusion. Left lung is clear. Heart size and pulmonary vascularity are normal. No evidence of tension pneumothorax. IMPRESSION: Persistent large right pneumothorax with further collapse of the right lower lobe and with complete collapse of the right middle and upper lobes. The  previous CT scan demonstrated a mass obstructing the upper lobe bronchus. Electronically Signed   By: Lorriane Shire M.D.   On: 09/23/2018 08:01   Dg Chest Port 1 View  Result Date: 09/22/2018 CLINICAL DATA:  Followup right pneumothorax. EXAM: PORTABLE CHEST 1 VIEW COMPARISON:  Earlier same day FINDINGS: Persistent right pneumothorax with complete collapse of the right lung. Pneumothorax is probably enlarging slightly, now estimated at 60-70%. No definite evidence of tension. IMPRESSION: Probable slight worsening of the right pneumothorax, estimated at 60-70%. Electronically Signed   By: Nelson Chimes M.D.   On: 09/22/2018 17:13   Dg Chest Port 1 View  Result Date: 09/22/2018 CLINICAL DATA:  Shortness of breath 2 days. Recently diagnosed with right-sided stage IV lung cancer. EXAM: PORTABLE CHEST 1 VIEW COMPARISON:  Chest CT 09/09/2018 FINDINGS: Exam demonstrates moderate opacification over the right lung compatible with moderate size effusion tracking to the apex. Findings are similar to patient's recent CT scan which demonstrated a patient's known large right upper lobe lung cancer with large right effusion. Borders of the known right upper lobe mass or not well visualized. Left lung is clear. Cardiomediastinal silhouette is within normal. Mild mottled sclerosis over the left scapula in the region of the glenoid suggesting metastatic disease. IMPRESSION: Moderate opacification over the right lung compatible with moderate size effusion likely with basilar atelectasis without significant change from recent CT. Patient's known medial right upper lobe lung cancer is not well visualized. Mottled sclerosis over the left scapula/glenoid suggesting metastatic disease. Electronically Signed   By: Marin Olp M.D.   On: 09/22/2018 12:19   Dg C-arm 1-60 Min  Result Date: 08/29/2018 CLINICAL DATA:  C3  corpectomy with C2 through C4 plate. EXAM: DG CERVICAL SPINE - 1 VIEW; DG C-ARM 61-120 MIN COMPARISON:  None.  FINDINGS: Intraoperative lateral view of the cervical spine status post C3 corpectomy with anterior cervical plate and screw fixation from C2 through C4 is identified. A total of 18 seconds of fluoroscopic time was utilized. Fine bony detail is limited due to the C-arm fluoroscopic technique. No immediate intraoperative complications are apparent. IMPRESSION: C3 corpectomy with anterior cervical fusion from C2 through C4. A total of 18 seconds of fluoroscopic time was utilized. Electronically Signed   By: Ashley Royalty M.D.   On: 08/29/2018 19:05   Dg Esophagus W Single Cm (sol Or Thin Ba)  Result Date: 09/24/2018 CLINICAL DATA:  Dysphagia, with feeling of food getting stuck in upper esophagus. Recently diagnosed with metastatic lung cancer. EXAM: ESOPHOGRAM/BARIUM SWALLOW TECHNIQUE: Single contrast examination was performed using  thin barium. FLUOROSCOPY TIME:  Fluoroscopy Time:  2 minutes and 48 seconds Radiation Exposure Index (if provided by the fluoroscopic device): 50.1 mGy Number of Acquired Spot Images: 0 COMPARISON:  CT of 09/09/2018. FINDINGS: Exam was performed using a single-contrast technique, with the patient in an RAO position secondary to relative immobility and discomfort. Evaluation of esophageal peristalsis demonstrates an incomplete peristaltic wave with contrast stasis in the upper esophagus. Example on series 1 and 2. There are tertiary contractions with eventual passage of contrast in the lower esophagus. Full column evaluation of the esophagus demonstrates an approximately 3 cm segment of relatively mild esophageal underdistention, suspicious for mass-effect from thoracic adenopathy. This is identified at and just inferior to the carina, including on image 197/4. Also image 95/3. IMPRESSION: 1. 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 2. Mild limitations, as detailed above. 3. Mid esophageal underdistention, likely due to relatively mild mass effect from  mediastinal adenopathy. Electronically Signed   By: Abigail Miyamoto M.D.   On: 09/24/2018 15:14   US Thoracentesis Asp Pleural Space W/img Guide  Result Date: 09/22/2018 INDICATION: Patient with history of stage IV lung cancer, dyspnea, large right pleural effusion; request received for therapeutic right thoracentesis. EXAM: ULTRASOUND GUIDED THERAPEUTIC RIGHT THORACENTESIS MEDICATIONS: None COMPLICATIONS: None immediate. PROCEDURE: An ultrasound guided thoracentesis was thoroughly discussed with the patient and questions answered. The benefits, risks, alternatives and complications were also discussed. The patient understands and wishes to proceed with the procedure. Written consent was obtained. Ultrasound was performed to localize and mark an adequate pocket of fluid in the right chest. The area was then prepped and draped in the normal sterile fashion. 1% Lidocaine was used for local anesthesia. Under ultrasound guidance a 6 Fr Safe-T-Centesis catheter was introduced. Thoracentesis was performed. The catheter was removed and a dressing applied. FINDINGS: A total of approximately 1.9 liters of yellow fluid was removed. IMPRESSION: Successful ultrasound guided therapeutic right thoracentesis yielding 1.9 liters of pleural fluid. Follow-up chest x-ray revealed approximately 50-60% pneumothorax, most likely ex vacuo in origin with failed reexpansion of the lung due to tumor. Above findings discussed with Drs. Yamagata/Mohamed and patient's nurse. Patient currently stable. If symptoms worsen can obtain additional follow-up chest x-ray. Read by: Rowe Robert, PA-C Electronically Signed   By: Aletta Edouard M.D.   On: 09/22/2018 16:00    Microbiology: No results found for this or any previous visit (from the past 240 hour(s)).   Labs: Basic Metabolic Panel: Recent Labs  Lab 09/22/18 1135 09/23/18 0456 09/24/18 0513  NA 135 132* 132*  K 3.4* 3.5 3.4*  CL 93*  93* 93*  CO2 28 25 27   GLUCOSE 126* 119*  118*  BUN 13 13 12   CREATININE 0.73 0.68 0.66  CALCIUM 9.7 9.0 9.2   Liver Function Tests: Recent Labs  Lab 09/22/18 1135  AST 23  ALT 21  ALKPHOS 294*  BILITOT 0.6  PROT 8.0  ALBUMIN 3.6   No results for input(s): LIPASE, AMYLASE in the last 168 hours. No results for input(s): AMMONIA in the last 168 hours. CBC: Recent Labs  Lab 09/22/18 1135 09/23/18 0456 09/24/18 0513  WBC 21.1* 18.2* 20.3*  HGB 14.7 13.7 13.5  HCT 46.2* 42.9 42.5  MCV 83.2 83.3 82.5  PLT 263 239 260   Cardiac Enzymes: No results for input(s): CKTOTAL, CKMB, CKMBINDEX, TROPONINI in the last 168 hours. BNP: BNP (last 3 results) No results for input(s): BNP in the last 8760 hours.  ProBNP (last 3 results) No results for input(s): PROBNP in the last 8760 hours.  CBG: No results for input(s): GLUCAP in the last 168 hours.     Signed:  Nita Sells MD   [facility] 09/25/2018, 11:41 AM

## 2018-09-25 NOTE — Progress Notes (Signed)
Discharge instructions given to pt and all questions were answered.  

## 2018-09-25 NOTE — Plan of Care (Signed)

## 2018-09-25 NOTE — Progress Notes (Signed)
Attempted to ambulate pt without oxygen. Pt sat on the side of the bed and oxygen dropped from 96% to 90%. Pt sat on the side of the bed for about 3 minutes maintaning sats at 90%. Pt stood up and sats dropped to 79%. Pt attempted to ambulate but became dizzy and had to sit back on the bed. 2L of oxygen placed back on pt and sats began to increase back to 93%. Will continue to monitor.

## 2018-09-25 NOTE — Progress Notes (Signed)
SATURATION QUALIFICATIONS: (This note is used to comply with regulatory documentation for home oxygen)  Patient Saturations on Room Air at Rest = 93%  Patient Saturations on Room Air while Ambulating = 79%  Patient Saturations on 2 Liters of oxygen while Ambulating = 92%  Please briefly explain why patient needs home oxygen:Pt O2 decreases with ambulation.

## 2018-09-25 NOTE — TOC Transition Note (Signed)
Transition of Care [facility]) - CM/SW Discharge Note   Patient Details  Name: Mariah Diaz MRN: [id number] Date of Birth: 1960-10-18  [facility] of [facility]) CM/SW Contact:  Dessa Phi, RN Phone Number: 09/25/2018, 12:59 PM   Clinical Narrative:   Ordered for [place], & home 02-Per patient's health insurnce limited w/HHC agencies-[facility] able to accept for [facility] aware of d/c & HHC orders-patient in agreement. [facility] for dme-rep Brad aware of d/c & home 02 orders to deliver home 02 tank to rm prior d/c. No further CM needs.    Final next level of care: [place] Barriers to Discharge: No Barriers Identified   Patient Goals and CMS Choice Patient states their goals for this hospitalization and ongoing recovery are:: (go home) CMS Medicare.gov Compare Post Acute Care list provided to:: Patient Choice offered to / list presented to : Patient  Discharge Placement                       Discharge Plan and Services   Discharge Planning Services: CM Consult Post Acute Care Choice: Home Health          DME Arranged: Oxygen DME Agency: [facility] HH Arranged: PT, Nurse's Aide [place] Agency: [facility] (formerly [employer])   Social Determinants of Health (SDOH) Interventions     Readmission Risk Interventions No flowsheet data found.

## 2018-09-26 ENCOUNTER — Telehealth: Payer: Self-pay | Admitting: *Deleted

## 2018-09-26 ENCOUNTER — Observation Stay (HOSPITAL_COMMUNITY)
Admission: EM | Admit: 2018-09-26 | Discharge: 2018-09-28 | Disposition: A | Discharge status: Home / Self Care | Payer: Managed Care, Other (non HMO) | Attending: Internal Medicine | Admitting: Internal Medicine

## 2018-09-26 ENCOUNTER — Other Ambulatory Visit: Payer: Self-pay

## 2018-09-26 ENCOUNTER — Emergency Department (HOSPITAL_COMMUNITY): Payer: Managed Care, Other (non HMO)

## 2018-09-26 ENCOUNTER — Encounter (HOSPITAL_COMMUNITY): Payer: Self-pay | Admitting: *Deleted

## 2018-09-26 DIAGNOSIS — D72829 Elevated white blood cell count, unspecified: Secondary | ICD-10-CM | POA: Insufficient documentation

## 2018-09-26 DIAGNOSIS — M7989 Other specified soft tissue disorders: Secondary | ICD-10-CM

## 2018-09-26 DIAGNOSIS — Z87891 Personal history of nicotine dependence: Secondary | ICD-10-CM | POA: Diagnosis not present

## 2018-09-26 DIAGNOSIS — J90 Pleural effusion, not elsewhere classified: Secondary | ICD-10-CM | POA: Diagnosis not present

## 2018-09-26 DIAGNOSIS — C7931 Secondary malignant neoplasm of brain: Secondary | ICD-10-CM | POA: Insufficient documentation

## 2018-09-26 DIAGNOSIS — I82622 Acute embolism and thrombosis of deep veins of left upper extremity: Secondary | ICD-10-CM | POA: Insufficient documentation

## 2018-09-26 DIAGNOSIS — C3490 Malignant neoplasm of unspecified part of unspecified bronchus or lung: Secondary | ICD-10-CM | POA: Diagnosis not present

## 2018-09-26 DIAGNOSIS — Z79899 Other long term (current) drug therapy: Secondary | ICD-10-CM | POA: Diagnosis not present

## 2018-09-26 DIAGNOSIS — E871 Hypo-osmolality and hyponatremia: Secondary | ICD-10-CM | POA: Diagnosis not present

## 2018-09-26 DIAGNOSIS — J95811 Postprocedural pneumothorax: Secondary | ICD-10-CM

## 2018-09-26 DIAGNOSIS — I10 Essential (primary) hypertension: Secondary | ICD-10-CM | POA: Diagnosis not present

## 2018-09-26 DIAGNOSIS — J939 Pneumothorax, unspecified: Principal | ICD-10-CM | POA: Insufficient documentation

## 2018-09-26 DIAGNOSIS — Z9981 Dependence on supplemental oxygen: Secondary | ICD-10-CM | POA: Diagnosis not present

## 2018-09-26 DIAGNOSIS — J948 Other specified pleural conditions: Secondary | ICD-10-CM | POA: Diagnosis not present

## 2018-09-26 DIAGNOSIS — R680 Hypothermia, not associated with low environmental temperature: Secondary | ICD-10-CM | POA: Insufficient documentation

## 2018-09-26 DIAGNOSIS — C787 Secondary malignant neoplasm of liver and intrahepatic bile duct: Secondary | ICD-10-CM | POA: Insufficient documentation

## 2018-09-26 DIAGNOSIS — F329 Major depressive disorder, single episode, unspecified: Secondary | ICD-10-CM | POA: Diagnosis not present

## 2018-09-26 DIAGNOSIS — C7951 Secondary malignant neoplasm of bone: Secondary | ICD-10-CM | POA: Insufficient documentation

## 2018-09-26 DIAGNOSIS — K224 Dyskinesia of esophagus: Secondary | ICD-10-CM | POA: Insufficient documentation

## 2018-09-26 LAB — CBC WITH DIFFERENTIAL/PLATELET
Abs Immature Granulocytes: 0.14 10*3/uL — ABNORMAL HIGH (ref 0.00–0.07)
Basophils Absolute: 0.1 10*3/uL (ref 0.0–0.1)
Basophils Relative: 1 %
Eosinophils Absolute: 1.8 10*3/uL — ABNORMAL HIGH (ref 0.0–0.5)
Eosinophils Relative: 10 %
HCT: 52.2 % — ABNORMAL HIGH (ref 36.0–46.0)
Hemoglobin: 15.9 g/dL — ABNORMAL HIGH (ref 12.0–15.0)
Immature Granulocytes: 1 %
Lymphocytes Relative: 5 %
Lymphs Abs: 0.9 10*3/uL (ref 0.7–4.0)
MCH: 26.4 pg (ref 26.0–34.0)
MCHC: 30.5 g/dL (ref 30.0–36.0)
MCV: 86.6 fL (ref 80.0–100.0)
Monocytes Absolute: 1.1 10*3/uL — ABNORMAL HIGH (ref 0.1–1.0)
Monocytes Relative: 6 %
Neutro Abs: 13.6 10*3/uL — ABNORMAL HIGH (ref 1.7–7.7)
Neutrophils Relative %: 77 %
Platelets: 151 10*3/uL (ref 150–400)
RBC: 6.03 MIL/uL — ABNORMAL HIGH (ref 3.87–5.11)
RDW: 13.6 % (ref 11.5–15.5)
WBC: 17.6 10*3/uL — ABNORMAL HIGH (ref 4.0–10.5)
nRBC: 0 % (ref 0.0–0.2)

## 2018-09-26 LAB — BASIC METABOLIC PANEL
Anion gap: 15 (ref 5–15)
BUN: 14 mg/dL (ref 6–20)
CO2: 26 mmol/L (ref 22–32)
Calcium: 9.2 mg/dL (ref 8.9–10.3)
Chloride: 87 mmol/L — ABNORMAL LOW (ref 98–111)
Creatinine, Ser: 0.75 mg/dL (ref 0.44–1.00)
GFR calc Af Amer: >60 mL/min (ref >60)
GFR calc non Af Amer: >60 mL/min (ref >60)
Glucose, Bld: 120 mg/dL — ABNORMAL HIGH (ref 70–99)
Potassium: 3.9 mmol/L (ref 3.5–5.1)
Sodium: 128 mmol/L — ABNORMAL LOW (ref 135–145)

## 2018-09-26 LAB — TROPONIN I: Troponin I: 0.05 ng/mL (ref <0.03)

## 2018-09-26 MED ORDER — SODIUM CHLORIDE 0.9 % IV SOLN
INTRAVENOUS | Status: DC
  Administered 2018-09-26: 19:00:00 via INTRAVENOUS

## 2018-09-26 MED ORDER — MORPHINE SULFATE ER 30 MG PO TBCR
30.0000 mg | EXTENDED_RELEASE_TABLET | Freq: Two times a day (BID) | ORAL | Status: DC
  Administered 2018-09-26 – 2018-09-28 (×4): 30 mg via ORAL
  Filled 2018-09-26 (×4): qty 1

## 2018-09-26 MED ORDER — ONDANSETRON HCL 4 MG/2ML IJ SOLN
4.0000 mg | Freq: Once | INTRAMUSCULAR | Status: AC
  Administered 2018-09-26: 4 mg via INTRAVENOUS
  Filled 2018-09-26: qty 2

## 2018-09-26 MED ORDER — LIDOCAINE VISCOUS HCL 2 % MT SOLN
15.0000 mL | Freq: Once | OROMUCOSAL | Status: AC
  Administered 2018-09-26: 16:00:00 15 mL via ORAL
  Filled 2018-09-26: qty 15

## 2018-09-26 MED ORDER — ORAL CARE MOUTH RINSE
15.0000 mL | Freq: Two times a day (BID) | OROMUCOSAL | Status: DC
  Administered 2018-09-26 – 2018-09-28 (×4): 15 mL via OROMUCOSAL

## 2018-09-26 MED ORDER — ALUM & MAG HYDROXIDE-SIMETH 200-200-20 MG/5ML PO SUSP
30.0000 mL | Freq: Once | ORAL | Status: AC
  Administered 2018-09-26: 16:00:00 30 mL via ORAL
  Filled 2018-09-26: qty 30

## 2018-09-26 MED ORDER — SODIUM CHLORIDE 0.9 % IV BOLUS
1000.0000 mL | Freq: Once | INTRAVENOUS | Status: AC
  Administered 2018-09-26: 15:00:00 1000 mL via INTRAVENOUS

## 2018-09-26 MED ORDER — GUAIFENESIN-DM 100-10 MG/5ML PO SYRP
5.0000 mL | ORAL_SOLUTION | ORAL | Status: DC | PRN
  Administered 2018-09-26 – 2018-09-28 (×6): 5 mL via ORAL
  Filled 2018-09-26 (×5): qty 10

## 2018-09-26 MED ORDER — MORPHINE SULFATE (PF) 4 MG/ML IV SOLN
4.0000 mg | Freq: Once | INTRAVENOUS | Status: AC
  Administered 2018-09-26: 13:00:00 4 mg via INTRAVENOUS
  Filled 2018-09-26: qty 1

## 2018-09-26 NOTE — ED Notes (Signed)
Bed: WA20 Expected date:  Expected time:  Means of arrival:  Comments: SOB, recent dx Lung Ca

## 2018-09-26 NOTE — Telephone Encounter (Signed)
Received vm call from pt stating that she thinks she might need to go back to hospital.  Reviewed chart & pt d/c yest.  Called pt & she states she is sitting & having difficulty just saying yes to my questions due to her breathing.  She feels that she may have been d/c too early.  She is using O2 @ 3L/min.  She doesn't know if she has any fever but instructed to take.  Discussed with Sandi Mealy PA & then instructed pt to go to ED to be evaluated.  They can pursue thoracentesis if needed quicker that we can. Pt in agreement & instructed to mask & be very cautious with hand washing/sanitizing.

## 2018-09-26 NOTE — H&P (Signed)
History and Physical    Mariah Diaz ZLD:[id number] DOB: 1960-07-07 DOA: 09/26/2018  PCP: Orpah Melter, MD (Confirm with patient/family/NH records and if not entered, this has to be entered at [facility] point of entry) Patient coming from: Home  I have personally briefly reviewed patient's old medical records in [place]  Chief Complaint: Feeling weak and shortness of breath since last night  HPI: Mariah Diaz is a 58 y.o. 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 Earlie Server, with mets to liver and brain, hypertension recently admitted on 09/22/2018 and discharged on 9 April, 2020 presents today with generalized weakness and some shortness of breath since last night.  Patient underwent thoracentesis on Monday and she was found to have hydropneumothorax secondary to underlying lung cancer.  Unfortunately her right upper lobe and middle lobe has not expanded despite thoracentesis due to underlying lung cancer and chest tube placement is ruled out by IR.  Patient also reports some persistent nausea, vomiting and loose bowel movements since last night.  She denies any abdominal pain, dysuria, headache.  She reports some dizziness and lightheadedness on ambulating.  She denies any tingling or numbness of her extremities.  ED Course: Well to ED she is afebrile slightly tachycardic with heart rate of 101/min, slightly tachypneic with a respiratory rate of 24/min and labs were significant for a WBC count of 17.6 , hemoglobin of 15.9, troponin of 0.05, sodium of 128.  This x-ray showed persistent hydropneumothorax and some reexpansion of the right lower lobe lung tissue but The right upper lobe is known to be densely involved with tumor and would not be expected to re-expand. There is some expected reaccumulation of right pleural fluid since thoracentesis of moderate volume.   Was referred to hospitalist service for dizziness,  hyponatremia.  Review of Systems: As per HPI otherwise 10 point review of systems negative.    Past Medical History:  Diagnosis Date  . Depression   . Heart murmur    has not had in checked in "years"  . Hypertension   . No pertinent past medical history     Past Surgical History:  Procedure Laterality Date  . ANTERIOR CERVICAL CORPECTOMY N/A 08/29/2018   Procedure: Cervical 3 Corpectomy with Cervical 2 to Cervical 4 plate;  Surgeon: Erline Levine, MD;  Location: [place];  Service: Neurosurgery;  Laterality: N/A;  Cervical 3 Corpectomy with Cervical 2 to Cervical 4 plate  . KNEE ARTHROSCOPY WITH ANTERIOR CRUCIATE LIGAMENT (ACL) REPAIR  04/22/2012   Procedure: KNEE ARTHROSCOPY WITH ANTERIOR CRUCIATE LIGAMENT (ACL) REPAIR;  Surgeon: Hessie Dibble, MD;  Location: [place];  Service: Orthopedics;  Laterality: Right;  . TONSILLECTOMY       reports that she quit smoking about 16 years ago. She has never used smokeless tobacco. She reports current alcohol use of about 21.0 standard drinks of alcohol per week. She reports that she does not use drugs.  No Known Allergies  No Pertinent family history.   Prior to Admission medications   Medication Sig Start Date End Date Taking? Authorizing Provider  morphine (MS CONTIN) 30 MG 12 hr tablet Take 30 mg by mouth every 12 (twelve) hours.   Yes [provider]  morphine 10 MG/5ML solution Take 2.5 mLs (5 mg total) by mouth every 3 (three) hours as needed for moderate pain. 09/25/18   Nita Sells, MD    Physical Exam: not in distress  On 2lit of [place] oxygen.  Vitals:   09/26/18 1530 09/26/18 1545 09/26/18 1600 09/26/18 1635  BP: 121/86 (!) 124/101 (!) 136/93 (!) 138/107  Pulse: (!) 107 98 (!) 106 (!) 101  Resp: (!) 28 (!) 23 (!) 23 (!) 24  Temp:    98.1 F (36.7 C)  TempSrc:    Oral  SpO2: 93% 94% 98% 100%    Constitutional: NAD, calm, comfortable Vitals:   09/26/18 1530 09/26/18 1545 09/26/18 1600  09/26/18 1635  BP: 121/86 (!) 124/101 (!) 136/93 (!) 138/107  Pulse: (!) 107 98 (!) 106 (!) 101  Resp: (!) 28 (!) 23 (!) 23 (!) 24  Temp:    98.1 F (36.7 C)  TempSrc:    Oral  SpO2: 93% 94% 98% 100%   Eyes: PERRL, lids and conjunctivae normal ENMT: Mucous membranes are moist. Posterior pharynx clear of any exudate or lesions.Normal dentition.  Neck: normal, supple, no masses, no thyromegaly Respiratory: diminished breath sounds over the right side, no wheezing heard.  Cardiovascular: Regular rate and rhythm, no murmurs / rubs / gallops. No extremity edema. 2+ pedal pulses. No carotid bruits.  Abdomen: no tenderness, no masses palpated. No hepatosplenomegaly. Bowel sounds positive.  Musculoskeletal: no clubbing / cyanosis. No joint deformity upper and lower extremities. Good ROM, no contractures. Normal muscle tone.  Skin: no rashes, lesions, ulcers. No induration Neurologic: CN 2-12 grossly intact. Sensation intact, DTR normal. Strength 5/5 in all 4.  Psychiatric: Normal judgment and insight. Alert and oriented x 3. Normal mood.     Labs on Admission: I have personally reviewed following labs and imaging studies  CBC: Recent Labs  Lab 09/22/18 1135 09/23/18 0456 09/24/18 0513 09/26/18 1315  WBC 21.1* 18.2* 20.3* 17.6*  NEUTROABS  --   --   --  13.6*  HGB 14.7 13.7 13.5 15.9*  HCT 46.2* 42.9 42.5 52.2*  MCV 83.2 83.3 82.5 86.6  PLT 263 239 260 975   Basic Metabolic Panel: Recent Labs  Lab 09/22/18 1135 09/23/18 0456 09/24/18 0513 09/26/18 1410  NA 135 132* 132* 128*  K 3.4* 3.5 3.4* 3.9  CL 93* 93* 93* 87*  CO2 28 25 27 26   GLUCOSE 126* 119* 118* 120*  BUN 13 13 12 14   CREATININE 0.73 0.68 0.66 0.75  CALCIUM 9.7 9.0 9.2 9.2   GFR: CrCl cannot be calculated (Unknown ideal weight.). Liver Function Tests: Recent Labs  Lab 09/22/18 1135  AST 23  ALT 21  ALKPHOS 294*  BILITOT 0.6  PROT 8.0  ALBUMIN 3.6   No results for input(s): LIPASE, AMYLASE in the  last 168 hours. No results for input(s): AMMONIA in the last 168 hours. Coagulation Profile: Recent Labs  Lab 09/22/18 1135 09/23/18 0456  INR 1.8* 1.7*   Cardiac Enzymes: Recent Labs  Lab 09/26/18 1410  TROPONINI 0.05*   BNP (last 3 results) No results for input(s): PROBNP in the last 8760 hours. HbA1C: No results for input(s): HGBA1C in the last 72 hours. CBG: No results for input(s): GLUCAP in the last 168 hours. Lipid Profile: No results for input(s): CHOL, HDL, LDLCALC, TRIG, CHOLHDL, LDLDIRECT in the last 72 hours. Thyroid Function Tests: No results for input(s): TSH, T4TOTAL, FREET4, T3FREE, THYROIDAB in the last 72 hours. Anemia Panel: No results for input(s): VITAMINB12, FOLATE, FERRITIN, TIBC, IRON, RETICCTPCT in the last 72 hours. Urine analysis: No results found for: COLORURINE, APPEARANCEUR, LABSPEC, PHURINE, GLUCOSEU, HGBUR, BILIRUBINUR, KETONESUR, PROTEINUR, UROBILINOGEN, NITRITE, LEUKOCYTESUR  Radiological Exams on Admission:  Dg Chest 1 View  Result Date: 09/25/2018 CLINICAL DATA:  Aspiration into airway EXAM: CHEST  1 VIEW COMPARISON:  Two days ago FINDINGS: Large but diminished right pneumothorax. Moderate pleural effusion on the right with pulmonary opacification. There is pulmonary obstruction by CT. Comparatively clear left chest. Borderline heart size. IMPRESSION: Large right hydropneumothorax with decreasing gas component since 2 days ago. Electronically Signed   By: Monte Fantasia M.D.   On: 09/25/2018 07:48   Dg Chest Port 1 View  Addendum Date: 09/26/2018   ADDENDUM REPORT: 09/26/2018 14:08 ADDENDUM: The chest x-ray was reviewed at the request of Dr. Darl Householder for potential need for a chest tube. When compared to the 04/9 study, there is further interval expansion of the right lower lobe and compared to the 4/7 and 4/6 chest x-rays, there is considerably improved aeration of the right lower lung. The right upper lobe is known to be densely involved with tumor  and would not be expected to re-expand. There is some expected reaccumulation of right pleural fluid since thoracentesis of moderate volume. Electronically Signed   By: Aletta Edouard M.D.   On: 09/26/2018 14:08   Result Date: 09/26/2018 CLINICAL DATA:  Shortness of breath, cough, nausea and vomiting. EXAM: PORTABLE CHEST 1 VIEW COMPARISON:  Single-view of the chest 09/25/2018, 09/23/2018 and 09/22/2018. FINDINGS: Right hydropneumothorax seen on the prior exams persists and appears unchanged since the most recent study. The left lung is clear and well expanded. No left effusion. Heart size is normal. IMPRESSION: No change in a large right hydropneumothorax since the most recent exam. No new abnormality. Electronically Signed: By: Inge Rise M.D. 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 Julien Nordmann as outpatient.  Currently awaiting for molecular studies. Her chest x-ray showed slight improvement in the right lower lobe lung tissue expansion and further reaccumulation of pleural fluid. She is stable around 2 L of nasal cannula oxygen with sats greater than 92%. She is scheduled to get radiation markings and follow-up appointment on Monday. EDP Dr. Darl Householder discussed with IR who recommended against chest tube placement.    Hyponatremia Possibly secondary to non-small cell lung cancer.  Differential includes dehydration from persistent nausea vomiting and mild diarrhea. Gently hydrate overnight, recheck BMP tomorrow and if stable should be able to be discharged home.  Plan to get orthostatic vital signs tomorrow and work with physical therapy in the morning prior to discharge home.    Pretension Well-controlled   Leukocytosis She does not have fever, does not appear to be toxic does not have any  productive cough.  Continue to monitor  DVT prophylaxis: SCDs Code Status: full code.  Family Communication: None at bedside Disposition Plan: Pending clinical improvement Consults called: None Admission status: Observation   Hosie Poisson MD [facility] Pager [phone]  If 7PM-7AM, please contact night-coverage [email or web address] Password [facility]  09/26/2018, 4:43 PM

## 2018-09-26 NOTE — ED Triage Notes (Signed)
Transported by [facility] from home-- recently diagnosed with lung CA last month. Was d/c yesterday from this facility for same with oxygen. Patient is oxygen dependent with 3L. + SOB, cough, n/v.

## 2018-09-26 NOTE — Progress Notes (Signed)
PT Cancellation Note  Patient Details Name: Mariah Diaz MRN: [id number] DOB: 09-20-1960   Cancelled Treatment:    Reason Eval/Treat Not Completed: Other (comment) - Per RN, pt to be admitted shortly to 4th floor. RN and PT collectively felt it appropriate for PT eval to take place tomorrow. PT to continue to follow acutely.   Julien Girt, PT Acute Rehabilitation Services Pager [phone]  Office [phone]   [place] 09/26/2018, 4:16 PM

## 2018-09-26 NOTE — ED Provider Notes (Signed)
[place] DEPT Provider Note   CSN: [id number] Arrival date & time: 09/26/18  1235    History   Chief Complaint Chief Complaint  Patient presents with  . Shortness of Breath    HPI Mariah Diaz is a 58 y.o. female history of hypertension, lung cancer, she presented with shortness of breath.  Patient had recent IR guided thoracentesis performed 4 days ago and had an ex vacuo pneumothorax secondary to non-expanding lung from underlying lung cancer.  Was admitted to the hospital and IR felt that patient does not need any chest tube at that time.  Patient was sent home yesterday on 3 L nasal cannula.  Patient states that since she got discharged home, she has worsening shortness of breath.  She felt nauseated as well.  She had a esophagram in the hospital that showed some esophageal dysmotility likely from the pneumothorax.      The history is provided by the patient.    Past Medical History:  Diagnosis Date  . Depression   . Heart murmur    has not had in checked in "years"  . Hypertension   . No pertinent past medical history     Patient Active Problem List   Diagnosis Date Noted  . Pneumothorax 09/23/2018  . Pneumothorax, acute 09/22/2018  . Brain metastases ([place]) 09/10/2018  . Liver metastases ([place]) 09/10/2018  . 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 Bone metastasis ([place]) 08/23/2018  . Malignant neoplasm of unspecified part of unspecified bronchus or lung ([place]) 08/23/2018    Past Surgical History:  Procedure Laterality Date  . ANTERIOR CERVICAL CORPECTOMY N/A 08/29/2018   Procedure: Cervical 3 Corpectomy with Cervical 2 to Cervical 4 plate;  Surgeon: Erline Levine, MD;  Location: [place];  Service: Neurosurgery;  Laterality: N/A;  Cervical 3 Corpectomy with Cervical 2 to Cervical 4 plate  . KNEE ARTHROSCOPY WITH ANTERIOR CRUCIATE LIGAMENT (ACL) REPAIR  04/22/2012   Procedure: KNEE ARTHROSCOPY WITH ANTERIOR CRUCIATE LIGAMENT  (ACL) REPAIR;  Surgeon: Hessie Dibble, MD;  Location: [place];  Service: Orthopedics;  Laterality: Right;  . TONSILLECTOMY       OB History   No obstetric history on file.      Home Medications    Prior to Admission medications   Medication Sig Start Date End Date Taking? Authorizing Provider  ibuprofen (ADVIL,MOTRIN) 600 MG tablet Take 1 tablet by mouth every 8 (eight) hours as needed for moderate pain.  08/18/18   Orpah Melter, MD  morphine 10 MG/5ML solution Take 2.5 mLs (5 mg total) by mouth every 3 (three) hours as needed for moderate pain. 09/25/18   Nita Sells, MD  traZODone (DESYREL) 50 MG tablet Take 50 mg by mouth at bedtime as needed for sleep.     [provider]    Family History No family history on file.  Social History Social History   Tobacco Use  . Smoking status: Former Smoker    Last attempt to quit: 08/15/2002    Years since quitting: 16.1  . Smokeless tobacco: Never Used  Substance Use Topics  . Alcohol use: Yes    Alcohol/week: 21.0 standard drinks    Types: 21 Glasses of wine per week    Comment: daily  . Drug use: No     Allergies   Patient has no known allergies.   Review of Systems Review of Systems  Respiratory: Positive for shortness of breath.   All other systems  reviewed and are negative.    Physical Exam Updated Vital Signs BP (!) 144/92   Pulse (!) 102   Temp (!) 97.5 F (36.4 C) (Oral)   Resp 20   SpO2 100%   Physical Exam Vitals signs reviewed.  Constitutional:      Comments: tachypneic   HENT:     Head: Normocephalic.  Eyes:     Pupils: Pupils are equal, round, and reactive to light.  Neck:     Musculoskeletal: Normal range of motion.  Cardiovascular:     Comments: Tachycardic  Pulmonary:     Comments: Decrease breath sounds R side. Breath sounds present on the left. Tachypneic  Abdominal:     General: Bowel sounds are normal.     Palpations: Abdomen is soft.   Musculoskeletal: Normal range of motion.  Skin:    General: Skin is warm.     Capillary Refill: Capillary refill takes less than 2 seconds.  Neurological:     General: No focal deficit present.     Mental Status: She is oriented to person, place, and time.  Psychiatric:        Mood and Affect: Mood normal.        Behavior: Behavior normal.      ED Treatments / Results  Labs (all labs ordered are listed, but only abnormal results are displayed) Labs Reviewed  CBC WITH DIFFERENTIAL/PLATELET  BASIC METABOLIC PANEL  I-STAT TROPONIN, ED    EKG None  Radiology Dg Chest 1 View  Result Date: 09/25/2018 CLINICAL DATA:  Aspiration into airway EXAM: CHEST  1 VIEW COMPARISON:  Two days ago FINDINGS: Large but diminished right pneumothorax. Moderate pleural effusion on the right with pulmonary opacification. There is pulmonary obstruction by CT. Comparatively clear left chest. Borderline heart size. IMPRESSION: Large right hydropneumothorax with decreasing gas component since 2 days ago. Electronically Signed   By: Monte Fantasia M.D.   On: 09/25/2018 07:48   Dg Esophagus W Single Cm (sol Or Thin Ba)  Result Date: 09/24/2018 CLINICAL DATA:  Dysphagia, with feeling of food getting stuck in upper esophagus. Recently diagnosed with metastatic lung cancer. EXAM: ESOPHOGRAM/BARIUM SWALLOW TECHNIQUE: Single contrast examination was performed using  thin barium. FLUOROSCOPY TIME:  Fluoroscopy Time:  2 minutes and 48 seconds Radiation Exposure Index (if provided by the fluoroscopic device): 50.1 mGy Number of Acquired Spot Images: 0 COMPARISON:  CT of 09/09/2018. FINDINGS: Exam was performed using a single-contrast technique, with the patient in an RAO position secondary to relative immobility and discomfort. Evaluation of esophageal peristalsis demonstrates an incomplete peristaltic wave with contrast stasis in the upper esophagus. Example on series 1 and 2. There are tertiary contractions with eventual  passage of contrast in the lower esophagus. Full column evaluation of the esophagus demonstrates an approximately 3 cm segment of relatively mild esophageal underdistention, suspicious for mass-effect from thoracic adenopathy. This is identified at and just inferior to the carina, including on image 197/4. Also image 95/3. IMPRESSION: 1. 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 2. Mild limitations, as detailed above. 3. Mid esophageal underdistention, likely due to relatively mild mass effect from mediastinal adenopathy. Electronically Signed   By: Abigail Miyamoto M.D.   On: 09/24/2018 15:14    Procedures Procedures (including critical care time)  Medications Ordered in ED Medications  morphine 4 MG/ML injection 4 mg (4 mg Intravenous Given 09/26/18 1315)     Initial Impression / Assessment and Plan / ED Course  I have reviewed the  triage vital signs and the nursing notes.  Pertinent labs & imaging results that were available during my care of the patient were reviewed by me and considered in my medical decision making (see chart for details).       Mariah Diaz is a 58 y.o. female here with SOB. I think likely worsening pneumothorax. Will repeat CXR. Will likely consult IR again.   1 pm  I called Dr. Bethel Born, IR doctor on currently. He will review her CXR which showed persistent pneumothorax.   1:30 pm Dr. Bethel Born called back. Reviewed CXR and he felt that the pneumothorax improved. He doesn't recommend chest tube currently and felt that patient can be managed medically and recommend consult oncologist.   2 pm Called Dr. Alen Blew who is covering Dr. Inda Merlin. He states that patient is getting scheduled for chemo and radiation but doesn't need emergent oncologic intervention. Patient states that her oxygen doesn't work well at home and she is not comfortable going home. Since she had recent admission and may have a qualifying stay for nursing home, I  talked to [place] from case management, who will look at patient's recent admission records.   3:30 PM Still unable to tolerate PO. Sodium lower at 128. Trop 0.05 likely demand ischemia. Will admit for observation and possible placement. Has radiation scheduled on Monday.   Final Clinical Impressions(s) / ED Diagnoses   Final diagnoses:  None    ED Discharge Orders    None       Drenda Freeze, MD 09/26/18 1540

## 2018-09-26 NOTE — ED Notes (Signed)
ED TO INPATIENT HANDOFF REPORT  ED Nurse Name and Phone #: [phone] [facility] RN  S Name/Age/Gender Mariah Diaz 58 y.o. female Room/Bed: WA20/WA20  Code Status   Code Status: Prior  Home/SNF/Other Home Patient oriented to: self, place, time and situation Is this baseline? Yes   Triage Complete: Triage complete  Chief Complaint shortness of breath; nausea and vomiting  Triage Note Transported by [facility] from home-- recently diagnosed with lung CA last month. Was d/c yesterday from this facility for same with oxygen. Patient is oxygen dependent with 3L. + SOB, cough, n/v.    Allergies No Known Allergies  Level of Care/Admitting Diagnosis ED Disposition    ED Disposition Condition Comment   Admit  Hospital Area: [place] [[id number]]  Level of Care: Telemetry [5]  Admit to tele based on following criteria: Other see comments  Comments: HYPONATREMIA, LUNG CANCER WITH HYPOXIA  Diagnosis: Hyponatremia [962952]  Admitting Physician: Hosie Poisson [[id number]]  Attending Physician: Hosie Poisson [[id number]]  PT Class (Do Not Modify): Observation [104]  PT Acc Code (Do Not Modify): Observation [10022]       B Medical/Surgery History Past Medical History:  Diagnosis Date  . Depression   . Heart murmur    has not had in checked in "years"  . Hypertension   . No pertinent past medical history    Past Surgical History:  Procedure Laterality Date  . ANTERIOR CERVICAL CORPECTOMY N/A 08/29/2018   Procedure: Cervical 3 Corpectomy with Cervical 2 to Cervical 4 plate;  Surgeon: Erline Levine, MD;  Location: [place];  Service: Neurosurgery;  Laterality: N/A;  Cervical 3 Corpectomy with Cervical 2 to Cervical 4 plate  . KNEE ARTHROSCOPY WITH ANTERIOR CRUCIATE LIGAMENT (ACL) REPAIR  04/22/2012   Procedure: KNEE ARTHROSCOPY WITH ANTERIOR CRUCIATE LIGAMENT (ACL) REPAIR;  Surgeon: Hessie Dibble, MD;  Location: [place];  Service: Orthopedics;  Laterality:  Right;  . TONSILLECTOMY       A IV Location/Drains/Wounds Patient Lines/Drains/Airways Status   Active Line/Drains/Airways    Name:   Placement date:   Placement time:   Site:   Days:   Peripheral IV 09/26/18 Right Antecubital   09/26/18    1314    Antecubital   less than 1   Incision 04/22/12 Knee Right   04/22/12    1205     2348   Incision (Closed) 08/29/18 Neck   08/29/18    1839     28          Intake/Output Last 24 hours No intake or output data in the 24 hours ending 09/26/18 1612  Labs/Imaging Results for orders placed or performed during the hospital encounter of 09/26/18 (from the past 48 hour(s))  CBC with Differential/Platelet     Status: Abnormal   Collection Time: 09/26/18  1:15 PM  Result Value Ref Range   WBC 17.6 (H) 4.0 - 10.5 K/uL   RBC 6.03 (H) 3.87 - 5.11 MIL/uL   Hemoglobin 15.9 (H) 12.0 - 15.0 g/dL   HCT 52.2 (H) 36.0 - 46.0 %   MCV 86.6 80.0 - 100.0 fL   MCH 26.4 26.0 - 34.0 pg   MCHC 30.5 30.0 - 36.0 g/dL   RDW 13.6 11.5 - 15.5 %   Platelets 151 150 - 400 K/uL   nRBC 0.0 0.0 - 0.2 %   Neutrophils Relative % 77 %   Neutro Abs 13.6 (H) 1.7 - 7.7 K/uL   Lymphocytes Relative 5 %  Lymphs Abs 0.9 0.7 - 4.0 K/uL   Monocytes Relative 6 %   Monocytes Absolute 1.1 (H) 0.1 - 1.0 K/uL   Eosinophils Relative 10 %   Eosinophils Absolute 1.8 (H) 0.0 - 0.5 K/uL   Basophils Relative 1 %   Basophils Absolute 0.1 0.0 - 0.1 K/uL   Immature Granulocytes 1 %   Abs Immature Granulocytes 0.14 (H) 0.00 - 0.07 K/uL    Comment: Performed at [facility], [place] [address], [place], [place] [address]  Basic metabolic panel     Status: Abnormal   Collection Time: 09/26/18  2:10 PM  Result Value Ref Range   Sodium 128 (L) 135 - 145 mmol/L   Potassium 3.9 3.5 - 5.1 mmol/L   Chloride 87 (L) 98 - 111 mmol/L   CO2 26 22 - 32 mmol/L   Glucose, Bld 120 (H) 70 - 99 mg/dL   BUN 14 6 - 20 mg/dL   Creatinine, Ser 0.75 0.44 - 1.00 mg/dL   Calcium 9.2 8.9 -  10.3 mg/dL   GFR calc non Af Amer >60 >60 mL/min   GFR calc Af Amer >60 >60 mL/min   Anion gap 15 5 - 15    Comment: Performed at [facility], [place] [address] [place], [place] [address]  Troponin I -     Status: Abnormal   Collection Time: 09/26/18  2:10 PM  Result Value Ref Range   Troponin I 0.05 (HH) <0.03 ng/mL    Comment: CRITICAL RESULT CALLED TO, READ BACK BY AND VERIFIED WITH: Sharlyn Bologna. RN @1522  ON 04.10.2020 BY COHEN,K Performed at [facility], [place] [address], [place], [place] [address]    Dg Chest 1 View  Result Date: 09/25/2018 CLINICAL DATA:  Aspiration into airway EXAM: CHEST  1 VIEW COMPARISON:  Two days ago FINDINGS: Large but diminished right pneumothorax. Moderate pleural effusion on the right with pulmonary opacification. There is pulmonary obstruction by CT. Comparatively clear left chest. Borderline heart size. IMPRESSION: Large right hydropneumothorax with decreasing gas component since 2 days ago. Electronically Signed   By: Monte Fantasia M.D.   On: 09/25/2018 07:48   Dg Chest Port 1 View  Addendum Date: 09/26/2018   ADDENDUM REPORT: 09/26/2018 14:08 ADDENDUM: The chest x-ray was reviewed at the request of Dr. Darl Householder for potential need for a chest tube. When compared to the 04/9 study, there is further interval expansion of the right lower lobe and compared to the 4/7 and 4/6 chest x-rays, there is considerably improved aeration of the right lower lung. The right upper lobe is known to be densely involved with tumor and would not be expected to re-expand. There is some expected reaccumulation of right pleural fluid since thoracentesis of moderate volume. Electronically Signed   By: Aletta Edouard M.D.   On: 09/26/2018 14:08   Result Date: 09/26/2018 CLINICAL DATA:  Shortness of breath, cough, nausea and vomiting. EXAM: PORTABLE CHEST 1 VIEW COMPARISON:  Single-view of the chest 09/25/2018, 09/23/2018 and 09/22/2018. FINDINGS: Right  hydropneumothorax seen on the prior exams persists and appears unchanged since the most recent study. The left lung is clear and well expanded. No left effusion. Heart size is normal. IMPRESSION: No change in a large right hydropneumothorax since the most recent exam. No new abnormality. Electronically Signed: By: Inge Rise M.D. On: 09/26/2018 13:26    Pending Labs Unresulted Labs (From admission, onward)   None      Vitals/Pain Today's Vitals  09/26/18 1451 09/26/18 1500 09/26/18 1515 09/26/18 1530  BP:  (!) 117/106 (!) 119/106 121/86  Pulse:  (!) 103 (!) 108 (!) 107  Resp:  16 17 (!) 28  Temp:      TempSrc:      SpO2:  98% 100% 93%  PainSc: 0-No pain       Isolation Precautions No active isolations  Medications Medications  0.9 %  sodium chloride infusion (has no administration in time range)  morphine 4 MG/ML injection 4 mg (4 mg Intravenous Given 09/26/18 1315)  ondansetron (ZOFRAN) injection 4 mg (4 mg Intravenous Given 09/26/18 1450)  sodium chloride 0.9 % bolus 1,000 mL (1,000 mLs Intravenous New Bag/Given 09/26/18 1451)  alum & mag hydroxide-simeth (MAALOX/MYLANTA) 200-200-20 MG/5ML suspension 30 mL (30 mLs Oral Given 09/26/18 1536)    And  lidocaine (XYLOCAINE) 2 % viscous mouth solution 15 mL (15 mLs Oral Given 09/26/18 1536)    Mobility walks with person assist Low fall risk   Focused Assessments Pulmonary Assessment Handoff:  Lung sounds:   O2 Device: Room Air-- Nasal Cannula 3L        R Recommendations: See Admitting Provider Note  Report given to:   Additional Notes:

## 2018-09-27 ENCOUNTER — Observation Stay (HOSPITAL_BASED_OUTPATIENT_CLINIC_OR_DEPARTMENT_OTHER): Payer: Managed Care, Other (non HMO)

## 2018-09-27 DIAGNOSIS — M7989 Other specified soft tissue disorders: Secondary | ICD-10-CM | POA: Diagnosis not present

## 2018-09-27 DIAGNOSIS — R42 Dizziness and giddiness: Secondary | ICD-10-CM

## 2018-09-27 DIAGNOSIS — C3491 Malignant neoplasm of unspecified part of right bronchus or lung: Secondary | ICD-10-CM | POA: Diagnosis not present

## 2018-09-27 DIAGNOSIS — R5381 Other malaise: Secondary | ICD-10-CM

## 2018-09-27 DIAGNOSIS — E871 Hypo-osmolality and hyponatremia: Secondary | ICD-10-CM | POA: Diagnosis not present

## 2018-09-27 LAB — BASIC METABOLIC PANEL
Anion gap: 13 (ref 5–15)
BUN: 10 mg/dL (ref 6–20)
CO2: 23 mmol/L (ref 22–32)
Calcium: 8.7 mg/dL — ABNORMAL LOW (ref 8.9–10.3)
Chloride: 91 mmol/L — ABNORMAL LOW (ref 98–111)
Creatinine, Ser: 0.63 mg/dL (ref 0.44–1.00)
GFR calc Af Amer: >60 mL/min (ref >60)
GFR calc non Af Amer: >60 mL/min (ref >60)
Glucose, Bld: 121 mg/dL — ABNORMAL HIGH (ref 70–99)
Potassium: 4.3 mmol/L (ref 3.5–5.1)
Sodium: 127 mmol/L — ABNORMAL LOW (ref 135–145)

## 2018-09-27 LAB — CBC
HCT: 40 % (ref 36.0–46.0)
Hemoglobin: 12.9 g/dL (ref 12.0–15.0)
MCH: 27.2 pg (ref 26.0–34.0)
MCHC: 32.3 g/dL (ref 30.0–36.0)
MCV: 84.2 fL (ref 80.0–100.0)
Platelets: 204 10*3/uL (ref 150–400)
RBC: 4.75 MIL/uL (ref 3.87–5.11)
RDW: 13.3 % (ref 11.5–15.5)
WBC: 19.1 10*3/uL — ABNORMAL HIGH (ref 4.0–10.5)
nRBC: 0 % (ref 0.0–0.2)

## 2018-09-27 LAB — MAGNESIUM: Magnesium: 1.9 mg/dL (ref 1.7–2.4)

## 2018-09-27 MED ORDER — MECLIZINE HCL 25 MG PO TABS: 12.5000 mg | ORAL_TABLET | Freq: Three times a day (TID) | ORAL | Status: DC | PRN

## 2018-09-27 MED ORDER — ENOXAPARIN SODIUM 80 MG/0.8ML ~~LOC~~ SOLN
1.0000 mg/kg | Freq: Two times a day (BID) | SUBCUTANEOUS | Status: DC
  Administered 2018-09-27 – 2018-09-28 (×2): 70 mg via SUBCUTANEOUS
  Filled 2018-09-27 (×2): qty 0.8

## 2018-09-27 MED ORDER — SODIUM CHLORIDE 1 G PO TABS
1.0000 g | ORAL_TABLET | Freq: Two times a day (BID) | ORAL | Status: DC
  Administered 2018-09-27 – 2018-09-28 (×2): 1 g via ORAL
  Filled 2018-09-27 (×3): qty 1

## 2018-09-27 NOTE — Progress Notes (Signed)
ANTICOAGULATION CONSULT NOTE - Initial Consult  Pharmacy Consult for Enoxaparin  Indication: VTE treatment   No Known Allergies  Patient Measurements: Height: 5\' 6"  (167.6 cm) Weight: 149 lb 4 oz (67.7 kg) IBW/kg (Calculated) : 59.3  Vital Signs: BP: 122/85 (04/11 0948) Pulse Rate: 106 (04/11 0948)  Labs: Recent Labs    09/26/18 1315 09/26/18 1410 09/27/18 0751  HGB 15.9*  --  12.9  HCT 52.2*  --  40.0  PLT 151  --  204  CREATININE  --  0.75 0.63  TROPONINI  --  0.05*  --     Estimated Creatinine Clearance: 72.6 mL/min (by C-G formula based on SCr of 0.63 mg/dL).   Medical History: Past Medical History:  Diagnosis Date  . Depression   . Heart murmur    has not had in checked in "years"  . 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 Swelling of left upper extremity noted today. Upper extremity ultrasound + for DVT involving the left internal jugular veins, left subclavian veins, left axillary vein and left brachial veins. Pharmacy consulted for Enoxaparin dosing. SCr 0.63 with CrCl ~ 73 ml/min. Hgb, Pltc WNL.    Goal of Therapy:  Anti-Xa level 0.6-1 units/ml 4hrs after LMWH dose given Monitor platelets by anticoagulation protocol: Yes   Plan:   Enoxaparin 1mg /kg SQ q12h  Monitor renal function  Monitor CBC at least q72h  Watch closely for s/sx of bleeding    Lindell Spar, PharmD, BCPS Pager: [phone] 09/27/2018 6:04 PM

## 2018-09-27 NOTE — Progress Notes (Signed)
Left upper extremity venous duplex completed. Refer to "CV Proc" under chart review to view preliminary results. Critical results discussed with Abigail Butts, RN.  09/27/2018 5:57 PM Maudry Mayhew, MHA, RVT, RDCS, RDMS

## 2018-09-27 NOTE — Progress Notes (Signed)
Assumed care of patient at this time. Will continue to monitor.

## 2018-09-27 NOTE — Progress Notes (Signed)
Have noted patient's left arm to be swollen this morning, which pt states was from an IV infiltrating last night. Arm seems to be more swollen and pt states is "difficult to use".  Pt requesting heat packs. Alerted MD.

## 2018-09-27 NOTE — Progress Notes (Addendum)
PROGRESS NOTE  Mariah Diaz SEG:[id number] DOB: 1961/02/09 DOA: 09/26/2018 PCP: Orpah Melter, MD   LOS: 0 days   Brief narrative: Mariah Diaz is a 58 y.o. 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  Patient underwent thoracentesis on Monday and she was found to have hydropneumothorax secondary to underlying lung cancer.  Unfortunately, her right upper lobe and middle lobe has not expanded despite thoracentesis due to underlying lung cancer and chest tube placement is ruled out by IR.  Patient also reported some persistent nausea, vomiting and loose bowel movements 1 day.   Patient also complained of dizziness and lightheadedness on ambulating.    In the ED, patient was afebrile slightly, tachycardic with heart rate of 101/min, slightly tachypneic with a respiratory rate of 24/min and labs were significant for a WBC count of 17.6 , hemoglobin of 15.9, troponin of 0.05, sodium of 128.    Chest x-ray showed persistent hydropneumothorax and some reexpansion of the right lower lobe lung tissue right upper lobe is known to be densely involved with tumor and would not be expected to re-expand.   Subjective: Still complains of dizziness, lightheadedness.  Has mild shortness of breath.  Lying on the right side  Assessment/Plan:  Active Problems:   Hyponatremia  Metastatic non small cell lung cancer with mets to brain and liver, bone.  Patient has been followed by Dr. Ave Filter as outpatient. Currently awaiting for molecular studies.  Right sided hydropneumothorax.  Chest x-ray showed slight improvement in tissue expansion.  Patient is currently on 3 L of nasal cannula at home.  Patient is supposed to get radiation treatment on Monday.  Interventional radiology  was consulted but recommend against chest tube placement.  Hyponatremia Patient received IV fluid hydration.  Sodium levels are still down.  Possibility of SIADH.  Appears to be hydrated at this time.  Will patient on salt tablets, put 1500 mL fluid restriction.  Check BMP in a.m.    Dizziness, vertigo.  We will add meclizine. Monitor sodium levels.  Patient was not orthostatic.  Could be secondary to brain metastasis.  Metastatic brain lesions were noted from MRI of 09/15/2018  Essential hypertension. Blood pressure is stable at this time.  Leukocytosis.  Trending up. Has mild hypothermia and tachycardia.  We will have to closely monitor. She is not on steroids.  Check lactate.  Check urinalysis.  Addendum:  09/27/2018 6:00 PM  Nursing staff reported that the patient had swelling of the left upper extremity.  On examination grossly swollen left upper extremity.  No external erythema.  Suspicious of DVT.  I ordered upper extremity DVT ultrasound which revealed deep vein thrombosis of the left upper extremity deep veins including internal jugular, left subclavian, left axillary and brachial vein.  I informed the patient about it and will start the patient on therapeutic Lovenox.  VTE Prophylaxis: add lovenox  Code Status: Code  Family Communication: No one available at bedside  Disposition Plan: Home likely in 1 to 2 days.  Labs in a.m.  Get physical therapy evaluation.  CBC closely.  Consultants:  Oncology notified  Procedures:  None  Antibiotics: Anti-infectives (From admission, onward)   None     Objective: Vitals:   09/27/18 0946 09/27/18 0948  BP: (!) 136/100 122/85  Pulse: 99 (!)  106  Resp: 16 18  Temp:    SpO2: 98% 99%    Intake/Output Summary (Last 24 hours) at 09/27/2018 1333 Last data filed at 09/27/2018 0511 Gross per 24 hour  Intake 1141.47 ml  Output -  Net 1141.47 ml   Filed Weights   09/26/18 1713  Weight: 67.7 kg   Body mass index is 24.09  kg/m.   Physical Exam: GENERAL: Patient is alert awake and oriented. Not in obvious distress.  On nasal cannula 3 L/min HENT: No scleral pallor or icterus. Pupils equally reactive to light. Oral mucosa is moist NECK: is supple, no palpable thyroid enlargement. CHEST: Diminished breath sounds mostly on the right side. No obvious wheezing noted.  CVS: S1 and S2 heard, no murmur. Regular rate and rhythm. No pericardial rub. ABDOMEN: Soft, non-tender, bowel sounds are present. No palpable hepato-splenomegaly. EXTREMITIES: Bilateral lower extremity pedal edema CNS: Cranial nerves are intact. No focal motor or sensory deficits. SKIN: warm and dry without rashes.  Data Review: I have personally reviewed the following laboratory data and studies,  CBC: Recent Labs  Lab 09/22/18 1135 09/23/18 0456 09/24/18 0513 09/26/18 1315 09/27/18 0751  WBC 21.1* 18.2* 20.3* 17.6* 19.1*  NEUTROABS  --   --   --  13.6*  --   HGB 14.7 13.7 13.5 15.9* 12.9  HCT 46.2* 42.9 42.5 52.2* 40.0  MCV 83.2 83.3 82.5 86.6 84.2  PLT 263 239 260 151 373   Basic Metabolic Panel: Recent Labs  Lab 09/22/18 1135 09/23/18 0456 09/24/18 0513 09/26/18 1410 09/27/18 0751  NA 135 132* 132* 128* 127*  K 3.4* 3.5 3.4* 3.9 4.3  CL 93* 93* 93* 87* 91*  CO2 28 25 27 26 23   GLUCOSE 126* 119* 118* 120* 121*  BUN 13 13 12 14 10   CREATININE 0.73 0.68 0.66 0.75 0.63  CALCIUM 9.7 9.0 9.2 9.2 8.7*  MG  --   --   --   --  1.9   Liver Function Tests: Recent Labs  Lab 09/22/18 1135  AST 23  ALT 21  ALKPHOS 294*  BILITOT 0.6  PROT 8.0  ALBUMIN 3.6   No results for input(s): LIPASE, AMYLASE in the last 168 hours. No results for input(s): AMMONIA in the last 168 hours. Cardiac Enzymes: Recent Labs  Lab 09/26/18 1410  TROPONINI 0.05*   BNP (last 3 results) No results for input(s): BNP in the last 8760 hours.  ProBNP (last 3 results) No results for input(s): PROBNP in the last 8760 hours.  CBG: No results  for input(s): GLUCAP in the last 168 hours. No results found for this or any previous visit (from the past 240 hour(s)).   Studies: Dg Chest Port 1 View  Addendum Date: 09/26/2018   ADDENDUM REPORT: 09/26/2018 14:08 ADDENDUM: The chest x-ray was reviewed at the request of Dr. Darl Householder for potential need for a chest tube. When compared to the 04/9 study, there is further interval expansion of the right lower lobe and compared to the 4/7 and 4/6 chest x-rays, there is considerably improved aeration of the right lower lung. The right upper lobe is known to be densely involved with tumor and would not be expected to re-expand. There is some expected reaccumulation of right pleural fluid since thoracentesis of moderate volume. Electronically Signed   By: Aletta Edouard M.D.   On: 09/26/2018 14:08   Result Date: 09/26/2018 CLINICAL DATA:  Shortness of breath, cough, nausea and vomiting. EXAM: PORTABLE CHEST 1 VIEW COMPARISON:  Single-view of the chest 09/25/2018, 09/23/2018 and 09/22/2018. FINDINGS: Right hydropneumothorax seen on the prior exams persists and appears unchanged since the most recent study. The left lung is clear and well expanded. No left effusion. Heart size is normal. IMPRESSION: No change in a large right hydropneumothorax since the most recent exam. No new abnormality. Electronically Signed: By: Inge Rise M.D. On: 09/26/2018 13:26    Scheduled Meds: . mouth rinse  15 mL Mouth Rinse BID  . morphine  30 mg Oral Q12H  . sodium chloride  1 g Oral BID WC    Continuous Infusions:   Flora Lipps, MD  [facility] 09/27/2018

## 2018-09-27 NOTE — Progress Notes (Signed)
FYI--Pt requesting [place] aide and RN.

## 2018-09-27 NOTE — Evaluation (Signed)
Physical Therapy Evaluation Patient Details Name: Mariah Diaz MRN: [id number] DOB: 1960-09-02 Today's Date: 09/27/2018   History of Present Illness  58 y.o. female with medical history significant of stage IV metastatic non-small cell lung cancer follows up with Mariah Diaz, with mets to liver and brain, hypertension recently admitted on 09/22/2018 and discharged on 9 April, 2020 presents today with generalized weakness and some shortness of breath.  Patient underwent thoracentesis on 09/22/18 and she was found to have hydropneumothorax secondary to underlying lung cancer.  Unfortunately her right upper lobe and middle lobe has not expanded despite thoracentesis due to underlying lung cancer and chest tube placement is ruled out by IR.  Patient also reports some persistent nausea, vomiting and loose bowel movements. Dx of hyponatremia.    Clinical Impression  Pt admitted with above diagnosis. Pt currently with functional limitations due to the deficits listed below (see PT Problem List). Pt independently transferred bed to recliner, SaO2 99% on 3L O2. She declined ambulation 2* to fatigue. She stated she will be able to ambulate short distances at home, but did not feel up to attempting to do so at present. 4 wheeled RW recommended for energy conservation. Instructed pt in BUE/LE strengthening exercises to be done independently. HHPT recommended.  Pt will benefit from skilled PT to increase their independence and safety with mobility to allow discharge to the venue listed below.       Follow Up Recommendations Home health PT    Equipment Recommendations  Other (comment)(4 wheeled RW (Rollator))    Recommendations for Other Services       Precautions / Restrictions Precautions Precautions: Other (comment) Precaution Comments: monitor O2 Restrictions Weight Bearing Restrictions: No      Mobility  Bed Mobility Overal bed mobility: Modified Independent             General bed  mobility comments: used rail, HOB up  Transfers Overall transfer level: Modified independent               General transfer comment: stand pivot transfer bed to recliner, SaO2 99% on 3L O2 with activity  Ambulation/Gait             General Gait Details: deferred 2* pt fatigue, she stated she feels she will be able to ambulate short distances at home  Stairs            Wheelchair Mobility    Modified Rankin (Stroke Patients Only)       Balance Overall balance assessment: Independent                                           Pertinent Vitals/Pain Pain Assessment: No/denies pain    Home Living Family/patient expects to be discharged to:: Private residence Living Arrangements: Alone Available Help at Discharge: Friend(s) Type of Home: House Home Access: Stairs to enter Entrance Stairs-Rails: Can reach both Entrance Stairs-Number of Steps: 3 Home Layout: One level Home Equipment: Crutches;Shower seat;Hand held shower head      Prior Function Level of Independence: Independent         Comments: pt reports she has friends who can assist with driving     Hand Dominance   Dominant Hand: Right    Extremity/Trunk Assessment   Upper Extremity Assessment Upper Extremity Assessment: LUE deficits/detail(LUE edematous 2* IV infiltration) LUE Deficits / Details: shoulder flexion AROM ~90*,  limited due to edema from infiltration    Lower Extremity Assessment Lower Extremity Assessment: Overall WFL for tasks assessed    Cervical / Trunk Assessment Cervical / Trunk Assessment: Normal  Communication   Communication: No difficulties  Cognition Arousal/Alertness: Awake/alert Behavior During Therapy: WFL for tasks assessed/performed Overall Cognitive Status: Within Functional Limits for tasks assessed                                        General Comments      Exercises General Exercises - Lower Extremity Ankle  Circles/Pumps: AROM;Both;10 reps;Seated Long Arc Quad: AROM;Both;5 reps;Seated Hip Flexion/Marching: (demonstrated to pt, to be done independently)   Assessment/Plan    PT Assessment Patient needs continued PT services  PT Problem List Decreased activity tolerance;Decreased mobility       PT Treatment Interventions DME instruction;Gait training;Therapeutic exercise;Therapeutic activities;Patient/family education    PT Goals (Current goals can be found in the Care Plan section)  Acute Rehab PT Goals Patient Stated Goal: return home, be able to tolerate increased activity PT Goal Formulation: With patient Time For Goal Achievement: 10/11/18 Potential to Achieve Goals: Fair    Frequency Min 3X/week   Barriers to discharge        Co-evaluation               AM-PAC PT "6 Clicks" Mobility  Outcome Measure Help needed turning from your back to your side while in a flat bed without using bedrails?: None Help needed moving from lying on your back to sitting on the side of a flat bed without using bedrails?: None Help needed moving to and from a bed to a chair (including a wheelchair)?: None Help needed standing up from a chair using your arms (e.g., wheelchair or bedside chair)?: None Help needed to walk in hospital room?: A Little Help needed climbing 3-5 steps with a railing? : A Little 6 Click Score: 22    End of Session Equipment Utilized During Treatment: Oxygen Activity Tolerance: Patient tolerated treatment well;Patient limited by fatigue Patient left: in chair;with call bell/phone within reach Nurse Communication: Mobility status PT Visit Diagnosis: Difficulty in walking, not elsewhere classified (R26.2)    Time: 7829-5621 PT Time Calculation (min) (ACUTE ONLY): 31 min   Charges:   PT Evaluation $PT Eval Low Complexity: 1 Low PT Treatments $Therapeutic Exercise: 8-22 mins        Blondell Reveal Kistler PT 09/27/2018  Acute Rehabilitation  Services Pager [phone] Office [phone]

## 2018-09-28 DIAGNOSIS — E871 Hypo-osmolality and hyponatremia: Secondary | ICD-10-CM | POA: Diagnosis not present

## 2018-09-28 DIAGNOSIS — C3490 Malignant neoplasm of unspecified part of unspecified bronchus or lung: Secondary | ICD-10-CM

## 2018-09-28 DIAGNOSIS — C7931 Secondary malignant neoplasm of brain: Secondary | ICD-10-CM | POA: Diagnosis not present

## 2018-09-28 DIAGNOSIS — I82622 Acute embolism and thrombosis of deep veins of left upper extremity: Secondary | ICD-10-CM

## 2018-09-28 DIAGNOSIS — C7951 Secondary malignant neoplasm of bone: Secondary | ICD-10-CM | POA: Diagnosis not present

## 2018-09-28 LAB — CBC
HCT: 38.1 % (ref 36.0–46.0)
Hemoglobin: 12.2 g/dL (ref 12.0–15.0)
MCH: 26.3 pg (ref 26.0–34.0)
MCHC: 32 g/dL (ref 30.0–36.0)
MCV: 82.1 fL (ref 80.0–100.0)
Platelets: 259 10*3/uL (ref 150–400)
RBC: 4.64 MIL/uL (ref 3.87–5.11)
RDW: 13.2 % (ref 11.5–15.5)
WBC: 18.4 10*3/uL — ABNORMAL HIGH (ref 4.0–10.5)
nRBC: 0 % (ref 0.0–0.2)

## 2018-09-28 LAB — BASIC METABOLIC PANEL
Anion gap: 12 (ref 5–15)
BUN: 8 mg/dL (ref 6–20)
CO2: 26 mmol/L (ref 22–32)
Calcium: 9 mg/dL (ref 8.9–10.3)
Chloride: 89 mmol/L — ABNORMAL LOW (ref 98–111)
Creatinine, Ser: 0.61 mg/dL (ref 0.44–1.00)
GFR calc Af Amer: >60 mL/min (ref >60)
GFR calc non Af Amer: >60 mL/min (ref >60)
Glucose, Bld: 112 mg/dL — ABNORMAL HIGH (ref 70–99)
Potassium: 4.3 mmol/L (ref 3.5–5.1)
Sodium: 127 mmol/L — ABNORMAL LOW (ref 135–145)

## 2018-09-28 MED ORDER — BENZONATATE 100 MG PO CAPS
200.0000 mg | ORAL_CAPSULE | Freq: Three times a day (TID) | ORAL | Status: DC | PRN
  Administered 2018-09-28: 200 mg via ORAL
  Filled 2018-09-28: qty 2

## 2018-09-28 MED ORDER — SODIUM CHLORIDE 1 G PO TABS: 1.0000 g | ORAL_TABLET | Freq: Three times a day (TID) | ORAL | 0 refills | Status: AC

## 2018-09-28 MED ORDER — GUAIFENESIN-DM 100-10 MG/5ML PO SYRP: 5.0000 mL | ORAL_SOLUTION | ORAL | 0 refills | Status: AC | PRN

## 2018-09-28 MED ORDER — MECLIZINE HCL 12.5 MG PO TABS: 25.0000 mg | ORAL_TABLET | Freq: Three times a day (TID) | ORAL | 0 refills | Status: AC | PRN

## 2018-09-28 MED ORDER — ELIQUIS 5 MG VTE STARTER PACK: ORAL_TABLET | ORAL | 0 refills | Status: AC

## 2018-09-28 NOTE — Care Management (Signed)
     Home health agencies that serve [phone]. Your favorite home health agencies  [place] of Patient Care Rating Patient Survey Summary Rating  [place]  [phone] 3  out of 5 stars 4 out of [place]  [phone] 4 out of 5 stars 4 out of [place]  [phone] 4  out of 5 stars 4 out of [place]  [phone] 4  out of 5 stars 4 out of [place]  [phone] 4 out of 5 stars 4 out of 5 stars  [facility] [place]  [phone] 3  out of 5 stars 3 out of [place]  [phone] 3 out of 5 stars 3 out of [place]  [phone] 3  out of 5 stars 4 out of [place]  [phone] 5 out of 5 stars 3 out of [place] number Footnote as displayed on [place]  1 This agency provides services under a federal waiver program to non-traditional, chronic long term population.  2 This agency provides services to a special needs population.  3 Not Available.  4 The number of patient episodes for this measure is too small to report.  5 This measure currently does not have data or provider has been certified/recertified for less than 6 months.  6 The national average for this measure is not provided because of state-to-state differences in data collection.  7 Medicare is not displaying rates for this measure for any home health agency, because of an issue with the data.  8 There were problems with the data and they are being corrected.  9 Zero, or very few, patients met the survey's rules for inclusion. The scores shown, if any, reflect a very small number of surveys and may not accurately tell how an agency is doing.  10 Survey results are based on less  than 12 months of data.  11 Fewer than 70 patients completed the survey. Use the scores shown, if any, with caution as the number of surveys may be too low to accurately tell how an agency is doing.  12 No survey results are available for this period.  13 Data suppressed by CMS for one or more quarters.

## 2018-09-28 NOTE — TOC Initial Note (Signed)
Transition of Care [facility]) - Initial/Assessment Note    Patient Details  Name: Mariah Diaz MRN: [id number] Date of Birth: 1960-09-02  Transition of Care [facility]) CM/SW Contact:    Erenest Rasher, RN Phone Number: 09/28/2018, 12:20 PM  Clinical Narrative:                 Spoke to pt and she wants to delay HHPT. Contacted [facility] and they accepted pt for [facility]. Pt has oxygen at home. She has family and neighbors who can assist as needed.   1220 Received call from [facility] and they are not able to accept referral for HHPT due to payor. Faxed referral to [facility]. [facility] [address] Explained to pt [facility] [address]. Provided pt with Eliquis 30 day free trial card. Pt has prior auth that needs to be completed with [employer] for her next dose. Made pt aware.   Expected Discharge Plan: [place] Barriers to Discharge: No Barriers Identified   Patient Goals and CMS Choice Patient states their goals for this hospitalization and ongoing recovery are:: get stronger at home CMS Medicare.gov Compare Post Acute Care list provided to:: Patient Choice offered to / list presented to : Patient  Expected Discharge Plan and Services Expected Discharge Plan: [place] In-house Referral: NA Discharge Planning Services: CM Consult Post Acute Care Choice: Home Health, Durable Medical Equipment Living arrangements for the past 2 months: Single Family Home Expected Discharge Date: 09/28/18               DME Arranged: Gilford Rile rolling with seat DME Agency: [facility] HH Arranged: PT, Nurse's Aide [place] Agency: Other - See comment  Prior Living Arrangements/Services Living arrangements for the past 2 months: Single Family Home Lives with:: Self Patient language and need for interpreter reviewed:: Yes Do you feel safe going back to the place where you live?: Yes       Need for Family Participation in Patient Care: No (Comment) Care giver support system in place?: No (comment)(has family and [place] to help if needed) Current home services: DME, Home PT(oxygen) Criminal Activity/Legal Involvement Pertinent to Current Situation/Hospitalization: No - Comment as needed  Activities of Daily Living Home Assistive Devices/Equipment: None ADL Screening (condition at time of admission) Patient's cognitive ability adequate to safely complete daily activities?: Yes Is the patient deaf or have difficulty hearing?: No Does the patient have difficulty seeing, even when wearing glasses/contacts?: No Does the patient have difficulty concentrating, remembering, or making decisions?: No Patient able to express need for assistance with ADLs?: Yes Does the patient have difficulty dressing or bathing?: No Independently performs ADLs?: Yes (appropriate for developmental age) Does the patient have difficulty walking or climbing stairs?: No Weakness of Legs: None Weakness of Arms/Hands: None  Permission Sought/Granted Permission sought to share information with : Case Manager, PCP, Other (comment) Permission granted to share information with : Yes, Verbal Permission Granted              Emotional Assessment       Orientation: : Oriented to Self, Oriented to  Time, Oriented to Situation, Oriented to Place   Psych Involvement: No (comment)  Admission diagnosis:  Postprocedural pneumothorax [J95.811] Patient Active Problem List   Diagnosis Date Noted  . Hyponatremia 09/26/2018  . Pneumothorax 09/23/2018  . Pneumothorax, acute 09/22/2018  . Brain metastases ([place])  09/10/2018  . Liver metastases ([place]) 09/10/2018  . Metastatic cancer to spine ([place]) 08/29/2018  . Bone metastasis ([place]) 08/23/2018  . Malignant neoplasm of unspecified part of unspecified bronchus or lung ([place]) 08/23/2018   PCP:  Orpah Melter, MD Pharmacy[facility]   [id number] [place], [address] [place] [place] [address] Phone: [phone] Fax: [phone]     Social Determinants of Health (SDOH) Interventions    Readmission Risk Interventions No flowsheet data found.

## 2018-09-28 NOTE — Discharge Summary (Signed)
Physician Discharge Summary  Mariah Diaz OTL:[id number] DOB: 05-Aug-1960 DOA: 09/26/2018  PCP: Orpah Melter, MD  Admit date: 09/26/2018 Discharge date: 09/28/2018  Admitted From: Home  Discharge disposition: Home with home health   Recommendations for Outpatient Follow-Up:   Follow-up with radiation oncology on 09/29/2018.  Follow-up with Dr. Earlie Server oncology as has been scheduled by you.  Discharge Diagnosis:   Principal Problem:   Hyponatremia Active Problems:   Malignant neoplasm of unspecified part of unspecified bronchus or lung ([place])   Metastatic cancer to spine ([place])   Brain metastases ([place])     Discharge Condition: Improved.  Diet recommendation:  Regular.  Wound care: None.  Code status: Full.   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 Patient underwent thoracentesis on Monday and she was found to have hydropneumothorax secondary to underlying lung cancer. Unfortunately, her right upper lobe and middle lobe has not expanded despite thoracentesis due to underlying lung cancer and chest tube placement is ruled out by IR. Patient also reported some persistent nausea, vomiting and loose bowel movements 1 day.  Patient also complained of dizziness and lightheadedness on ambulating.     In the ED, patient was afebrile slightly, tachycardic with heart rate of 101/min, slightly tachypneic with a respiratory rate of 24/min and labs were significant for a WBC count of 17.6,hemoglobin of 15.9, troponin of 0.05, sodium of 128.  Chest x-ray showed persistent hydropneumothorax and some reexpansion of the right lower lobe lung tissue right upper lobe is known to  be densely involved with tumor and would not be expected to re-expand  Hospital Course:  Patient was placed in observation and following conditions were addressed during hospitalization,  Metastatic non small cell lung cancer with mets to brain and liver, bone.  Patient has been followed by Dr. Ave Filter as outpatient.Currently awaiting for molecular studies.  Patient will follow-up with radiation oncology 09/29/2018 for radiation treatment.  Left upper extremity DVT.  New diagnosis in the hospital.  Patient was 1 dose of Lovenox and will be discharged on Eliquis for anticoagulation.  Patient will follow-up with oncology as outpatient.  Risk versus benefits of anticoagulation was thoroughly explained to the patient including precautions on blood thinners.  Patient wishes to proceed with anticoagulation.  Right sided hydropneumothorax.  Chest x-ray showed slight improvement in tissue expansion.  Patient is currently on 3 L of nasal cannula at home.  Patient is supposed to get radiation treatment on Monday, 09/29/2018.  Interventional radiology was consulted but recommend against chest tube placement.  Patient remained stable overnight.  Advised to continue oxygen and proceed with radiation treatment  Hyponatremia Patient received IV fluid hydration easily but did not have improvement in her sodium levels.  Sodium levels are still down.  Possibility of SIADH.  Appears to be hydrated at this time.    Patient will be continued on salt tablets,1500 mL fluid restriction.    Patient otherwise remains stable.  BMP needs to be followed up in few days.   Dizziness, vertigo.  Likely secondary to metastatic brain lesions.  Patient was not orthostatic.  Metastatic brain lesions were noted from MRI of 09/15/2018.  Meclizine has been added to the regimen.  Essential hypertension. Blood pressure remained stable.  Leukocytosis.  No signs of infection noted.     Need follow-up as outpatient.  Debility  deconditioning weakness.  Patient was seen by physical therapy.  Recommend home health PT and nursing on discharge.  Disposition at this time, patient is considered stable for disposition home.  Medical Consultants:    None.   Subjective:   Today, patient feels better.  Wishes to go home.  Denies any dizziness.  No nausea vomiting.  No shortness of breath more than usual.  Discharge Exam:   Vitals:   09/27/18 2038 09/28/18 0459  BP: (!) 134/96 139/90  Pulse: 99 94  Resp: 16 18  Temp: (!) 97.5 F (36.4 C) 98.5 F (36.9 C)  SpO2: 96% 94%   Vitals:   09/27/18 0946 09/27/18 0948 09/27/18 2038 09/28/18 0459  BP: (!) 136/100 122/85 (!) 134/96 139/90  Pulse: 99 (!) 106 99 94  Resp: 16 18 16 18   Temp:   (!) 97.5 F (36.4 C) 98.5 F (36.9 C)  TempSrc:   Oral Oral  SpO2: 98% 99% 96% 94%  Weight:      Height:        General exam: Appears calm and comfortable ,Not in distress.  Nasal cannula oxygen. HEENT:PERRL,Oral mucosa moist Respiratory system: Diminished breath sounds mostly on the right side. Cardiovascular system: S1 & S2 heard, RRR.  Gastrointestinal system: Abdomen is nondistended, soft and nontender. No organomegaly or masses felt. Normal bowel sounds heard. Central nervous system: Alert and oriented. No focal neurological deficits. Extremities: No edema, no clubbing ,no cyanosis, distal peripheral pulses palpable. Skin: No rashes, lesions or ulcers,no icterus ,no pallor MSK: Normal muscle bulk,tone ,power    Procedures:    none  The results of significant diagnostics from this hospitalization (including imaging, microbiology, ancillary and laboratory) are listed below for reference.     Diagnostic Studies:   Dg Chest Port 1 View  Addendum Date: 09/26/2018   ADDENDUM REPORT: 09/26/2018 14:08 ADDENDUM: The chest x-ray was reviewed at the request of Dr. Darl Householder for potential need for a chest tube. When compared to the 04/9 study, there is further interval  expansion of the right lower lobe and compared to the 4/7 and 4/6 chest x-rays, there is considerably improved aeration of the right lower lung. The right upper lobe is known to be densely involved with tumor and would not be expected to re-expand. There is some expected reaccumulation of right pleural fluid since thoracentesis of moderate volume. Electronically Signed   By: Aletta Edouard M.D.   On: 09/26/2018 14:08   Result Date: 09/26/2018 CLINICAL DATA:  Shortness of breath, cough, nausea and vomiting. EXAM: PORTABLE CHEST 1 VIEW COMPARISON:  Single-view of the chest 09/25/2018, 09/23/2018 and 09/22/2018. FINDINGS: Right hydropneumothorax seen on the prior exams persists and appears unchanged since the most recent study. The left lung is clear and well expanded. No left effusion. Heart size is normal. IMPRESSION: No change in a large right hydropneumothorax since the most recent exam. No new abnormality. Electronically Signed: By: Inge Rise M.D. On: 09/26/2018 13:26   Vas [place] Upper Extremity Venous Duplex  Result Date: 09/27/2018 UPPER VENOUS STUDY  Indications: Pain, Swelling, and Recent IV LUE Performing Technologist: Maudry Mayhew MHA, RDMS, RVT, RDCS  Examination Guidelines: A complete evaluation includes B-mode imaging, spectral Doppler, color Doppler, and power Doppler as needed of all accessible portions of each vessel. Bilateral testing is considered an integral part of a complete examination. Limited examinations for reoccurring indications may be  performed as noted.  Right Findings: +----------+------------+---------+-----------+----------+-------+  RIGHT      Compressible Phasicity Spontaneous Properties Summary  +----------+------------+---------+-----------+----------+-------+  Subclavian                 Yes        Yes                         +----------+------------+---------+-----------+----------+-------+  Left Findings:  +----------+------------+---------+-----------+----------+--------------+  LEFT       Compressible Phasicity Spontaneous Properties    Summary      +----------+------------+---------+-----------+----------+--------------+  IJV            None                   No                     Acute       +----------+------------+---------+-----------+----------+--------------+  Subclavian     None                   No                     Acute       +----------+------------+---------+-----------+----------+--------------+  Axillary       None                   No                     Acute       +----------+------------+---------+-----------+----------+--------------+  Brachial       None                   No                     Acute       +----------+------------+---------+-----------+----------+--------------+  Radial         Full                                                      +----------+------------+---------+-----------+----------+--------------+  Ulnar          Full                                                      +----------+------------+---------+-----------+----------+--------------+  Cephalic       Full                                                      +----------+------------+---------+-----------+----------+--------------+  Basilic                                                  Not visualized  +----------+------------+---------+-----------+----------+--------------+  Summary:  Right: No evidence of thrombosis in the subclavian.  Left: Findings consistent with acute deep vein thrombosis involving the left internal  jugular veins, left subclavian veins, left axillary vein and left brachial veins.  *See table(s) above for measurements and observations.    Preliminary      Labs:   Basic Metabolic Panel: Recent Labs  Lab 09/23/18 0456 09/24/18 0513 09/26/18 1410 09/27/18 0751 09/28/18 0719  NA 132* 132* 128* 127* 127*  K 3.5 3.4* 3.9 4.3 4.3  CL 93* 93* 87* 91* 89*  CO2 25 27 26 23 26     GLUCOSE 119* 118* 120* 121* 112*  BUN 13 12 14 10 8   CREATININE 0.68 0.66 0.75 0.63 0.61  CALCIUM 9.0 9.2 9.2 8.7* 9.0  MG  --   --   --  1.9  --    GFR Estimated Creatinine Clearance: 72.6 mL/min (by C-G formula based on SCr of 0.61 mg/dL). Liver Function Tests: Recent Labs  Lab 09/22/18 1135  AST 23  ALT 21  ALKPHOS 294*  BILITOT 0.6  PROT 8.0  ALBUMIN 3.6   No results for input(s): LIPASE, AMYLASE in the last 168 hours. No results for input(s): AMMONIA in the last 168 hours. Coagulation profile Recent Labs  Lab 09/22/18 1135 09/23/18 0456  INR 1.8* 1.7*    CBC: Recent Labs  Lab 09/23/18 0456 09/24/18 0513 09/26/18 1315 09/27/18 0751 09/28/18 0450  WBC 18.2* 20.3* 17.6* 19.1* 18.4*  NEUTROABS  --   --  13.6*  --   --   HGB 13.7 13.5 15.9* 12.9 12.2  HCT 42.9 42.5 52.2* 40.0 38.1  MCV 83.3 82.5 86.6 84.2 82.1  PLT 239 260 151 204 259   Cardiac Enzymes: Recent Labs  Lab 09/26/18 1410  TROPONINI 0.05*   BNP: Invalid input(s): POCBNP CBG: No results for input(s): GLUCAP in the last 168 hours. D-Dimer No results for input(s): DDIMER in the last 72 hours. Hgb A1c No results for input(s): HGBA1C in the last 72 hours. Lipid Profile No results for input(s): CHOL, HDL, LDLCALC, TRIG, CHOLHDL, LDLDIRECT in the last 72 hours. Thyroid function studies No results for input(s): TSH, T4TOTAL, T3FREE, THYROIDAB in the last 72 hours.  Invalid input(s): FREET3 Anemia work up No results for input(s): VITAMINB12, FOLATE, FERRITIN, TIBC, IRON, RETICCTPCT in the last 72 hours. Microbiology No results found for this or any previous visit (from the past 240 hour(s)).   Discharge Instructions:   Discharge Instructions    Call MD for:  persistant nausea and vomiting   Complete by:  As directed    Call MD for:  severe uncontrolled pain   Complete by:  As directed    Call MD for:  temperature >100.4   Complete by:  As directed    Diet general   Complete by:  As  directed    Discharge instructions   Complete by:  As directed    Follow-up with radiation oncology on 09/29/2018.  Follow-up with Dr. Julien Nordmann oncology as scheduled by you.  Take precautions on blood thinners.  CBC BMP in 3 to 5 days   Walker    Complete by:  As directed      Allergies as of 09/28/2018   No Known Allergies     Medication List    TAKE these medications   Eliquis DVT/PE Starter Pack 5 MG Tabs Take as directed on package: start with two-5mg  tablets twice daily for 7 days. On day 8, switch to one-5mg  tablet twice daily.   guaiFENesin-dextromethorphan 100-10 MG/5ML syrup Commonly known as:  ROBITUSSIN DM Take 5 mLs by mouth every 4 (four)  hours as needed for cough (chest congestion).   meclizine 12.5 MG tablet Commonly known as:  ANTIVERT Take 2 tablets (25 mg total) by mouth 3 (three) times daily as needed for dizziness or nausea.   morphine 30 MG 12 hr tablet Commonly known as:  MS CONTIN Take 30 mg by mouth every 12 (twelve) hours.   morphine 10 MG/5ML solution Take 2.5 mLs (5 mg total) by mouth every 3 (three) hours as needed for moderate pain.   sodium chloride 1 g tablet Take 1 tablet (1 g total) by mouth 3 (three) times daily with meals.            Durable Medical Equipment  (From admission, onward)         Start     Ordered   09/28/18 1014  For home use only DME Walker rolling  [facility])  Once    Question:  Patient needs a walker to treat with the following condition  Answer:  Debility   09/28/18 1019           Time coordinating discharge: 39 minutes  Signed:  Kohen Reither  [facility] 09/28/2018, 10:20 AM

## 2018-09-29 ENCOUNTER — Other Ambulatory Visit: Payer: Self-pay | Admitting: Radiation Oncology

## 2018-09-29 ENCOUNTER — Other Ambulatory Visit: Payer: Self-pay

## 2018-09-29 ENCOUNTER — Ambulatory Visit
Admission: RE | Admit: 2018-09-29 | Discharge: 2018-09-29 | Disposition: A | Discharge status: Home / Self Care | Payer: Managed Care, Other (non HMO) | Source: Ambulatory Visit | Attending: Radiation Oncology | Admitting: Radiation Oncology

## 2018-09-29 DIAGNOSIS — C7951 Secondary malignant neoplasm of bone: Secondary | ICD-10-CM | POA: Diagnosis not present

## 2018-09-29 DIAGNOSIS — C7931 Secondary malignant neoplasm of brain: Secondary | ICD-10-CM

## 2018-09-29 DIAGNOSIS — Z510 Encounter for antineoplastic radiation therapy: Secondary | ICD-10-CM | POA: Diagnosis not present

## 2018-09-29 DIAGNOSIS — C3490 Malignant neoplasm of unspecified part of unspecified bronchus or lung: Secondary | ICD-10-CM | POA: Diagnosis not present

## 2018-09-29 MED ORDER — LORAZEPAM 0.5 MG PO TABS
0.5000 mg | ORAL_TABLET | Freq: Once | ORAL | Status: AC
  Administered 2018-09-29: 0.5 mg via ORAL
  Filled 2018-09-29: qty 1

## 2018-09-29 MED ORDER — LORAZEPAM 0.5 MG PO TABS: 0.5000 mg | ORAL_TABLET | Freq: Three times a day (TID) | ORAL | 0 refills | Status: DC

## 2018-09-30 ENCOUNTER — Other Ambulatory Visit: Payer: Self-pay

## 2018-09-30 ENCOUNTER — Ambulatory Visit
Admission: RE | Admit: 2018-09-30 | Discharge: 2018-09-30 | Disposition: A | Discharge status: Home / Self Care | Payer: Managed Care, Other (non HMO) | Source: Ambulatory Visit | Attending: Radiation Oncology | Admitting: Radiation Oncology

## 2018-09-30 DIAGNOSIS — Z510 Encounter for antineoplastic radiation therapy: Secondary | ICD-10-CM | POA: Diagnosis not present

## 2018-10-01 ENCOUNTER — Ambulatory Visit
Admission: RE | Admit: 2018-10-01 | Discharge: 2018-10-01 | Disposition: A | Discharge status: Home / Self Care | Payer: Managed Care, Other (non HMO) | Source: Ambulatory Visit | Attending: Radiation Oncology | Admitting: Radiation Oncology

## 2018-10-01 ENCOUNTER — Other Ambulatory Visit: Payer: Self-pay | Admitting: Radiation Oncology

## 2018-10-01 ENCOUNTER — Other Ambulatory Visit: Payer: Self-pay

## 2018-10-01 DIAGNOSIS — Z510 Encounter for antineoplastic radiation therapy: Secondary | ICD-10-CM | POA: Diagnosis not present

## 2018-10-01 MED ORDER — MORPHINE SULFATE 15 MG PO TABS: 15.0000 mg | ORAL_TABLET | ORAL | 0 refills | Status: DC | PRN

## 2018-10-02 ENCOUNTER — Ambulatory Visit
Admission: RE | Admit: 2018-10-02 | Discharge: 2018-10-02 | Disposition: A | Discharge status: Home / Self Care | Payer: Managed Care, Other (non HMO) | Source: Ambulatory Visit | Attending: Radiation Oncology | Admitting: Radiation Oncology

## 2018-10-02 ENCOUNTER — Other Ambulatory Visit: Payer: Self-pay

## 2018-10-02 DIAGNOSIS — Z510 Encounter for antineoplastic radiation therapy: Secondary | ICD-10-CM | POA: Diagnosis not present

## 2018-10-03 ENCOUNTER — Ambulatory Visit
Admission: RE | Admit: 2018-10-03 | Discharge: 2018-10-03 | Disposition: A | Discharge status: Home / Self Care | Payer: Managed Care, Other (non HMO) | Source: Ambulatory Visit | Attending: Radiation Oncology | Admitting: Radiation Oncology

## 2018-10-03 ENCOUNTER — Other Ambulatory Visit: Payer: Self-pay

## 2018-10-03 DIAGNOSIS — Z510 Encounter for antineoplastic radiation therapy: Secondary | ICD-10-CM | POA: Diagnosis not present

## 2018-10-06 ENCOUNTER — Ambulatory Visit
Admission: RE | Admit: 2018-10-06 | Discharge: 2018-10-06 | Disposition: A | Discharge status: Home / Self Care | Payer: Managed Care, Other (non HMO) | Source: Ambulatory Visit | Attending: Radiation Oncology | Admitting: Radiation Oncology

## 2018-10-06 ENCOUNTER — Other Ambulatory Visit: Payer: Self-pay

## 2018-10-06 DIAGNOSIS — Z510 Encounter for antineoplastic radiation therapy: Secondary | ICD-10-CM | POA: Diagnosis not present

## 2018-10-07 ENCOUNTER — Other Ambulatory Visit: Payer: Self-pay

## 2018-10-07 ENCOUNTER — Ambulatory Visit
Admission: RE | Admit: 2018-10-07 | Discharge: 2018-10-07 | Disposition: A | Discharge status: Home / Self Care | Payer: Managed Care, Other (non HMO) | Source: Ambulatory Visit | Attending: Radiation Oncology | Admitting: Radiation Oncology

## 2018-10-07 DIAGNOSIS — Z510 Encounter for antineoplastic radiation therapy: Secondary | ICD-10-CM | POA: Diagnosis not present

## 2018-10-08 ENCOUNTER — Ambulatory Visit
Admission: RE | Admit: 2018-10-08 | Discharge: 2018-10-08 | Disposition: A | Discharge status: Home / Self Care | Payer: Managed Care, Other (non HMO) | Source: Ambulatory Visit | Attending: Radiation Oncology | Admitting: Radiation Oncology

## 2018-10-08 ENCOUNTER — Other Ambulatory Visit: Payer: Self-pay

## 2018-10-08 ENCOUNTER — Other Ambulatory Visit: Payer: Self-pay | Admitting: Medical Oncology

## 2018-10-08 ENCOUNTER — Encounter: Payer: Self-pay | Admitting: Medical Oncology

## 2018-10-08 ENCOUNTER — Encounter: Payer: Self-pay | Admitting: Internal Medicine

## 2018-10-08 DIAGNOSIS — Z510 Encounter for antineoplastic radiation therapy: Secondary | ICD-10-CM | POA: Diagnosis not present

## 2018-10-09 ENCOUNTER — Other Ambulatory Visit: Payer: Self-pay

## 2018-10-09 ENCOUNTER — Ambulatory Visit
Admission: RE | Admit: 2018-10-09 | Discharge: 2018-10-09 | Disposition: A | Discharge status: Home / Self Care | Payer: Managed Care, Other (non HMO) | Source: Ambulatory Visit | Attending: Radiation Oncology | Admitting: Radiation Oncology

## 2018-10-09 DIAGNOSIS — Z510 Encounter for antineoplastic radiation therapy: Secondary | ICD-10-CM | POA: Diagnosis not present

## 2018-10-10 ENCOUNTER — Ambulatory Visit
Admission: RE | Admit: 2018-10-10 | Discharge: 2018-10-10 | Disposition: A | Discharge status: Home / Self Care | Payer: Managed Care, Other (non HMO) | Source: Ambulatory Visit | Attending: Radiation Oncology | Admitting: Radiation Oncology

## 2018-10-10 ENCOUNTER — Other Ambulatory Visit: Payer: Self-pay

## 2018-10-10 DIAGNOSIS — C3411 Malignant neoplasm of upper lobe, right bronchus or lung: Secondary | ICD-10-CM | POA: Diagnosis not present

## 2018-10-10 DIAGNOSIS — J910 Malignant pleural effusion: Secondary | ICD-10-CM | POA: Diagnosis not present

## 2018-10-12 ENCOUNTER — Other Ambulatory Visit: Payer: Self-pay

## 2018-10-12 ENCOUNTER — Emergency Department (HOSPITAL_COMMUNITY): Payer: Managed Care, Other (non HMO)

## 2018-10-12 ENCOUNTER — Inpatient Hospital Stay (HOSPITAL_COMMUNITY)
Admission: EM | Admit: 2018-10-12 | Discharge: 2018-10-16 | DRG: 180 | Disposition: A | Discharge status: Home / Self Care | Payer: Managed Care, Other (non HMO) | Attending: Internal Medicine | Admitting: Internal Medicine

## 2018-10-12 ENCOUNTER — Encounter (HOSPITAL_COMMUNITY): Payer: Self-pay | Admitting: Emergency Medicine

## 2018-10-12 DIAGNOSIS — J910 Malignant pleural effusion: Secondary | ICD-10-CM

## 2018-10-12 DIAGNOSIS — I10 Essential (primary) hypertension: Secondary | ICD-10-CM | POA: Diagnosis present

## 2018-10-12 DIAGNOSIS — D630 Anemia in neoplastic disease: Secondary | ICD-10-CM | POA: Diagnosis present

## 2018-10-12 DIAGNOSIS — E43 Unspecified severe protein-calorie malnutrition: Secondary | ICD-10-CM | POA: Diagnosis present

## 2018-10-12 DIAGNOSIS — E871 Hypo-osmolality and hyponatremia: Secondary | ICD-10-CM

## 2018-10-12 DIAGNOSIS — D649 Anemia, unspecified: Secondary | ICD-10-CM

## 2018-10-12 DIAGNOSIS — C7951 Secondary malignant neoplasm of bone: Secondary | ICD-10-CM | POA: Diagnosis present

## 2018-10-12 DIAGNOSIS — Z66 Do not resuscitate: Secondary | ICD-10-CM | POA: Diagnosis present

## 2018-10-12 DIAGNOSIS — I4891 Unspecified atrial fibrillation: Secondary | ICD-10-CM | POA: Diagnosis present

## 2018-10-12 DIAGNOSIS — J948 Other specified pleural conditions: Secondary | ICD-10-CM | POA: Diagnosis present

## 2018-10-12 DIAGNOSIS — J9611 Chronic respiratory failure with hypoxia: Secondary | ICD-10-CM | POA: Diagnosis present

## 2018-10-12 DIAGNOSIS — Z85118 Personal history of other malignant neoplasm of bronchus and lung: Secondary | ICD-10-CM

## 2018-10-12 DIAGNOSIS — Z09 Encounter for follow-up examination after completed treatment for conditions other than malignant neoplasm: Secondary | ICD-10-CM

## 2018-10-12 DIAGNOSIS — Z923 Personal history of irradiation: Secondary | ICD-10-CM

## 2018-10-12 DIAGNOSIS — E222 Syndrome of inappropriate secretion of antidiuretic hormone: Secondary | ICD-10-CM | POA: Diagnosis present

## 2018-10-12 DIAGNOSIS — C3411 Malignant neoplasm of upper lobe, right bronchus or lung: Principal | ICD-10-CM | POA: Diagnosis present

## 2018-10-12 DIAGNOSIS — Z515 Encounter for palliative care: Secondary | ICD-10-CM | POA: Diagnosis present

## 2018-10-12 DIAGNOSIS — C3490 Malignant neoplasm of unspecified part of unspecified bronchus or lung: Secondary | ICD-10-CM | POA: Diagnosis present

## 2018-10-12 DIAGNOSIS — Z79899 Other long term (current) drug therapy: Secondary | ICD-10-CM

## 2018-10-12 DIAGNOSIS — C7931 Secondary malignant neoplasm of brain: Secondary | ICD-10-CM | POA: Diagnosis present

## 2018-10-12 DIAGNOSIS — M4854XA Collapsed vertebra, not elsewhere classified, thoracic region, initial encounter for fracture: Secondary | ICD-10-CM | POA: Diagnosis present

## 2018-10-12 DIAGNOSIS — Z86718 Personal history of other venous thrombosis and embolism: Secondary | ICD-10-CM

## 2018-10-12 DIAGNOSIS — R0602 Shortness of breath: Secondary | ICD-10-CM

## 2018-10-12 DIAGNOSIS — J9383 Other pneumothorax: Secondary | ICD-10-CM

## 2018-10-12 DIAGNOSIS — K5900 Constipation, unspecified: Secondary | ICD-10-CM | POA: Diagnosis present

## 2018-10-12 DIAGNOSIS — J90 Pleural effusion, not elsewhere classified: Secondary | ICD-10-CM

## 2018-10-12 DIAGNOSIS — C787 Secondary malignant neoplasm of liver and intrahepatic bile duct: Secondary | ICD-10-CM | POA: Diagnosis present

## 2018-10-12 DIAGNOSIS — J939 Pneumothorax, unspecified: Secondary | ICD-10-CM | POA: Diagnosis not present

## 2018-10-12 DIAGNOSIS — G893 Neoplasm related pain (acute) (chronic): Secondary | ICD-10-CM | POA: Diagnosis present

## 2018-10-12 DIAGNOSIS — F419 Anxiety disorder, unspecified: Secondary | ICD-10-CM | POA: Diagnosis present

## 2018-10-12 DIAGNOSIS — J9811 Atelectasis: Secondary | ICD-10-CM | POA: Diagnosis present

## 2018-10-12 DIAGNOSIS — Z79891 Long term (current) use of opiate analgesic: Secondary | ICD-10-CM

## 2018-10-12 DIAGNOSIS — Z87891 Personal history of nicotine dependence: Secondary | ICD-10-CM

## 2018-10-12 DIAGNOSIS — Z20828 Contact with and (suspected) exposure to other viral communicable diseases: Secondary | ICD-10-CM | POA: Diagnosis present

## 2018-10-12 DIAGNOSIS — Z7901 Long term (current) use of anticoagulants: Secondary | ICD-10-CM

## 2018-10-12 LAB — CBC WITH DIFFERENTIAL/PLATELET
Abs Immature Granulocytes: 0.08 10*3/uL — ABNORMAL HIGH (ref 0.00–0.07)
Basophils Absolute: 0.1 10*3/uL (ref 0.0–0.1)
Basophils Relative: 1 %
Eosinophils Absolute: 1.3 10*3/uL — ABNORMAL HIGH (ref 0.0–0.5)
Eosinophils Relative: 13 %
HCT: 35 % — ABNORMAL LOW (ref 36.0–46.0)
Hemoglobin: 11.2 g/dL — ABNORMAL LOW (ref 12.0–15.0)
Immature Granulocytes: 1 %
Lymphocytes Relative: 3 %
Lymphs Abs: 0.3 10*3/uL — ABNORMAL LOW (ref 0.7–4.0)
MCH: 25.9 pg — ABNORMAL LOW (ref 26.0–34.0)
MCHC: 32 g/dL (ref 30.0–36.0)
MCV: 81 fL (ref 80.0–100.0)
Monocytes Absolute: 0.7 10*3/uL (ref 0.1–1.0)
Monocytes Relative: 7 %
Neutro Abs: 7.6 10*3/uL (ref 1.7–7.7)
Neutrophils Relative %: 75 %
Platelets: 183 10*3/uL (ref 150–400)
RBC: 4.32 MIL/uL (ref 3.87–5.11)
RDW: 13.7 % (ref 11.5–15.5)
WBC: 10 10*3/uL (ref 4.0–10.5)
nRBC: 0 % (ref 0.0–0.2)

## 2018-10-12 LAB — COMPREHENSIVE METABOLIC PANEL
ALT: 18 U/L (ref 0–44)
AST: 22 U/L (ref 15–41)
Albumin: 2.8 g/dL — ABNORMAL LOW (ref 3.5–5.0)
Alkaline Phosphatase: 184 U/L — ABNORMAL HIGH (ref 38–126)
Anion gap: 14 (ref 5–15)
BUN: 7 mg/dL (ref 6–20)
CO2: 26 mmol/L (ref 22–32)
Calcium: 9 mg/dL (ref 8.9–10.3)
Chloride: 88 mmol/L — ABNORMAL LOW (ref 98–111)
Creatinine, Ser: 0.49 mg/dL (ref 0.44–1.00)
GFR calc Af Amer: >60 mL/min (ref >60)
GFR calc non Af Amer: >60 mL/min (ref >60)
Glucose, Bld: 97 mg/dL (ref 70–99)
Potassium: 3.8 mmol/L (ref 3.5–5.1)
Sodium: 128 mmol/L — ABNORMAL LOW (ref 135–145)
Total Bilirubin: 0.9 mg/dL (ref 0.3–1.2)
Total Protein: 7 g/dL (ref 6.5–8.1)

## 2018-10-12 LAB — TROPONIN I: Troponin I: <0.03 ng/mL (ref <0.03)

## 2018-10-12 NOTE — ED Provider Notes (Signed)
[facility] DEPT Provider Note   CSN: [id number] Arrival date & time: 10/12/18  2127    History   Chief Complaint Chief Complaint  Patient presents with  . Shortness of Breath    HPI Mariah Diaz is a 58 y.o. 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 Patient arrived via EMS. Patient reports she uses 3L of oxygen at home. Patient states shortness of breath is worse with movement and resting makes symptoms better. Patient states she has taking her Eliquis as prescribed. Patient reports chronic back pain. Patient denies numbness, tingling, weakness, incontinence to bowel/bladder, fever, chills, or IVDU. Patient denies chest pain, fever, nausea, vomiting, abdominal pain, diaphoresis, cough, congestion, sick contacts, or recent travel. Patient denies leg pain or leg edema.  Patient reports oncologist is Dr. Julien Nordmann and states she had radiation on Friday. Patient states she has not had chemotherapy. Patient reports she lives alone.    HPI  Past Medical History:  Diagnosis Date  . Depression   . Heart murmur    has not had in checked in "years"  . Hypertension   . No pertinent past medical history     Patient Active Problem List   Diagnosis Date Noted  . Hyponatremia 09/26/2018  . Pneumothorax 09/23/2018  . Pneumothorax, acute 09/22/2018  . Brain metastases ([place]) 09/10/2018  . Liver metastases ([place]) 09/10/2018  . Metastatic cancer to spine ([place]) 08/29/2018  . Bone metastasis ([place]) 08/23/2018  . Malignant neoplasm of unspecified part of unspecified bronchus or lung ([place]) 08/23/2018    Past Surgical History:  Procedure Laterality Date  . ANTERIOR CERVICAL CORPECTOMY N/A 08/29/2018   Procedure: Cervical 3 Corpectomy with Cervical 2 to Cervical 4 plate;  Surgeon: Erline Levine, MD;  Location: [facility];  Service:  Neurosurgery;  Laterality: N/A;  Cervical 3 Corpectomy with Cervical 2 to Cervical 4 plate  . KNEE ARTHROSCOPY WITH ANTERIOR CRUCIATE LIGAMENT (ACL) REPAIR  04/22/2012   Procedure: KNEE ARTHROSCOPY WITH ANTERIOR CRUCIATE LIGAMENT (ACL) REPAIR;  Surgeon: Hessie Dibble, MD;  Location: [facility];  Service: Orthopedics;  Laterality: Right;  . TONSILLECTOMY       OB History   No obstetric history on file.      Home Medications    Prior to Admission medications   Medication Sig Start Date End Date Taking? Authorizing Provider  Eliquis DVT/PE Starter Pack (ELIQUIS STARTER PACK) 5 MG TABS Take as directed on package: start with two-5mg  tablets twice daily for 7 days. On day 8, switch to one-5mg  tablet twice daily. 09/28/18   Pokhrel, Corrie Mckusick, MD  guaiFENesin-dextromethorphan (ROBITUSSIN DM) 100-10 MG/5ML syrup Take 5 mLs by mouth every 4 (four) hours as needed for cough (chest congestion). 09/28/18   Pokhrel, Corrie Mckusick, MD  LORazepam (ATIVAN) 0.5 MG tablet Take 1 tablet (0.5 mg total) by mouth every 8 (eight) hours. 09/29/18   Gery Pray, MD  meclizine (ANTIVERT) 12.5 MG tablet Take 2 tablets (25 mg total) by mouth 3 (three) times daily as needed for dizziness or nausea. 09/28/18   Pokhrel, Corrie Mckusick, MD  morphine (MS CONTIN) 30 MG 12 hr tablet Take 30 mg by mouth every 12 (twelve) hours.    [provider]  morphine (MSIR) 15 MG tablet Take 1 tablet (15 mg total) by mouth every 4 (four) hours as needed for severe pain. 10/01/18   Kinard,  Jeneen Rinks, MD  sodium chloride 1 g tablet Take 1 tablet (1 g total) by mouth 3 (three) times daily with meals. 09/28/18   Flora Lipps, MD    Family History History reviewed. No pertinent family history.  Social History Social History   Tobacco Use  . Smoking status: Former Smoker    Last attempt to quit: 08/15/2002    Years since quitting: 16.1  . Smokeless tobacco: Never Used  Substance Use Topics  . Alcohol use: Yes    Alcohol/week:  21.0 standard drinks    Types: 21 Glasses of wine per week    Comment: daily  . Drug use: No     Allergies   Patient has no known allergies.   Review of Systems Review of Systems  Constitutional: Negative for chills, diaphoresis, fatigue, fever and unexpected weight change.  HENT: Negative for congestion, rhinorrhea, sore throat and trouble swallowing.   Eyes: Negative for visual disturbance.  Respiratory: Positive for shortness of breath. Negative for cough, chest tightness, wheezing and stridor.   Cardiovascular: Negative for chest pain, palpitations and leg swelling.  Gastrointestinal: Negative for abdominal pain, blood in stool, diarrhea, nausea and vomiting.  Endocrine: Negative for cold intolerance and heat intolerance.  Musculoskeletal: Positive for back pain.  Skin: Negative for pallor and rash.  Allergic/Immunologic: Positive for immunocompromised state.  Neurological: Negative for dizziness, syncope, weakness, light-headedness and numbness.  Psychiatric/Behavioral: The patient is not nervous/anxious.      Physical Exam Updated Vital Signs BP (!) 124/94   Pulse (!) 102   Temp 98.8 F (37.1 C) (Oral)   Resp 14   Ht 5\' 6"  (1.676 m)   Wt 70.3 kg   SpO2 99%   BMI 25.02 kg/m   Physical Exam Vitals signs and nursing note reviewed.  Constitutional:      General: She is not in acute distress.    Appearance: She is well-developed. She is not diaphoretic.     Comments: Patient is sitting up in bed in no acute distress.   HENT:     Head: Normocephalic and atraumatic.  Neck:     Musculoskeletal: Normal range of motion.  Cardiovascular:     Rate and Rhythm: Regular rhythm. Tachycardia present.     Heart sounds: Normal heart sounds. No murmur. No friction rub. No gallop.   Pulmonary:     Effort: Pulmonary effort is normal. No respiratory distress.     Breath sounds: Examination of the right-upper field reveals decreased breath sounds. Examination of the right-middle  field reveals decreased breath sounds. Examination of the right-lower field reveals decreased breath sounds. Decreased breath sounds present. No wheezing, rhonchi or rales.     Comments: Patient is speaking in full sentences without difficulty on 3L of oxygen. Oxygen saturation is 99%.  Chest:     Chest wall: No tenderness.  Abdominal:     Palpations: Abdomen is soft.     Tenderness: There is no abdominal tenderness.  Musculoskeletal:     Cervical back: Normal. She exhibits normal range of motion, no tenderness and no bony tenderness.     Thoracic back: She exhibits normal range of motion, no tenderness and no bony tenderness.     Lumbar back: Normal. She exhibits normal range of motion, no tenderness and no bony tenderness.     Right lower leg: She exhibits no tenderness. No edema.     Left lower leg: She exhibits no tenderness. No edema.  Skin:    General: Skin is warm.  Findings: No erythema or rash.  Neurological:     Mental Status: She is alert.    ED Treatments / Results  Labs (all labs ordered are listed, but only abnormal results are displayed) Labs Reviewed  COMPREHENSIVE METABOLIC PANEL - Abnormal; Notable for the following components:      Result Value   Sodium 128 (*)    Chloride 88 (*)    Albumin 2.8 (*)    Alkaline Phosphatase 184 (*)    All other components within normal limits  CBC WITH DIFFERENTIAL/PLATELET - Abnormal; Notable for the following components:   Hemoglobin 11.2 (*)    HCT 35.0 (*)    MCH 25.9 (*)    Lymphs Abs 0.3 (*)    Eosinophils Absolute 1.3 (*)    Abs Immature Granulocytes 0.08 (*)    All other components within normal limits  SARS CORONAVIRUS 2 (HOSPITAL ORDER, [facility])  TROPONIN I    EKG EKG Interpretation  Date/Time:  Sunday October 12 2018 22:05:10 EDT Ventricular Rate:  101 PR Interval:    QRS Duration: 83 QT Interval:  345 QTC Calculation: 448 R Axis:   3 Text Interpretation:  Sinus  tachycardia Low voltage, precordial leads No significant change since last tracing Confirmed by Gareth Morgan [phone]) on 10/12/2018 11:23:08 PM   Radiology Dg Chest 2 View  Result Date: 10/12/2018 CLINICAL DATA:  58 year old female with increasing shortness of breath. Stage IV lung cancer. EXAM: CHEST - 2 VIEW COMPARISON:  09/26/2018 and earlier. FINDINGS: Right hydropneumothorax demonstrated previously, the air component appears resolved but with interval increasing fluid component which is at least partially loculated. Subsequent mildly decreased right lung ventilation since 09/26/2018. Stable cardiac size and mediastinal contours. Visualized tracheal air column is within normal limits. The left lung appears stable in clear. Negative visible bowel gas pattern. No acute osseous abnormality identified. IMPRESSION: 1. Increased right pleural effusion but resolved component of pneumothorax since 09/26/2018. 2. Overall right lung ventilation appears mildly decreased since the prior. 3. Left lung remains negative. Electronically Signed   By: Genevie Ann M.D.   On: 10/12/2018 22:55    Procedures Procedures (including critical care time)  Medications Ordered in ED Medications - No data to display   Initial Impression / Assessment and Plan / ED Course  I have reviewed the triage vital signs and the nursing notes.  Pertinent labs & imaging results that were available during my care of the patient were reviewed by me and considered in my medical decision making (see chart for details).  Clinical Course as of Oct 11 2345  Sun Oct 12, 2018  2258 CXR reveals increased right pleural effusion but resolved component of pneumothorax since 09/26/2018. Right lung ventilation appears mildly decreased compared to prior. Left lung is  negative. Images reviewed by me.    DG Chest 2 View [AH]  2308 WBCs are within normal limits.   WBC: 10.0 [AH]  2309 CMP reveals hyponatremia at 128 and low chloride at 88. This  appears to be baseline when compared to previous values.  Comprehensive metabolic panel(!) [AH]  4854 Troponin is negative.  Troponin I - Once [AH]    Clinical Course User Index [AH] Arville Lime, PA-C      Patient presents with shortness of breath. EKG reveals sinus tachycardia. First troponin is negative. Patient is afebrile and WBCs are within normal limits. CXR reveals increased right pleural effusion and mildly decreased right lung ventilation. Concern for a PE  due to history of lung cancer and DVT. COVID-19 test is pending. CTA is pending. Patient was recently admitted for similar symptoms on 04/10. Patient may need a thoracentesis due to larger pleural effusion. Recommend monitoring symptoms and reassessment prior to disposition.   At shift change care was transferred to Charlann Lange, PA-C who will follow pending studies, re-evaluate and determine disposition.    Findings and plan of care discussed with supervising physician Dr. Billy Fischer.  Final Clinical Impressions(s) / ED Diagnoses   Final diagnoses:  SOB (shortness of breath)    ED Discharge Orders    None       Arville Lime, PA-C 10/12/18 2347    Gareth Morgan, MD 10/13/18 0006

## 2018-10-12 NOTE — ED Notes (Signed)
Patient sitting upright in bed-in no acute distress at rest noted but states increased SOB with slight exertion-O2 sat 99% on O2 at 3 1/2 L/Oval-standing orders initiated.

## 2018-10-12 NOTE — ED Provider Notes (Signed)
H/o lung CA w/mets Radiation - no chemo 3L O2 at home SOB worsening this afternoon Pending COVID testing, CTA H/o UE DVT, on eliquis No fever Right pleural effusion, some worse Improved hydroPTX  Plan: if tests are negative, ambulate with O2, reassess  1:15 - Patient's pain is uncontrolled. IV MS 6 mg ordered. CT pending. COVID-19 negative.   2:15 - CT shows no PE. It is significant for large right pleural effusion causing compressive atx. Discussed findings with the patient. She is uncomfortable with discharge home secondary to worsening dyspnea. Feel she would benefit from IR intervention for repeat thoracentesis. Hospitalist paged for admission.   Discussed with Dr. Marlowe Sax who accepts the patient for admission.    Charlann Lange, PA-C 10/13/18 0241    Gareth Morgan, MD 10/16/18 [phone]

## 2018-10-12 NOTE — ED Notes (Addendum)
Blood drawn and sent to lab to hold

## 2018-10-12 NOTE — ED Triage Notes (Signed)
Pt presents from home for evaluation of increasing shortness of breath due to stage 4 lung cancer. Pt also reported back pain and was given 187mcg Fentanyl IVP during transport.

## 2018-10-12 NOTE — ED Notes (Signed)
Bed: TG25 Expected date:  Expected time:  Means of arrival:  Comments: EMS 58 yo female lung cancer-O2 dependent 3l/[place]-now on 6l/ BP 120/80-back pain from radiation-Fentanyl 100 mcg-patient complaining of increased SOB

## 2018-10-13 ENCOUNTER — Emergency Department (HOSPITAL_COMMUNITY): Payer: Managed Care, Other (non HMO)

## 2018-10-13 ENCOUNTER — Encounter (HOSPITAL_COMMUNITY): Payer: Self-pay

## 2018-10-13 ENCOUNTER — Encounter: Payer: Self-pay | Admitting: *Deleted

## 2018-10-13 ENCOUNTER — Telehealth: Payer: Self-pay | Admitting: Medical Oncology

## 2018-10-13 DIAGNOSIS — J948 Other specified pleural conditions: Secondary | ICD-10-CM | POA: Diagnosis present

## 2018-10-13 DIAGNOSIS — Z86718 Personal history of other venous thrombosis and embolism: Secondary | ICD-10-CM

## 2018-10-13 DIAGNOSIS — J910 Malignant pleural effusion: Secondary | ICD-10-CM | POA: Diagnosis present

## 2018-10-13 DIAGNOSIS — R590 Localized enlarged lymph nodes: Secondary | ICD-10-CM

## 2018-10-13 DIAGNOSIS — F910 Conduct disorder confined to family context: Secondary | ICD-10-CM

## 2018-10-13 DIAGNOSIS — C787 Secondary malignant neoplasm of liver and intrahepatic bile duct: Secondary | ICD-10-CM

## 2018-10-13 DIAGNOSIS — G893 Neoplasm related pain (acute) (chronic): Secondary | ICD-10-CM | POA: Diagnosis present

## 2018-10-13 DIAGNOSIS — C7931 Secondary malignant neoplasm of brain: Secondary | ICD-10-CM | POA: Diagnosis present

## 2018-10-13 DIAGNOSIS — D630 Anemia in neoplastic disease: Secondary | ICD-10-CM | POA: Diagnosis present

## 2018-10-13 DIAGNOSIS — E43 Unspecified severe protein-calorie malnutrition: Secondary | ICD-10-CM | POA: Diagnosis present

## 2018-10-13 DIAGNOSIS — C3490 Malignant neoplasm of unspecified part of unspecified bronchus or lung: Secondary | ICD-10-CM | POA: Diagnosis present

## 2018-10-13 DIAGNOSIS — Z20828 Contact with and (suspected) exposure to other viral communicable diseases: Secondary | ICD-10-CM | POA: Diagnosis present

## 2018-10-13 DIAGNOSIS — C7951 Secondary malignant neoplasm of bone: Secondary | ICD-10-CM

## 2018-10-13 DIAGNOSIS — Z66 Do not resuscitate: Secondary | ICD-10-CM | POA: Diagnosis present

## 2018-10-13 DIAGNOSIS — M4854XA Collapsed vertebra, not elsewhere classified, thoracic region, initial encounter for fracture: Secondary | ICD-10-CM | POA: Diagnosis present

## 2018-10-13 DIAGNOSIS — Z79891 Long term (current) use of opiate analgesic: Secondary | ICD-10-CM | POA: Diagnosis not present

## 2018-10-13 DIAGNOSIS — R634 Abnormal weight loss: Secondary | ICD-10-CM

## 2018-10-13 DIAGNOSIS — C3411 Malignant neoplasm of upper lobe, right bronchus or lung: Principal | ICD-10-CM

## 2018-10-13 DIAGNOSIS — E222 Syndrome of inappropriate secretion of antidiuretic hormone: Secondary | ICD-10-CM | POA: Diagnosis present

## 2018-10-13 DIAGNOSIS — J90 Pleural effusion, not elsewhere classified: Secondary | ICD-10-CM | POA: Diagnosis not present

## 2018-10-13 DIAGNOSIS — J9811 Atelectasis: Secondary | ICD-10-CM | POA: Diagnosis present

## 2018-10-13 DIAGNOSIS — E871 Hypo-osmolality and hyponatremia: Secondary | ICD-10-CM | POA: Diagnosis not present

## 2018-10-13 DIAGNOSIS — D649 Anemia, unspecified: Secondary | ICD-10-CM | POA: Diagnosis not present

## 2018-10-13 DIAGNOSIS — I4891 Unspecified atrial fibrillation: Secondary | ICD-10-CM | POA: Diagnosis present

## 2018-10-13 DIAGNOSIS — Z515 Encounter for palliative care: Secondary | ICD-10-CM | POA: Diagnosis present

## 2018-10-13 DIAGNOSIS — J939 Pneumothorax, unspecified: Secondary | ICD-10-CM | POA: Diagnosis not present

## 2018-10-13 DIAGNOSIS — R0602 Shortness of breath: Secondary | ICD-10-CM | POA: Diagnosis not present

## 2018-10-13 DIAGNOSIS — Z923 Personal history of irradiation: Secondary | ICD-10-CM

## 2018-10-13 DIAGNOSIS — J9611 Chronic respiratory failure with hypoxia: Secondary | ICD-10-CM | POA: Diagnosis present

## 2018-10-13 DIAGNOSIS — K5900 Constipation, unspecified: Secondary | ICD-10-CM | POA: Diagnosis present

## 2018-10-13 DIAGNOSIS — I10 Essential (primary) hypertension: Secondary | ICD-10-CM | POA: Diagnosis present

## 2018-10-13 DIAGNOSIS — Z79899 Other long term (current) drug therapy: Secondary | ICD-10-CM | POA: Diagnosis not present

## 2018-10-13 DIAGNOSIS — Z7189 Other specified counseling: Secondary | ICD-10-CM | POA: Diagnosis not present

## 2018-10-13 DIAGNOSIS — F419 Anxiety disorder, unspecified: Secondary | ICD-10-CM | POA: Diagnosis present

## 2018-10-13 LAB — CBC
HCT: 34.9 % — ABNORMAL LOW (ref 36.0–46.0)
Hemoglobin: 11.2 g/dL — ABNORMAL LOW (ref 12.0–15.0)
MCH: 26 pg (ref 26.0–34.0)
MCHC: 32.1 g/dL (ref 30.0–36.0)
MCV: 81 fL (ref 80.0–100.0)
Platelets: 182 10*3/uL (ref 150–400)
RBC: 4.31 MIL/uL (ref 3.87–5.11)
RDW: 13.7 % (ref 11.5–15.5)
WBC: 9 10*3/uL (ref 4.0–10.5)
nRBC: 0 % (ref 0.0–0.2)

## 2018-10-13 LAB — RETICULOCYTES
Immature Retic Fract: 13.7 % (ref 2.3–15.9)
RBC.: 4.31 MIL/uL (ref 3.87–5.11)
Retic Count, Absolute: 33.6 10*3/uL (ref 19.0–186.0)
Retic Ct Pct: 0.8 % (ref 0.4–3.1)

## 2018-10-13 LAB — IRON AND TIBC
Iron: 31 ug/dL (ref 28–170)
Saturation Ratios: 17 % (ref 10.4–31.8)
TIBC: 181 ug/dL — ABNORMAL LOW (ref 250–450)
UIBC: 150 ug/dL

## 2018-10-13 LAB — SARS CORONAVIRUS 2 BY RT PCR (HOSPITAL ORDER, PERFORMED IN ~~LOC~~ HOSPITAL LAB): SARS Coronavirus 2: NEGATIVE

## 2018-10-13 LAB — BASIC METABOLIC PANEL
Anion gap: 13 (ref 5–15)
BUN: 8 mg/dL (ref 6–20)
CO2: 27 mmol/L (ref 22–32)
Calcium: 9.1 mg/dL (ref 8.9–10.3)
Chloride: 88 mmol/L — ABNORMAL LOW (ref 98–111)
Creatinine, Ser: 0.47 mg/dL (ref 0.44–1.00)
GFR calc Af Amer: >60 mL/min (ref >60)
GFR calc non Af Amer: >60 mL/min (ref >60)
Glucose, Bld: 107 mg/dL — ABNORMAL HIGH (ref 70–99)
Potassium: 4.1 mmol/L (ref 3.5–5.1)
Sodium: 128 mmol/L — ABNORMAL LOW (ref 135–145)

## 2018-10-13 LAB — VITAMIN B12: Vitamin B-12: 4340 pg/mL — ABNORMAL HIGH (ref 180–914)

## 2018-10-13 LAB — FERRITIN: Ferritin: 618 ng/mL — ABNORMAL HIGH (ref 11–307)

## 2018-10-13 LAB — FOLATE: Folate: 6.2 ng/mL (ref >5.9)

## 2018-10-13 LAB — PROTIME-INR
INR: 3.4 — ABNORMAL HIGH (ref 0.8–1.2)
Prothrombin Time: 33.7 seconds — ABNORMAL HIGH (ref 11.4–15.2)

## 2018-10-13 MED ORDER — PHENOL 1.4 % MT LIQD
1.0000 | OROMUCOSAL | Status: DC | PRN
  Administered 2018-10-13 – 2018-10-14 (×2): 1 via OROMUCOSAL
  Filled 2018-10-13: qty 177

## 2018-10-13 MED ORDER — BENZONATATE 100 MG PO CAPS
200.0000 mg | ORAL_CAPSULE | Freq: Three times a day (TID) | ORAL | Status: DC | PRN
  Filled 2018-10-13: qty 2

## 2018-10-13 MED ORDER — SODIUM CHLORIDE 1 G PO TABS
1.0000 g | ORAL_TABLET | Freq: Three times a day (TID) | ORAL | Status: DC
  Administered 2018-10-15 (×3): 1 g via ORAL
  Filled 2018-10-13 (×6): qty 1

## 2018-10-13 MED ORDER — MECLIZINE HCL 25 MG PO TABS: 25.0000 mg | ORAL_TABLET | Freq: Three times a day (TID) | ORAL | Status: DC | PRN

## 2018-10-13 MED ORDER — ACETAMINOPHEN 325 MG PO TABS: 650.0000 mg | ORAL_TABLET | Freq: Four times a day (QID) | ORAL | Status: DC | PRN

## 2018-10-13 MED ORDER — HYDROCODONE-ACETAMINOPHEN 5-325 MG PO TABS
1.0000 | ORAL_TABLET | Freq: Four times a day (QID) | ORAL | Status: DC | PRN
  Administered 2018-10-13: 2 via ORAL
  Filled 2018-10-13: qty 2

## 2018-10-13 MED ORDER — APIXABAN 5 MG PO TABS: 5.0000 mg | ORAL_TABLET | Freq: Two times a day (BID) | ORAL | Status: DC

## 2018-10-13 MED ORDER — MORPHINE SULFATE (PF) 4 MG/ML IV SOLN
6.0000 mg | Freq: Once | INTRAVENOUS | Status: AC
  Administered 2018-10-13: 6 mg via INTRAVENOUS
  Filled 2018-10-13: qty 2

## 2018-10-13 MED ORDER — SODIUM CHLORIDE (PF) 0.9 % IJ SOLN
INTRAMUSCULAR | Status: AC
  Administered 2018-10-13: 04:00:00
  Filled 2018-10-13: qty 50

## 2018-10-13 MED ORDER — ACETAMINOPHEN 650 MG RE SUPP: 650.0000 mg | Freq: Four times a day (QID) | RECTAL | Status: DC | PRN

## 2018-10-13 MED ORDER — MORPHINE SULFATE (PF) 2 MG/ML IV SOLN
2.0000 mg | INTRAVENOUS | Status: DC | PRN
  Administered 2018-10-13 – 2018-10-15 (×12): 2 mg via INTRAVENOUS
  Filled 2018-10-13 (×12): qty 1

## 2018-10-13 MED ORDER — IOHEXOL 350 MG/ML SOLN
100.0000 mL | Freq: Once | INTRAVENOUS | Status: AC | PRN
  Administered 2018-10-13: 100 mL via INTRAVENOUS

## 2018-10-13 MED ORDER — LORAZEPAM 0.5 MG PO TABS: 0.5000 mg | ORAL_TABLET | Freq: Three times a day (TID) | ORAL | Status: DC | PRN

## 2018-10-13 NOTE — Progress Notes (Addendum)
HEMATOLOGY-ONCOLOGY PROGRESS NOTE  SUBJECTIVE: Mariah Diaz has been admitted for increasing shortness of breath and cough. Also has worsening pain to her back. Pain medication at home was making her have nausea and gagging. Denies fevers and chills. Denies chest pain and hemoptysis. Has gagging, but no vomiting. Has constipation. Denies bleeding. Appetite is decreased. Has lost about 20 lbs in 2 months. She completed WBRT and radiation to her painful bone mets on 10/10/2018.  REVIEW OF SYSTEMS:   Constitutional: Denies fevers, chills. Reports weight loss.  Eyes: Denies blurriness of vision Ears, nose, mouth, throat, and face: Denies mucositis or sore throat Respiratory: Reports cough and shortness of breath. No hemoptysis.  Cardiovascular: Denies palpitation, chest discomfort Gastrointestinal:  Denies heartburn. Reports nausea and constipation.  Skin: Denies abnormal skin rashes Lymphatics: Denies new lymphadenopathy or easy bruising MSK: Reports back pain.  Neurological:Denies numbness, tingling or new weaknesses Behavioral/Psych: Mood is stable, no new changes  Extremities: No lower extremity edema All other systems were reviewed with the patient and are negative.  I have reviewed the past medical history, past surgical history, social history and family history with the patient and they are unchanged from previous note.   PHYSICAL EXAMINATION:  Vitals:   10/13/18 0330 10/13/18 0400  BP: (!) 123/96 (!) 120/94  Pulse: (!) 101 (!) 101  Resp: 20 18  Temp:  98.2 F (36.8 C)  SpO2: 97% 100%   Filed Weights   10/12/18 2136 10/13/18 0430  Weight: 155 lb (70.3 kg) 154 lb 5.2 oz (70 kg)    GENERAL:alert, no distress and comfortable SKIN: skin color, texture, turgor are normal, no rashes or significant lesions EYES: normal, Conjunctiva are pink and non-injected, sclera clear OROPHARYNX:no exudate, no erythema and lips, buccal mucosa, and tongue normal  NECK: supple, thyroid normal  size, non-tender, without nodularity LYMPH:  no palpable lymphadenopathy in the cervical, axillary or inguinal LUNGS: Diminished BS to right mid lung down to base. Normal breathing effort HEART: regular rate & rhythm and no murmurs and no lower extremity edema ABDOMEN:abdomen soft, non-tender and normal bowel sounds Musculoskeletal:no cyanosis of digits and no clubbing  NEURO: alert & oriented x 3 with fluent speech, no focal motor/sensory deficits  LABORATORY DATA:  I have reviewed the data as listed CMP Latest Ref Rng & Units 10/13/2018 10/12/2018 09/28/2018  Glucose 70 - 99 mg/dL 107(H) 97 112(H)  BUN 6 - 20 mg/dL 8 7 8   Creatinine 0.44 - 1.00 mg/dL 0.47 0.49 0.61  Sodium 135 - 145 mmol/L 128(L) 128(L) 127(L)  Potassium 3.5 - 5.1 mmol/L 4.1 3.8 4.3  Chloride 98 - 111 mmol/L 88(L) 88(L) 89(L)  CO2 22 - 32 mmol/L 27 26 26   Calcium 8.9 - 10.3 mg/dL 9.1 9.0 9.0  Total Protein 6.5 - 8.1 g/dL - 7.0 -  Total Bilirubin 0.3 - 1.2 mg/dL - 0.9 -  Alkaline Phos 38 - 126 U/L - 184(H) -  AST 15 - 41 U/L - 22 -  ALT 0 - 44 U/L - 18 -    Lab Results  Component Value Date   WBC 9.0 10/13/2018   HGB 11.2 (L) 10/13/2018   HCT 34.9 (L) 10/13/2018   MCV 81.0 10/13/2018   PLT 182 10/13/2018   NEUTROABS 7.6 10/12/2018    ASSESSMENT AND PLAN: This is a very pleasant 58 year old white female recently diagnosed with stage IV non-small cell lung cancer presented with large right upper lobe lung mass with obstruction of the right upper lobe  bronchus in addition to mediastinal lymphadenopathy, multiple brain metastases, extensive liver metastasis as well as multiple bone metastasis diagnosed in March 2020. Completed radiation on 10/10/2018. Tissue was sent for molecular studies and did not show any actionable mutations.  I discussed the results of the molecular studies with the patient today. Treatment options discussed including a referral for palliative care/hopisce vs. treatment with concurrent  chemoradiation with chemotherapy consisting of Carboplatin, Alimta, and Keytruda. The patient has stated that she does not want to pursue treatment with concurrent chemoradiation. She expresses interest in talking with the palliative care team about goals of care and pain management. This referral has already been placed by the hospitalist.   For the recurrent pleural effusion, agree with IR evaluation for possible Pleurx catheter placement.   Mikey Bussing, DNP, AGPCNP-BC, AOCNP   ADDENDUM: Hematology/Oncology Attending: The patient is seen today.  I agree with the above note.  This is a very pleasant 58 years old white female diagnosed with stage IV non-small cell lung cancer, adenocarcinoma with extensive metastasis in the lung, right pleural effusion, liver as well as bone and brain. The patient underwent palliative radiotherapy to multiple areas including the brain, bone and mediastinal lymphadenopathy.  She is feeling a little bit better but continues to have shortness of breath at baseline increased with exertion.  The patient was admitted to the hospital recently and was found to have recurrent large pleural effusion. I recommended for the patient to have the Pleurx catheter placed for drainage of the pleural effusion. I had a lengthy discussion with the patient today about her current condition and treatment options.  The patient has no actionable mutations and PDL 1 expression is 20%. I discussed with the patient the option of palliative care and hospice referral versus consideration of palliative systemic chemotherapy with carboplatin, Alimta and Keytruda every 3 weeks.  I discussed with the patient with the prognosis with and without treatment. She is leaning more towards palliative care and hospice at this point.  If this is the final decision, she will need to contact her primary care physician Dr. Doyle Askew to be the attending for her hospice care. She was also advised to call the office  if she has any concerning. Thank you for taking good care of Ms. Outten.  Please call if you have any questions.  Eilleen Kempf, MD

## 2018-10-13 NOTE — Progress Notes (Signed)
PT Cancellation Note  Patient Details Name: Lilian Fuhs MRN: [id number] DOB: 09-Dec-1960   Cancelled Treatment:    Reason Eval/Treat Not Completed: Patient declined need for PT today. Up adlib to [facility]. Has RW at home. Patient requesting [place] aide,additional assistance at home as lives alone. Patient ay not need skilled PT intervention but will check back tomorrow.   Claretha Cooper 10/13/2018, 9:36 AM [place] Pager [phone] Office [phone]

## 2018-10-13 NOTE — H&P (Signed)
History and Physical    Edilia Ghuman BTD:[id number] DOB: [birth year] DOA: 10/12/2018  PCP: Orpah Melter, MD Patient coming from: Home  Chief Complaint: Shortness of breath  HPI: Mariah Diaz is a 58 y.o. female with medical history significant of stage IV metastatic non-small cell lung cancer with mets to liver, brain, and bone currently on palliative radiation, hypertension, DVT, right-sided hydropneumothorax, and conditions listed below presenting to the hospital via EMS for evaluation of shortness of breath. Recently admitted from September 26, 2018 to September 28, 2018.  She was diagnosed with left upper extremity DVT and discharged home on Eliquis.  History of right-sided hydropneumothorax.  Last thoracentesis on September 22, 2018.  Chest x-ray done during this hospitalization showed slight improvement in tissue expansion.  On 3 L oxygen via nasal cannula at home.  IR was consulted but recommended against chest tube placement.  Plan was to continue oxygen and proceed with radiation treatment.  Patient states she has continued to go to for radiation as planned.  For the past 1 to 2 days she has been feeling very short of breath even at rest.  Also having a dry cough.  Denies any fevers, chills, chest pain, sick contacts, recent travel, or exposure to any individual with suspected or confirmed COVID-19.  Reports having chronic mid to lower back pain secondary to cancer metastasis to her bones but states the pain has been worse recently and her home morphine is not helping.  No other complaints.  Review of Systems: As per HPI otherwise 10 point review of systems negative.  Past Medical History:  Diagnosis Date   Depression    Heart murmur    has not had in checked in "years"   Hypertension    No pertinent past medical history     Past Surgical History:  Procedure Laterality Date   ANTERIOR CERVICAL CORPECTOMY N/A 08/29/2018   Procedure: Cervical 3 Corpectomy with Cervical 2 to  Cervical 4 plate;  Surgeon: Erline Levine, MD;  Location: [place];  Service: Neurosurgery;  Laterality: N/A;  Cervical 3 Corpectomy with Cervical 2 to Cervical 4 plate   KNEE ARTHROSCOPY WITH ANTERIOR CRUCIATE LIGAMENT (ACL) REPAIR  04/22/2012   Procedure: KNEE ARTHROSCOPY WITH ANTERIOR CRUCIATE LIGAMENT (ACL) REPAIR;  Surgeon: Hessie Dibble, MD;  Location: [place];  Service: Orthopedics;  Laterality: Right;   TONSILLECTOMY       reports that she quit smoking about 16 years ago. She has never used smokeless tobacco. She reports current alcohol use of about 21.0 standard drinks of alcohol per week. She reports that she does not use drugs.  No Known Allergies  History reviewed. No pertinent family history.  Prior to Admission medications   Medication Sig Start Date End Date Taking? Authorizing Provider  Eliquis DVT/PE Starter Pack (ELIQUIS STARTER PACK) 5 MG TABS Take as directed on package: start with two-5mg  tablets twice daily for 7 days. On day 8, switch to one-5mg  tablet twice daily. 09/28/18   Pokhrel, Corrie Mckusick, MD  guaiFENesin-dextromethorphan (ROBITUSSIN DM) 100-10 MG/5ML syrup Take 5 mLs by mouth every 4 (four) hours as needed for cough (chest congestion). 09/28/18   Pokhrel, Corrie Mckusick, MD  LORazepam (ATIVAN) 0.5 MG tablet Take 1 tablet (0.5 mg total) by mouth every 8 (eight) hours. 09/29/18   Gery Pray, MD  meclizine (ANTIVERT) 12.5 MG tablet Take 2 tablets (25 mg total) by mouth 3 (three) times daily as needed for dizziness or nausea. 09/28/18   Pokhrel, Corrie Mckusick, MD  morphine (MS CONTIN) 30 MG 12 hr tablet Take 30 mg by mouth every 12 (twelve) hours.    [provider]  morphine (MSIR) 15 MG tablet Take 1 tablet (15 mg total) by mouth every 4 (four) hours as needed for severe pain. 10/01/18   Gery Pray, MD  sodium chloride 1 g tablet Take 1 tablet (1 g total) by mouth 3 (three) times daily with meals. 09/28/18   Flora Lipps, MD    Physical Exam: Vitals:     10/13/18 0200 10/13/18 0230 10/13/18 0300 10/13/18 0330  BP: 126/84 111/86 (!) 118/93 (!) 123/96  Pulse: 100 (!) 102 (!) 102 (!) 101  Resp: 18 18 18 20   Temp:      TempSrc:      SpO2: 96% 97% 98% 97%  Weight:      Height:        Physical Exam  Constitutional: She is oriented to person, place, and time. No distress.  HENT:  Head: Normocephalic.  Mouth/Throat: Oropharynx is clear and moist.  Eyes: Right eye exhibits no discharge. Left eye exhibits no discharge.  Neck: Neck supple.  Cardiovascular: Normal rate, regular rhythm and intact distal pulses.  Pulmonary/Chest: She has no wheezes. She has no rales.  On 3 L supplemental oxygen (same as home requirement) Speaking clearly in full sentences Left lung: Normal breath sounds Right lung: Adequate breath sounds appreciated at the apex, very diminished breath sounds at the midlung field and base  Abdominal: Soft. Bowel sounds are normal. She exhibits no distension. There is no abdominal tenderness. There is no guarding.  Musculoskeletal:        General: No edema.  Neurological: She is alert and oriented to person, place, and time.  Strength 5 out of 5 in bilateral upper and lower extremities. Sensation to light touch intact throughout.  Skin: Skin is warm and dry. She is not diaphoretic.     Labs on Admission: I have personally reviewed following labs and imaging studies  CBC: Recent Labs  Lab 10/12/18 2218  WBC 10.0  NEUTROABS 7.6  HGB 11.2*  HCT 35.0*  MCV 81.0  PLT 937   Basic Metabolic Panel: Recent Labs  Lab 10/12/18 2218  NA 128*  K 3.8  CL 88*  CO2 26  GLUCOSE 97  BUN 7  CREATININE 0.49  CALCIUM 9.0   GFR: Estimated Creatinine Clearance: 72.6 mL/min (by C-G formula based on SCr of 0.49 mg/dL). Liver Function Tests: Recent Labs  Lab 10/12/18 2218  AST 22  ALT 18  ALKPHOS 184*  BILITOT 0.9  PROT 7.0  ALBUMIN 2.8*   No results for input(s): LIPASE, AMYLASE in the last 168 hours. No results  for input(s): AMMONIA in the last 168 hours. Coagulation Profile: No results for input(s): INR, PROTIME in the last 168 hours. Cardiac Enzymes: Recent Labs  Lab 10/12/18 2218  TROPONINI <0.03   BNP (last 3 results) No results for input(s): PROBNP in the last 8760 hours. HbA1C: No results for input(s): HGBA1C in the last 72 hours. CBG: No results for input(s): GLUCAP in the last 168 hours. Lipid Profile: No results for input(s): CHOL, HDL, LDLCALC, TRIG, CHOLHDL, LDLDIRECT in the last 72 hours. Thyroid Function Tests: No results for input(s): TSH, T4TOTAL, FREET4, T3FREE, THYROIDAB in the last 72 hours. Anemia Panel: No results for input(s): VITAMINB12, FOLATE, FERRITIN, TIBC, IRON, RETICCTPCT in the last 72 hours. Urine analysis: No results found for: COLORURINE, APPEARANCEUR, [place], [place], [place], [place], [place], [place], [place], [place],  NITRITE, LEUKOCYTESUR  Radiological Exams on Admission: Dg Chest 2 View  Result Date: 10/12/2018 CLINICAL DATA:  58 year old female with increasing shortness of breath. Stage IV lung cancer. EXAM: CHEST - 2 VIEW COMPARISON:  09/26/2018 and earlier. FINDINGS: Right hydropneumothorax demonstrated previously, the air component appears resolved but with interval increasing fluid component which is at least partially loculated. Subsequent mildly decreased right lung ventilation since 09/26/2018. Stable cardiac size and mediastinal contours. Visualized tracheal air column is within normal limits. The left lung appears stable in clear. Negative visible bowel gas pattern. No acute osseous abnormality identified. IMPRESSION: 1. Increased right pleural effusion but resolved component of pneumothorax since 09/26/2018. 2. Overall right lung ventilation appears mildly decreased since the prior. 3. Left lung remains negative. Electronically Signed   By: Genevie Ann M.D.   On: 10/12/2018 22:55   Ct Angio Chest Pe W/cm &/or Wo Cm  Result Date:  10/13/2018 CLINICAL DATA:  Initial evaluation for acute dyspnea, angina. EXAM: CT ANGIOGRAPHY CHEST WITH CONTRAST TECHNIQUE: Multidetector CT imaging of the chest was performed using the standard protocol during bolus administration of intravenous contrast. Multiplanar CT image reconstructions and MIPs were obtained to evaluate the vascular anatomy. CONTRAST:  111mL OMNIPAQUE IOHEXOL 350 MG/ML SOLN COMPARISON:  Prior radiograph from 10/12/2018 as well as prior CT from 09/09/2018. FINDINGS: Cardiovascular: Intrathoracic aorta normal in caliber without aneurysm or other acute finding. Visualized great vessels intact and within normal limits. Heart and mediastinal structures mildly shifted to the left due to a large right pleural effusion. Heart size itself is within normal limits. Small pericardial effusion noted. Pulmonary arterial tree adequately opacified for evaluation. Main pulmonary artery within normal limits for caliber. Proximal segmental right pulmonary arteries attenuated by the right perihilar mass in large right pleural effusion. Evaluation of the distal left pulmonary arteries limited by motion artifact. No convincing filling defect to suggest acute pulmonary embolism. Re-formatted imaging confirms these findings. Mediastinum/Nodes: Visualized thyroid grossly normal. Mildly enlarged 11 mm prevascular node noted. Enlarged subcarinal nodal conglomerate measures up to 2 cm in short axis. No left-sided hilar adenopathy. Periaortic nodes measure up to 12 mm. No enlarged axillary nodes. Lungs/Pleura: Large masslike opacity again seen involving the right upper lobe with involvement of the adjacent right hilum, measuring approximately 7.1 x 5.7 cm, likely similar to previous. Exact measurements somewhat difficult due to adjacent atelectatic lung. Obstruction of the adjacent right upper lobe bronchus. Bronchus intermedius is narrowed but remains patent. Attenuation and narrowing of the right upper lobe segmental  pulmonary arteries again noted. Associated large right pleural effusion with associated atelectasis, increased in size from previous. Lilly a small portion of the right lung is pneumatized. Scattered pleural based densities at the posterior left lower lobe likely reflect atelectasis. No other focal infiltrates. No pneumothorax. Few scattered nodular densities noted at the left lung apex, largest of which measures 8 mm (series 10, image 21), indeterminate. Upper Abdomen: Multiple hepatic metastases again seen, not fully evaluated on this exam, but grossly similar to previous. Scattered pericaval nodes measure up to 11 mm, grossly similar. Remainder of the visualized upper abdomen demonstrates no other acute finding. Musculoskeletal: Extensive venous collateral seen throughout the upper left chest. Asymmetric skin thickening noted at the left breast (series 4, image 73), indeterminate. Diffuse lytic metastases seen throughout the visualized osseous structures, similar to previous. Associated pathologic fractures involving the T6 and T8 vertebral bodies are similar to previous. Mildly progressive pathologic fracture at the superior endplate of A54 as compared to previous.  There is an acute to subacute appearing fracture of the left lateral third rib, new from previous (series 4, image 24). No other definite acute osseous abnormality. Review of the MIP images confirms the above findings. IMPRESSION: 1. No CT evidence for acute pulmonary embolism. 2. Large central right upper lobe mass with involvement of the right hilum, most compatible with primary bronchogenic carcinoma, grossly similar to previous. 3. Associated large right pleural effusion with associated compressive atelectasis. This may be malignant in nature. 4. Diffuse hepatic metastases, similar to previous. 5. Scattered mediastinal and upper abdominal adenopathy as above, likely reflecting metastatic disease. 6. Diffuse lytic osseous metastases. Associated  pathologic compression fractures involving the T6 and T8 vertebral bodies are similar to previous. Associated height loss at the T10 vertebral body has progressed from previous. Acute to subacute fracture of the left lateral third rib also new from previous. 7. Increased skin thickening at the anterior left breast, indeterminate, and may be partially related to venous congestion given the multiple venous collaterals within the left chest wall. Correlation with physical exam and mammography suggested. 8. Few scattered subcentimeter pulmonary nodules measuring up to 8 mm at the left lung apex, indeterminate, but new from previous. Attention at follow-up recommended. Electronically Signed   By: Jeannine Boga M.D.   On: 10/13/2018 01:26    EKG: Independently reviewed.  Sinus tachycardia (heart rate 101).  Assessment/Plan Principal Problem:   Pleural effusion Active Problems:   Chronic hyponatremia   Metastatic lung cancer (metastasis from lung to other site) [facility])   Chronic anemia   History of DVT (deep vein thrombosis)   Recurrent pleural effusion, likely malignant in the setting of non-small cell lung cancer Currently stable on her home requirement of 3 L supplemental oxygen.  Afebrile and no significant leukocytosis.  Troponin negative and EKG not suggestive of ACS. SARS-CoV-2 test negative.  CT angiogram negative for PE.  Showing large central right upper lobe mass with involvement of the right hilum, most compatible with primary bronchogenic carcinoma, grossly similar to previous.  Associated large right pleural effusion with associated compressive atelectasis.  Patient's last thoracentesis was on April 6. -Discuss with IR in a.m.-therapeutic thoracentesis versus indwelling pleural catheter -Consult oncology in a.m. -Continue supplemental oxygen -Continuous pulse ox -Tessalon Perles PRN cough  Stage IV non-small cell lung cancer with mets to liver, brain, and bone Seen by Dr. Julien Nordmann  on an outpatient basis.  Patient complaining of worsening mid to lower back pain.  No neuro deficits.  CT with evidence of diffuse lytic osseous metastasis including compression fractures of T6 and T8 vertebral bodies similar to previous imaging and associated height loss at T10 vertebral body that has progressed from previous.  Acute to subacute fracture of the left lateral third rib also new from previous. Increased skin thickening at the anterior left breast, indeterminate, and may be partially related to venous congestion given the multiple venous collaterals within the left chest wall. -IV morphine 2 mg every 4 hours as needed -Norco 5-325 mg 1 to 2 tablets every 6 hours as needed -Consult oncology in a.m.  Chronic hyponatremia Sodium 128, at baseline.  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  Currently euvolemic on exam. -Continue salt tablets -Fluid restriction of 1500 cc/day -Continue to monitor BMP  Elevated alkaline phosphatase Alkaline phosphatase 184, remainder of LFTs normal.  Alkaline phosphatase elevation likely secondary to bone mets from metastatic cancer.  Chronic normocytic anemia Likely related to chronic disease/malignancy.  Hemoglobin 11.2, was 12.2-12.9  two weeks ago.  Patient denies any melena or hematochezia. -Continue to monitor CBC -Anemia panel  Recent left upper extremity DVT on Eliquis No left upper extremity pain or edema at this time. -Hold Eliquis at this time in anticipation of procedure as mentioned above  Hypertension -Currently normotensive.  Continue to monitor.  Physical deconditioning -PT evaluation  DVT prophylaxis: SCDs at this time. Code Status: Patient wishes to be DNR. Family Communication: No family available. Disposition Plan: Anticipate discharge after clinical improvement. Consults called: None Admission status: Observation, telemetry  This chart was dictated using voice recognition software.  Despite best efforts to  proofread, errors can occur which can change the documentation meaning.  Shela Leff MD [facility] Pager [phone]  If 7PM-7AM, please contact night-coverage [email or web address] Password [facility]  10/13/2018, 3:43 AM

## 2018-10-13 NOTE — Progress Notes (Signed)
Oncology Nurse Navigator Documentation  Oncology Nurse Navigator Flowsheets 10/13/2018  Navigator Location [facility]  Referral date to RadOnc/MedOnc -  Navigator Encounter Type Other/I spoke with patient today at [facility].  She was admitted due to resp issues and back pain.  She states she is doing better now.  She is still thinking about chemo + IO therapy vs Hospice.  I listened as she explained.  She will call and update [place] on what she would like to do.    Telephone -  Abnormal Finding Date 08/21/2018  Confirmed Diagnosis Date 08/29/2018  Surgery Date -  Treatment Initiated Date 08/29/2018  Patient Visit Type Inpatient  Treatment Phase Treatment  Barriers/Navigation Needs Education  Education Other  Interventions Education  Coordination of Care -  Education Method Verbal  Acuity Level 2  Time Spent with Patient 30

## 2018-10-13 NOTE — Progress Notes (Signed)
[facility]                                                                              Patient Demographics  Mariah Diaz, is a 58 y.o. 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  Patient was admitted from 4/10 to 4/12 and was diagnosed with left upper extremity DVT and was discharged home on Eliquis.  Last thoracentesis done on 09/22/2018, currently on 3 L O2 at home. Per patient she has continued to go to her radiation, last was on Friday past week.  Over the past 1 to 2 days, she has felt very short of breath even at rest with dry cough.  Otherwise no fever chills, chest pain, sick contacts, recent travel or exposure to any individuals with suspected or confirmed COVID-19.  Patient has also significant chronic mid to lower back pain secondary to metastasis to her bones.  She also reports that home pain management is not working.  Assessment & Plan    Principal Problem: Recurrent right pleural effusion, likely malignant in the setting of non-small cell lung cancer, metastatic -COVID-19 negative, no wheezing, currently on home 3 L O2, stable, afebrile. -CT angiogram negative for PE, showed large central right upper lobe mass with involvement of the right hilum, compatible with primary bronchogenic carcinoma, associated large right pleural effusion with compressive atelectasis. -IR consulted, given recurrent pleural effusion and malignant, will  benefit from Pleurx catheter.  Discussed with oncology, Dr. Julien Nordmann, who agrees and will evaluate patient as well -Discussed in detail with the patient regarding goals of care, current outpatient pain management is not working, no significant family support, palliative medicine consulted for Day and pain management   Active problems Metastatic stage IV non-small cell lung cancer with mets to liver, brain and bone -Follows Dr. Julien Nordmann, currently worsening back pain, no neuro deficits. -CT showed diffuse lytic osseous metastasis including compression fractures of T6 and T8 vertebral bodies similar to previous imaging, acute to subacute new fracture of left lateral third rib, receiving palliative XRT. -Palliative medicine consult for goals of care and pain management. 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  Elevated alk phos -Likely due to bony metastasis from stage IV lung CA  Chronic normocytic anemia likely due to chronic disease/malignancy -Anemia panel reviewed consistent with anemia of chronic disease.  Hemoglobin at baseline  Recent left upper extremity DVT -Hold Eliquis until IR evaluation  and Pleurx catheter placement  Essential hypertension -BP currently stable  Generalized debility PT evaluation pending   Code Status: DNR DVT Prophylaxis: Eliquis currently on hold Family Communication: Discussed in detail with the patient, all imaging results, lab results explained to the patient    Disposition Plan:   Time Spent in minutes 35 minutes  Procedures:  CT angiogram of the chest  Consultants:   Oncology, Dr. Julien Nordmann Interventional radiology  Antimicrobials:   Anti-infectives (From admission, onward)   None          Medications  Scheduled  Meds:  sodium chloride  1 g Oral TID WC   Continuous Infusions: PRN Meds:.acetaminophen **OR** acetaminophen, benzonatate, HYDROcodone-acetaminophen, LORazepam, meclizine, morphine injection      Subjective:   Mariah Diaz was seen and examined today.  At the time of examination, clearly appeared in pain, having difficulty speaking in full sentences due to pain and shortness of breath. Patient denies dizziness, abdominal pain, nausea vomiting or new weakness.  Afebrile Objective:   Vitals:   10/13/18 0300 10/13/18 0330 10/13/18 0400 10/13/18 0430  BP: (!) 118/93 (!) 123/96 (!) 120/94   Pulse: (!) 102 (!) 101 (!) 101   Resp: '18 20 18   '$ Temp:   98.2 F (36.8 C)   TempSrc:   Oral   SpO2: 98% 97% 100%   Weight:    70 kg  Height:    '5\' 6"'$  (1.676 m)    Intake/Output Summary (Last 24 hours) at 10/13/2018 1044 Last data filed at 10/13/2018 0430 Gross per 24 hour  Intake 0 ml  Output --  Net 0 ml     Wt Readings from Last 3 Encounters:  10/13/18 70 kg  09/26/18 67.7 kg  08/29/18 78.5 kg     Exam  General: Alert and oriented x 3, NAD  Eyes:   HEENT:  Atraumatic, normocephalic  Cardiovascular: S1 S2 auscultated,  Regular rate and rhythm.  Respiratory: Diminished breath sounds Rt>Lt  Gastrointestinal: Soft, nontender, nondistended, + bowel sounds  Ext: no pedal edema bilaterally  Neuro: No new deficits  Musculoskeletal: No digital cyanosis, clubbing  Skin: No rashes  Psych: Normal affect and demeanor, alert and oriented x3    Data Reviewed:  I have personally reviewed following labs and imaging studies  Micro Results Recent Results (from the past 240 hour(s))  SARS Coronavirus 2 [facility] order, Performed in [place] hospital lab)     Status: None   Collection Time: 10/12/18 10:45 PM  Result Value Ref Range Status   SARS Coronavirus 2 NEGATIVE NEGATIVE Final    Comment: (NOTE) If result is NEGATIVE SARS-CoV-2 target nucleic acids are NOT  DETECTED. The SARS-CoV-2 RNA is generally detectable in upper and lower  respiratory specimens during the acute phase of infection. The lowest  concentration of SARS-CoV-2 viral copies this assay can detect is 250  copies / mL. A negative result does not preclude SARS-CoV-2 infection  and should not be used as the sole basis for treatment or other  patient management decisions.  A negative result may occur with  improper specimen collection / handling, submission of specimen other  than nasopharyngeal swab, presence of viral mutation(s) within the  areas targeted by this assay, and inadequate number of viral copies  (<250 copies / mL). A negative result must be combined with clinical  observations, patient history, and epidemiological information. If result is POSITIVE SARS-CoV-2 target nucleic acids are DETECTED. The SARS-CoV-2 RNA is generally detectable in upper and lower  respiratory specimens dur ing the acute phase of infection.  Positive  results are indicative of active infection with SARS-CoV-2.  Clinical  correlation with patient history and other diagnostic information is  necessary to determine patient infection status.  Positive results do  not rule out bacterial infection or co-infection with other viruses. If result is PRESUMPTIVE POSTIVE SARS-CoV-2 nucleic acids MAY BE PRESENT.   A presumptive positive result was obtained on the submitted specimen  and confirmed on repeat testing.  While 2019 novel coronavirus  (SARS-CoV-2) nucleic acids may be present in the submitted sample  additional confirmatory testing may be necessary for epidemiological  and / or clinical management purposes  to differentiate between  SARS-CoV-2 and other Sarbecovirus currently known to infect humans.  If clinically indicated additional testing with an alternate test  methodology [phone]) is advised. The SARS-CoV-2 RNA is generally  detectable in upper and lower respiratory sp ecimens during  the acute  phase of infection. The expected result is Negative. Fact Sheet for Patients:  StrictlyIdeas.no Fact Sheet for Healthcare Providers: BankingDealers.co.za This test is not yet approved or cleared by the [place] FDA and has been authorized for detection and/or diagnosis of SARS-CoV-2 by FDA under an Emergency Use Authorization (EUA).  This EUA will remain in effect (meaning this test can be used) for the duration of the COVID-19 declaration under Section 564(b)(1) of the Act, 21 U.S.C. section 360bbb-3(b)(1), unless the authorization is terminated or revoked sooner. Performed at [facility], [place] [address], [place], [place] [address]     Radiology Reports Dg Chest 1 View  Result Date: 09/25/2018 CLINICAL DATA:  Aspiration into airway EXAM: CHEST  1 VIEW COMPARISON:  Two days ago FINDINGS: Large but diminished right pneumothorax. Moderate pleural effusion on the right with pulmonary opacification. There is pulmonary obstruction by CT. Comparatively clear left chest. Borderline heart size. IMPRESSION: Large right hydropneumothorax with decreasing gas component since 2 days ago. Electronically Signed   By: Monte Fantasia M.D.   On: 09/25/2018 07:48   Dg Chest 1 View  Result Date: 09/22/2018 CLINICAL DATA:  Stage for lung cancer diagnosed 1 month ago. Shortness of breath. EXAM: CHEST  1 VIEW COMPARISON:  Earlier same day FINDINGS: Heart size is normal. Left chest remains clear. There has been previous right thoracentesis with removal of the left pleural fluid. There is a right pneumothorax estimated at 50-60%. No tension is evident. IMPRESSION: 50-60% right pneumothorax following thoracentesis. No visible residual pleural fluid. No evidence of tension. These results will be called to the ordering clinician or representative by the Radiologist Assistant, and communication documented in the PACS or zVision Dashboard.  Electronically Signed   By: Nelson Chimes M.D.   On: 09/22/2018 15:34   Dg Chest 2 View  Result Date: 10/12/2018 CLINICAL DATA:  58 year old female with increasing shortness of breath. Stage IV lung cancer. EXAM: CHEST - 2 VIEW COMPARISON:  09/26/2018 and earlier. FINDINGS: Right hydropneumothorax demonstrated previously, the air component appears resolved but with interval increasing fluid component which is at least partially loculated. Subsequent mildly decreased right lung ventilation since 09/26/2018. Stable cardiac size and mediastinal contours. Visualized tracheal air column is within normal limits. The left lung appears stable in clear. Negative visible bowel gas pattern. No acute osseous abnormality identified. IMPRESSION: 1. Increased right pleural effusion but resolved component of pneumothorax since 09/26/2018. 2. Overall right lung ventilation appears mildly decreased since the prior. 3. Left lung remains negative. Electronically Signed   By:  Genevie Ann M.D.   On: 10/12/2018 22:55   Ct Angio Chest Pe W/cm &/or Wo Cm  Result Date: 10/13/2018 CLINICAL DATA:  Initial evaluation for acute dyspnea, angina. EXAM: CT ANGIOGRAPHY CHEST WITH CONTRAST TECHNIQUE: Multidetector CT imaging of the chest was performed using the standard protocol during bolus administration of intravenous contrast. Multiplanar CT image reconstructions and MIPs were obtained to evaluate the vascular anatomy. CONTRAST:  141m OMNIPAQUE IOHEXOL 350 MG/ML SOLN COMPARISON:  Prior radiograph from 10/12/2018 as well as prior CT from 09/09/2018. FINDINGS: Cardiovascular: Intrathoracic aorta normal in caliber without aneurysm or other acute finding. Visualized great vessels intact and within normal limits. Heart and mediastinal structures mildly shifted to the left due to a large right pleural effusion. Heart size itself is within normal limits. Small pericardial effusion noted. Pulmonary arterial tree adequately opacified for evaluation.  Main pulmonary artery within normal limits for caliber. Proximal segmental right pulmonary arteries attenuated by the right perihilar mass in large right pleural effusion. Evaluation of the distal left pulmonary arteries limited by motion artifact. No convincing filling defect to suggest acute pulmonary embolism. Re-formatted imaging confirms these findings. Mediastinum/Nodes: Visualized thyroid grossly normal. Mildly enlarged 11 mm prevascular node noted. Enlarged subcarinal nodal conglomerate measures up to 2 cm in short axis. No left-sided hilar adenopathy. Periaortic nodes measure up to 12 mm. No enlarged axillary nodes. Lungs/Pleura: Large masslike opacity again seen involving the right upper lobe with involvement of the adjacent right hilum, measuring approximately 7.1 x 5.7 cm, likely similar to previous. Exact measurements somewhat difficult due to adjacent atelectatic lung. Obstruction of the adjacent right upper lobe bronchus. Bronchus intermedius is narrowed but remains patent. Attenuation and narrowing of the right upper lobe segmental pulmonary arteries again noted. Associated large right pleural effusion with associated atelectasis, increased in size from previous. Lilly a small portion of the right lung is pneumatized. Scattered pleural based densities at the posterior left lower lobe likely reflect atelectasis. No other focal infiltrates. No pneumothorax. Few scattered nodular densities noted at the left lung apex, largest of which measures 8 mm (series 10, image 21), indeterminate. Upper Abdomen: Multiple hepatic metastases again seen, not fully evaluated on this exam, but grossly similar to previous. Scattered pericaval nodes measure up to 11 mm, grossly similar. Remainder of the visualized upper abdomen demonstrates no other acute finding. Musculoskeletal: Extensive venous collateral seen throughout the upper left chest. Asymmetric skin thickening noted at the left breast (series 4, image 73),  indeterminate. Diffuse lytic metastases seen throughout the visualized osseous structures, similar to previous. Associated pathologic fractures involving the T6 and T8 vertebral bodies are similar to previous. Mildly progressive pathologic fracture at the superior endplate of TJ88as compared to previous. There is an acute to subacute appearing fracture of the left lateral third rib, new from previous (series 4, image 24). No other definite acute osseous abnormality. Review of the MIP images confirms the above findings. IMPRESSION: 1. No CT evidence for acute pulmonary embolism. 2. Large central right upper lobe mass with involvement of the right hilum, most compatible with primary bronchogenic carcinoma, grossly similar to previous. 3. Associated large right pleural effusion with associated compressive atelectasis. This may be malignant in nature. 4. Diffuse hepatic metastases, similar to previous. 5. Scattered mediastinal and upper abdominal adenopathy as above, likely reflecting metastatic disease. 6. Diffuse lytic osseous metastases. Associated pathologic compression fractures involving the T6 and T8 vertebral bodies are similar to previous. Associated height loss at the T10 vertebral body has progressed  from previous. Acute to subacute fracture of the left lateral third rib also new from previous. 7. Increased skin thickening at the anterior left breast, indeterminate, and may be partially related to venous congestion given the multiple venous collaterals within the left chest wall. Correlation with physical exam and mammography suggested. 8. Few scattered subcentimeter pulmonary nodules measuring up to 8 mm at the left lung apex, indeterminate, but new from previous. Attention at follow-up recommended. Electronically Signed   By: Jeannine Boga M.D.   On: 10/13/2018 01:26   Mr Jeri Cos WU Contrast  Result Date: 09/15/2018 CLINICAL DATA:  SRS targeting. Pathology from the C3 vertebral body yielded  metastatic non-small cell carcinoma, consistent with origin from lung. EXAM: MRI HEAD WITHOUT AND WITH CONTRAST TECHNIQUE: Multiplanar, multiecho pulse sequences of the brain and surrounding structures were obtained without and with intravenous contrast. CONTRAST:  19m MULTIHANCE GADOBENATE DIMEGLUMINE 529 MG/ML IV SOLN COMPARISON:  09/05/2018. FINDINGS: Brain: Multiple metastatic deposits are redemonstrated. These are listed from inferior to superior as follows as seen on series 11 axial postcontrast T1 weighted imaging: Image 29, RIGHT cerebellum, 2 mm. Image 41, RIGHT lower pons, 5 mm. Image 53, RIGHT mid pons, 4 mm. Image 56, LEFT paramedian vermis, 1 mm. Image 58, LEFT lateral temporal lobe, 5 mm. Image 59, LEFT medial temporal lobe, 7 x 9 mm. Image 79, RIGHT posterior temporal lobe, 2 mm. Image 89, medial RIGHT frontal lobe, 2 mm. Image 92, RIGHT subinsular white matter, 4 mm. Image 117, LEFT posterior frontal lobe, 4 mm. Image 122, LEFT medial anterior parietal lobe, 2 mm. Image 132, LEFT posterior frontal lobe, 5 mm. Image 132, RIGHT lateral frontal lobe, 4 mm. Image 138, RIGHT superior frontal parasagittal cortex, 5 mm. There may be other lesions which are not detected due to difficulty in distinguishing small lesions from cortical vessels. Redemonstrated are scattered areas of vasogenic edema, affecting not only the cerebral hemispheres but the brainstem. Vascular: Normal flow voids. Skull and upper cervical spine: Normal marrow signal. Cervical spine poorly visualized. Sinuses/Orbits: No significant orbital or paranasal sinus findings. Other: None. IMPRESSION: At least 14 intracranial metastatic deposits are identified, although due to the widespread nature, and small size of some lesions, it is possible that other metastases are undetected. See discussion above. Electronically Signed   By: JStaci RighterM.D.   On: 09/15/2018 14:02   Dg Chest Port 1 View  Addendum Date: 09/26/2018   ADDENDUM REPORT:  09/26/2018 14:08 ADDENDUM: The chest x-ray was reviewed at the request of Dr. YDarl Householderfor potential need for a chest tube. When compared to the 04/9 study, there is further interval expansion of the right lower lobe and compared to the 4/7 and 4/6 chest x-rays, there is considerably improved aeration of the right lower lung. The right upper lobe is known to be densely involved with tumor and would not be expected to re-expand. There is some expected reaccumulation of right pleural fluid since thoracentesis of moderate volume. Electronically Signed   By: GAletta EdouardM.D.   On: 09/26/2018 14:08   Result Date: 09/26/2018 CLINICAL DATA:  Shortness of breath, cough, nausea and vomiting. EXAM: PORTABLE CHEST 1 VIEW COMPARISON:  Single-view of the chest 09/25/2018, 09/23/2018 and 09/22/2018. FINDINGS: Right hydropneumothorax seen on the prior exams persists and appears unchanged since the most recent study. The left lung is clear and well expanded. No left effusion. Heart size is normal. IMPRESSION: No change in a large right hydropneumothorax since the most recent exam. No  new abnormality. Electronically Signed: By: Inge Rise M.D. On: 09/26/2018 13:26   Portable Chest 1 View  Result Date: 09/23/2018 CLINICAL DATA:  Right pneumothorax. EXAM: PORTABLE CHEST 1 VIEW COMPARISON:  09/22/2018 FINDINGS: There is a persistent large right pneumothorax with further collapse of the right lower lobe. The right middle and upper lobes appear completely collapsed. There is a recurrent small right effusion. Left lung is clear. Heart size and pulmonary vascularity are normal. No evidence of tension pneumothorax. IMPRESSION: Persistent large right pneumothorax with further collapse of the right lower lobe and with complete collapse of the right middle and upper lobes. The previous CT scan demonstrated a mass obstructing the upper lobe bronchus. Electronically Signed   By: Lorriane Shire M.D.   On: 09/23/2018 08:01   Dg Chest  Port 1 View  Result Date: 09/22/2018 CLINICAL DATA:  Followup right pneumothorax. EXAM: PORTABLE CHEST 1 VIEW COMPARISON:  Earlier same day FINDINGS: Persistent right pneumothorax with complete collapse of the right lung. Pneumothorax is probably enlarging slightly, now estimated at 60-70%. No definite evidence of tension. IMPRESSION: Probable slight worsening of the right pneumothorax, estimated at 60-70%. Electronically Signed   By: Nelson Chimes M.D.   On: 09/22/2018 17:13   Dg Chest Port 1 View  Result Date: 09/22/2018 CLINICAL DATA:  Shortness of breath 2 days. Recently diagnosed with right-sided stage IV lung cancer. EXAM: PORTABLE CHEST 1 VIEW COMPARISON:  Chest CT 09/09/2018 FINDINGS: Exam demonstrates moderate opacification over the right lung compatible with moderate size effusion tracking to the apex. Findings are similar to patient's recent CT scan which demonstrated a patient's known large right upper lobe lung cancer with large right effusion. Borders of the known right upper lobe mass or not well visualized. Left lung is clear. Cardiomediastinal silhouette is within normal. Mild mottled sclerosis over the left scapula in the region of the glenoid suggesting metastatic disease. IMPRESSION: Moderate opacification over the right lung compatible with moderate size effusion likely with basilar atelectasis without significant change from recent CT. Patient's known medial right upper lobe lung cancer is not well visualized. Mottled sclerosis over the left scapula/glenoid suggesting metastatic disease. Electronically Signed   By: Marin Olp M.D.   On: 09/22/2018 12:19   Vas [place] Upper Extremity Venous Duplex  Result Date: 09/29/2018 UPPER VENOUS STUDY  Indications: Pain, Swelling, and Recent IV LUE Performing Technologist: Maudry Mayhew MHA, RDMS, RVT, RDCS  Examination Guidelines: A complete evaluation includes B-mode imaging, spectral Doppler, color Doppler, and power Doppler as needed of all  accessible portions of each vessel. Bilateral testing is considered an integral part of a complete examination. Limited examinations for reoccurring indications may be performed as noted.  Right Findings: +----------+------------+---------+-----------+----------+-------+  RIGHT      Compressible Phasicity Spontaneous Properties Summary  +----------+------------+---------+-----------+----------+-------+  Subclavian                 Yes        Yes                         +----------+------------+---------+-----------+----------+-------+  Left Findings: +----------+------------+---------+-----------+----------+--------------+  LEFT       Compressible Phasicity Spontaneous Properties    Summary      +----------+------------+---------+-----------+----------+--------------+  IJV            None                   No  Acute       +----------+------------+---------+-----------+----------+--------------+  Subclavian     None                   No                     Acute       +----------+------------+---------+-----------+----------+--------------+  Axillary       None                   No                     Acute       +----------+------------+---------+-----------+----------+--------------+  Brachial       None                   No                     Acute       +----------+------------+---------+-----------+----------+--------------+  Radial         Full                                                      +----------+------------+---------+-----------+----------+--------------+  Ulnar          Full                                                      +----------+------------+---------+-----------+----------+--------------+  Cephalic       Full                                                      +----------+------------+---------+-----------+----------+--------------+  Basilic                                                  Not visualized   +----------+------------+---------+-----------+----------+--------------+  Summary:  Right: No evidence of thrombosis in the subclavian.  Left: Findings consistent with acute deep vein thrombosis involving the left internal jugular veins, left subclavian veins, left axillary vein and left brachial veins.  *See table(s) above for measurements and observations.  Diagnosing physician: Monica Martinez MD Electronically signed by Monica Martinez MD on 09/29/2018 at 5:13:00 PM.    Final    Dg Esophagus W Single Cm (sol Or Thin Ba)  Result Date: 09/24/2018 CLINICAL DATA:  Dysphagia, with feeling of food getting stuck in upper esophagus. Recently diagnosed with metastatic lung cancer. EXAM: ESOPHOGRAM/BARIUM SWALLOW TECHNIQUE: Single contrast examination was performed using  thin barium. FLUOROSCOPY TIME:  Fluoroscopy Time:  2 minutes and 48 seconds Radiation Exposure Index (if provided by the fluoroscopic device): 50.1 mGy Number of Acquired Spot Images: 0 COMPARISON:  CT of 09/09/2018. FINDINGS: Exam was performed using a single-contrast technique, with the patient in an RAO position secondary to relative immobility and discomfort. Evaluation of esophageal peristalsis demonstrates an incomplete peristaltic wave  with contrast stasis in the upper esophagus. Example on series 1 and 2. There are tertiary contractions with eventual passage of contrast in the lower esophagus. Full column evaluation of the esophagus demonstrates an approximately 3 cm segment of relatively mild esophageal underdistention, suspicious for mass-effect from thoracic adenopathy. This is identified at and just inferior to the carina, including on image 197/4. Also image 95/3. IMPRESSION: 1. 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 2. Mild limitations, as detailed above. 3. Mid esophageal underdistention, likely due to relatively mild mass effect from mediastinal adenopathy. Electronically Signed   By: Abigail Miyamoto  M.D.   On: 09/24/2018 15:14   US Thoracentesis Asp Pleural Space W/img Guide  Result Date: 09/22/2018 INDICATION: Patient with history of stage IV lung cancer, dyspnea, large right pleural effusion; request received for therapeutic right thoracentesis. EXAM: ULTRASOUND GUIDED THERAPEUTIC RIGHT THORACENTESIS MEDICATIONS: None COMPLICATIONS: None immediate. PROCEDURE: An ultrasound guided thoracentesis was thoroughly discussed with the patient and questions answered. The benefits, risks, alternatives and complications were also discussed. The patient understands and wishes to proceed with the procedure. Written consent was obtained. Ultrasound was performed to localize and mark an adequate pocket of fluid in the right chest. The area was then prepped and draped in the normal sterile fashion. 1% Lidocaine was used for local anesthesia. Under ultrasound guidance a 6 Fr Safe-T-Centesis catheter was introduced. Thoracentesis was performed. The catheter was removed and a dressing applied. FINDINGS: A total of approximately 1.9 liters of yellow fluid was removed. IMPRESSION: Successful ultrasound guided therapeutic right thoracentesis yielding 1.9 liters of pleural fluid. Follow-up chest x-ray revealed approximately 50-60% pneumothorax, most likely ex vacuo in origin with failed reexpansion of the lung due to tumor. Above findings discussed with Drs. Yamagata/Mohamed and patient's nurse. Patient currently stable. If symptoms worsen can obtain additional follow-up chest x-ray. Read by: Rowe Robert, PA-C Electronically Signed   By: Aletta Edouard M.D.   On: 09/22/2018 16:00    Lab Data:  CBC: Recent Labs  Lab 10/12/18 2218 10/13/18 0449  WBC 10.0 9.0  NEUTROABS 7.6  --   HGB 11.2* 11.2*  HCT 35.0* 34.9*  MCV 81.0 81.0  PLT 183 297   Basic Metabolic Panel: Recent Labs  Lab 10/12/18 2218 10/13/18 0449  NA 128* 128*  K 3.8 4.1  CL 88* 88*  CO2 26 27  GLUCOSE 97 107*  BUN 7 8  CREATININE 0.49 0.47   CALCIUM 9.0 9.1   GFR: Estimated Creatinine Clearance: 72.6 mL/min (by C-G formula based on SCr of 0.47 mg/dL). Liver Function Tests: Recent Labs  Lab 10/12/18 2218  AST 22  ALT 18  ALKPHOS 184*  BILITOT 0.9  PROT 7.0  ALBUMIN 2.8*   No results for input(s): LIPASE, AMYLASE in the last 168 hours. No results for input(s): AMMONIA in the last 168 hours. Coagulation Profile: No results for input(s): INR, PROTIME in the last 168 hours. Cardiac Enzymes: Recent Labs  Lab 10/12/18 2218  TROPONINI <0.03   BNP (last 3 results) No results for input(s): PROBNP in the last 8760 hours. HbA1C: No results for input(s): HGBA1C in the last 72 hours. CBG: No results for input(s): GLUCAP in the last 168 hours. Lipid Profile: No results for input(s): CHOL, HDL, LDLCALC, TRIG, CHOLHDL, LDLDIRECT in the last 72 hours. Thyroid Function Tests: No results for input(s): TSH, T4TOTAL, FREET4, T3FREE, THYROIDAB in the last 72 hours. Anemia Panel: Recent Labs    10/13/18 0449  VITAMINB12 4,340*  FOLATE 6.2  FERRITIN 618*  TIBC 181*  IRON 31  RETICCTPCT 0.8   Urine analysis: No results found for: COLORURINE, APPEARANCEUR, LABSPEC, PHURINE, GLUCOSEU, HGBUR, BILIRUBINUR, KETONESUR, PROTEINUR, UROBILINOGEN, NITRITE, LEUKOCYTESUR   Cayley Pester M.D. [facility] 10/13/2018, 10:44 AM  Pager: [phone] Between 7am to 7pm - call Pager - [phone]  After 7pm go to [email or web address] - password [email or web address]  Call night coverage person covering after 7pm

## 2018-10-13 NOTE — ED Notes (Signed)
ED TO INPATIENT HANDOFF REPORT  ED Nurse Name and Phone #: Christell Constant [phone]  S Name/Age/Gender Mariah Diaz 58 y.o. female Room/Bed: WA13/WA13  Code Status   Code Status: Prior  Home/SNF/Other Home Patient oriented to: self Is this baseline? Yes   Triage Complete: Triage complete  Chief Complaint SOB/Oncology Patient  Triage Note Pt presents from home for evaluation of increasing shortness of breath due to stage 4 lung cancer. Pt also reported back pain and was given 172mcg Fentanyl IVP during transport.    Allergies No Known Allergies  Level of Care/Admitting Diagnosis ED Disposition    ED Disposition Condition Comment   Admit  Hospital Area: [facility] [[id number]]  Level of Care: Telemetry [5]  Admit to tele based on following criteria: Other see comments  Comments: Monitor hemodynamics  Covid Evaluation: N/A  Diagnosis: Pleural effusion [242230]  Admitting Physician: Shela Leff [[id number]]  Attending Physician: Shela Leff [[id number]]  PT Class (Do Not Modify): Observation [104]  PT Acc Code (Do Not Modify): Observation [10022]       B Medical/Surgery History Past Medical History:  Diagnosis Date  . Depression   . Heart murmur    has not had in checked in "years"  . Hypertension   . No pertinent past medical history    Past Surgical History:  Procedure Laterality Date  . ANTERIOR CERVICAL CORPECTOMY N/A 08/29/2018   Procedure: Cervical 3 Corpectomy with Cervical 2 to Cervical 4 plate;  Surgeon: Erline Levine, MD;  Location: [facility];  Service: Neurosurgery;  Laterality: N/A;  Cervical 3 Corpectomy with Cervical 2 to Cervical 4 plate  . KNEE ARTHROSCOPY WITH ANTERIOR CRUCIATE LIGAMENT (ACL) REPAIR  04/22/2012   Procedure: KNEE ARTHROSCOPY WITH ANTERIOR CRUCIATE LIGAMENT (ACL) REPAIR;  Surgeon: Hessie Dibble, MD;  Location: [facility];  Service: Orthopedics;  Laterality: Right;  . TONSILLECTOMY        A IV Location/Drains/Wounds Patient Lines/Drains/Airways Status   Active Line/Drains/Airways    Name:   Placement date:   Placement time:   Site:   Days:   Peripheral IV 10/12/18 Left Antecubital   10/12/18    2124    Antecubital   1   Incision 04/22/12 Knee Right   04/22/12    1205     2365   Incision (Closed) 08/29/18 Neck   08/29/18    1839     45          Intake/Output Last 24 hours No intake or output data in the 24 hours ending 10/13/18 0315  Labs/Imaging Results for orders placed or performed during the hospital encounter of 10/12/18 (from the past 48 hour(s))  Comprehensive metabolic panel     Status: Abnormal   Collection Time: 10/12/18 10:18 PM  Result Value Ref Range   Sodium 128 (L) 135 - 145 mmol/L   Potassium 3.8 3.5 - 5.1 mmol/L   Chloride 88 (L) 98 - 111 mmol/L   CO2 26 22 - 32 mmol/L   Glucose, Bld 97 70 - 99 mg/dL   BUN 7 6 - 20 mg/dL   Creatinine, Ser 0.49 0.44 - 1.00 mg/dL   Calcium 9.0 8.9 - 10.3 mg/dL   Total Protein 7.0 6.5 - 8.1 g/dL   Albumin 2.8 (L) 3.5 - 5.0 g/dL   AST 22 15 - 41 U/L   ALT 18 0 - 44 U/L   Alkaline Phosphatase 184 (H) 38 - 126 U/L   Total Bilirubin 0.9 0.3 -  1.2 mg/dL   GFR calc non Af Amer >60 >60 mL/min   GFR calc Af Amer >60 >60 mL/min   Anion gap 14 5 - 15    Comment: Performed at [facility], [place] [address], [place], [place] [address]  CBC with Differential     Status: Abnormal   Collection Time: 10/12/18 10:18 PM  Result Value Ref Range   WBC 10.0 4.0 - 10.5 K/uL   RBC 4.32 3.87 - 5.11 MIL/uL   Hemoglobin 11.2 (L) 12.0 - 15.0 g/dL   HCT 35.0 (L) 36.0 - 46.0 %   MCV 81.0 80.0 - 100.0 fL   MCH 25.9 (L) 26.0 - 34.0 pg   MCHC 32.0 30.0 - 36.0 g/dL   RDW 13.7 11.5 - 15.5 %   Platelets 183 150 - 400 K/uL   nRBC 0.0 0.0 - 0.2 %   Neutrophils Relative % 75 %   Neutro Abs 7.6 1.7 - 7.7 K/uL   Lymphocytes Relative 3 %   Lymphs Abs 0.3 (L) 0.7 - 4.0 K/uL   Monocytes Relative 7 %   Monocytes Absolute  0.7 0.1 - 1.0 K/uL   Eosinophils Relative 13 %   Eosinophils Absolute 1.3 (H) 0.0 - 0.5 K/uL   Basophils Relative 1 %   Basophils Absolute 0.1 0.0 - 0.1 K/uL   Immature Granulocytes 1 %   Abs Immature Granulocytes 0.08 (H) 0.00 - 0.07 K/uL    Comment: Performed at [facility], [place] [address], [place], [place] [address]  Troponin I - Once     Status: None   Collection Time: 10/12/18 10:18 PM  Result Value Ref Range   Troponin I <0.03 <0.03 ng/mL    Comment: Performed at [facility], [place] [address], [place], [place] [address]  SARS Coronavirus 2 [facility] order, Performed in [facility] hospital lab)     Status: None   Collection Time: 10/12/18 10:45 PM  Result Value Ref Range   SARS Coronavirus 2 NEGATIVE NEGATIVE    Comment: (NOTE) If result is NEGATIVE SARS-CoV-2 target nucleic acids are NOT DETECTED. The SARS-CoV-2 RNA is generally detectable in upper and lower  respiratory specimens during the acute phase of infection. The lowest  concentration of SARS-CoV-2 viral copies this assay can detect is 250  copies / mL. A negative result does not preclude SARS-CoV-2 infection  and should not be used as the sole basis for treatment or other  patient management decisions.  A negative result may occur with  improper specimen collection / handling, submission of specimen other  than nasopharyngeal swab, presence of viral mutation(s) within the  areas targeted by this assay, and inadequate number of viral copies  (<250 copies / mL). A negative result must be combined with clinical  observations, patient history, and epidemiological information. If result is POSITIVE SARS-CoV-2 target nucleic acids are DETECTED. The SARS-CoV-2 RNA is generally detectable in upper and lower  respiratory specimens dur ing the acute phase of infection.  Positive  results are indicative of active infection with SARS-CoV-2.  Clinical  correlation with patient history and  other diagnostic information is  necessary to determine patient infection status.  Positive results do  not rule out bacterial infection or co-infection with other viruses. If result is PRESUMPTIVE POSTIVE SARS-CoV-2 nucleic acids MAY BE PRESENT.   A presumptive positive result was obtained on the submitted specimen  and confirmed on repeat testing.  While 2019 novel coronavirus  (SARS-CoV-2) nucleic acids  may be present in the submitted sample  additional confirmatory testing may be necessary for epidemiological  and / or clinical management purposes  to differentiate between  SARS-CoV-2 and other Sarbecovirus currently known to infect humans.  If clinically indicated additional testing with an alternate test  methodology [phone]) is advised. The SARS-CoV-2 RNA is generally  detectable in upper and lower respiratory sp ecimens during the acute  phase of infection. The expected result is Negative. Fact Sheet for Patients:  StrictlyIdeas.no Fact Sheet for Healthcare Providers: BankingDealers.co.za This test is not yet approved or cleared by the [place] FDA and has been authorized for detection and/or diagnosis of SARS-CoV-2 by FDA under an Emergency Use Authorization (EUA).  This EUA will remain in effect (meaning this test can be used) for the duration of the COVID-19 declaration under Section 564(b)(1) of the Act, 21 U.S.C. section 360bbb-3(b)(1), unless the authorization is terminated or revoked sooner. Performed at [facility], [place] [address], [place], [place] [address]    Dg Chest 2 View  Result Date: 10/12/2018 CLINICAL DATA:  58 year old female with increasing shortness of breath. Stage IV lung cancer. EXAM: CHEST - 2 VIEW COMPARISON:  09/26/2018 and earlier. FINDINGS: Right hydropneumothorax demonstrated previously, the air component appears resolved but with interval increasing fluid component which is  at least partially loculated. Subsequent mildly decreased right lung ventilation since 09/26/2018. Stable cardiac size and mediastinal contours. Visualized tracheal air column is within normal limits. The left lung appears stable in clear. Negative visible bowel gas pattern. No acute osseous abnormality identified. IMPRESSION: 1. Increased right pleural effusion but resolved component of pneumothorax since 09/26/2018. 2. Overall right lung ventilation appears mildly decreased since the prior. 3. Left lung remains negative. Electronically Signed   By: Genevie Ann M.D.   On: 10/12/2018 22:55   Ct Angio Chest Pe W/cm &/or Wo Cm  Result Date: 10/13/2018 CLINICAL DATA:  Initial evaluation for acute dyspnea, angina. EXAM: CT ANGIOGRAPHY CHEST WITH CONTRAST TECHNIQUE: Multidetector CT imaging of the chest was performed using the standard protocol during bolus administration of intravenous contrast. Multiplanar CT image reconstructions and MIPs were obtained to evaluate the vascular anatomy. CONTRAST:  146mL OMNIPAQUE IOHEXOL 350 MG/ML SOLN COMPARISON:  Prior radiograph from 10/12/2018 as well as prior CT from 09/09/2018. FINDINGS: Cardiovascular: Intrathoracic aorta normal in caliber without aneurysm or other acute finding. Visualized great vessels intact and within normal limits. Heart and mediastinal structures mildly shifted to the left due to a large right pleural effusion. Heart size itself is within normal limits. Small pericardial effusion noted. Pulmonary arterial tree adequately opacified for evaluation. Main pulmonary artery within normal limits for caliber. Proximal segmental right pulmonary arteries attenuated by the right perihilar mass in large right pleural effusion. Evaluation of the distal left pulmonary arteries limited by motion artifact. No convincing filling defect to suggest acute pulmonary embolism. Re-formatted imaging confirms these findings. Mediastinum/Nodes: Visualized thyroid grossly normal.  Mildly enlarged 11 mm prevascular node noted. Enlarged subcarinal nodal conglomerate measures up to 2 cm in short axis. No left-sided hilar adenopathy. Periaortic nodes measure up to 12 mm. No enlarged axillary nodes. Lungs/Pleura: Large masslike opacity again seen involving the right upper lobe with involvement of the adjacent right hilum, measuring approximately 7.1 x 5.7 cm, likely similar to previous. Exact measurements somewhat difficult due to adjacent atelectatic lung. Obstruction of the adjacent right upper lobe bronchus. Bronchus intermedius is narrowed but remains patent. Attenuation and narrowing of the right upper lobe segmental pulmonary  arteries again noted. Associated large right pleural effusion with associated atelectasis, increased in size from previous. Lilly a small portion of the right lung is pneumatized. Scattered pleural based densities at the posterior left lower lobe likely reflect atelectasis. No other focal infiltrates. No pneumothorax. Few scattered nodular densities noted at the left lung apex, largest of which measures 8 mm (series 10, image 21), indeterminate. Upper Abdomen: Multiple hepatic metastases again seen, not fully evaluated on this exam, but grossly similar to previous. Scattered pericaval nodes measure up to 11 mm, grossly similar. Remainder of the visualized upper abdomen demonstrates no other acute finding. Musculoskeletal: Extensive venous collateral seen throughout the upper left chest. Asymmetric skin thickening noted at the left breast (series 4, image 73), indeterminate. Diffuse lytic metastases seen throughout the visualized osseous structures, similar to previous. Associated pathologic fractures involving the T6 and T8 vertebral bodies are similar to previous. Mildly progressive pathologic fracture at the superior endplate of V56 as compared to previous. There is an acute to subacute appearing fracture of the left lateral third rib, new from previous (series 4,  image 24). No other definite acute osseous abnormality. Review of the MIP images confirms the above findings. IMPRESSION: 1. No CT evidence for acute pulmonary embolism. 2. Large central right upper lobe mass with involvement of the right hilum, most compatible with primary bronchogenic carcinoma, grossly similar to previous. 3. Associated large right pleural effusion with associated compressive atelectasis. This may be malignant in nature. 4. Diffuse hepatic metastases, similar to previous. 5. Scattered mediastinal and upper abdominal adenopathy as above, likely reflecting metastatic disease. 6. Diffuse lytic osseous metastases. Associated pathologic compression fractures involving the T6 and T8 vertebral bodies are similar to previous. Associated height loss at the T10 vertebral body has progressed from previous. Acute to subacute fracture of the left lateral third rib also new from previous. 7. Increased skin thickening at the anterior left breast, indeterminate, and may be partially related to venous congestion given the multiple venous collaterals within the left chest wall. Correlation with physical exam and mammography suggested. 8. Few scattered subcentimeter pulmonary nodules measuring up to 8 mm at the left lung apex, indeterminate, but new from previous. Attention at follow-up recommended. Electronically Signed   By: Jeannine Boga M.D.   On: 10/13/2018 01:26    Pending Labs Unresulted Labs (From admission, onward)   None      Vitals/Pain Today's Vitals   10/13/18 0133 10/13/18 0138 10/13/18 0200 10/13/18 0230  BP: 107/90  126/84 111/86  Pulse: (!) 101  100 (!) 102  Resp: 16  18 18   Temp:      TempSrc:      SpO2: 99%  96% 97%  Weight:      Height:      PainSc:  8       Isolation Precautions No active isolations  Medications Medications  sodium chloride (PF) 0.9 % injection (has no administration in time range)  iohexol (OMNIPAQUE) 350 MG/ML injection 100 mL (100 mLs  Intravenous Contrast Given 10/13/18 0025)  morphine 4 MG/ML injection 6 mg (6 mg Intravenous Given 10/13/18 0140)    Mobility walks Low fall risk   Focused Assessments Pulmonary Assessment Handoff:  Lung sounds: L Breath Sounds: Clear R Breath Sounds: Diminished O2 Device: Nasal Cannula O2 Flow Rate (L/min): 3 L/min      R Recommendations: See Admitting Provider Note  Report given to:   Additional Notes: none

## 2018-10-13 NOTE — Progress Notes (Addendum)
Referring Physician(s): Mohamed,M/Rai,R  Supervising Physician: Jacqulynn Cadet  Patient Status:  [facility] - In-pt  Chief Complaint: Lung cancer, dyspnea, recurrent right pleural effusion   Subjective: Patient familiar to IR service from right thoracentesis on 09/22/2018 yielding 1.9 liters.  She has a known history of stage IV lung cancer with associated recurrent symptomatic large right pleural effusion.  Postthoracentesis she was noted to have ex vacuo pneumothorax.  Past medical history also significant for recent left upper extremity DVT, on Eliquis- last does 4/26.  CT angio of chest performed today revealed:   No CT evidence for acute pulmonary embolism. 2. Large central right upper lobe mass with involvement of the right hilum, most compatible with primary bronchogenic carcinoma, grossly similar to previous. 3. Associated large right pleural effusion with associated compressive atelectasis. This may be malignant in nature. 4. Diffuse hepatic metastases, similar to previous. 5. Scattered mediastinal and upper abdominal adenopathy as above, likely reflecting metastatic disease. 6. Diffuse lytic osseous metastases. Associated pathologic compression fractures involving the T6 and T8 vertebral bodies are similar to previous. Associated height loss at the T10 vertebral body has progressed from previous. Acute to subacute fracture of the left lateral third rib also new from previous. 7. Increased skin thickening at the anterior left breast, indeterminate, and may be partially related to venous congestion given the multiple venous collaterals within the left chest wall. Correlation with physical exam and mammography suggested. 8. Few scattered subcentimeter pulmonary nodules measuring up to 8 mm at the left lung apex, indeterminate, but new from previous.  Patient does not wish to undergo any further chemoradiation.  Palliative care has been consulted.  Request now received from  [facility] and oncology for right Pleurx catheter placement. She is COVID neg. She currently denies fever, headache, abdominal pain, bleeding.  She does have dyspnea, occasional cough, dry heaves, right lateral chest/back discomfort.   Past Medical History:  Diagnosis Date   Depression    Heart murmur    has not had in checked in "years"   Hypertension    No pertinent past medical history    Past Surgical History:  Procedure Laterality Date   ANTERIOR CERVICAL CORPECTOMY N/A 08/29/2018   Procedure: Cervical 3 Corpectomy with Cervical 2 to Cervical 4 plate;  Surgeon: Erline Levine, MD;  Location: [place];  Service: Neurosurgery;  Laterality: N/A;  Cervical 3 Corpectomy with Cervical 2 to Cervical 4 plate   KNEE ARTHROSCOPY WITH ANTERIOR CRUCIATE LIGAMENT (ACL) REPAIR  04/22/2012   Procedure: KNEE ARTHROSCOPY WITH ANTERIOR CRUCIATE LIGAMENT (ACL) REPAIR;  Surgeon: Hessie Dibble, MD;  Location: [place];  Service: Orthopedics;  Laterality: Right;   TONSILLECTOMY       Allergies: Patient has no known allergies.  Medications: Prior to Admission medications   Medication Sig Start Date End Date Taking? Authorizing Provider  Eliquis DVT/PE Starter Pack (ELIQUIS STARTER PACK) 5 MG TABS Take as directed on package: start with two-'5mg'$  tablets twice daily for 7 days. On day 8, switch to one-'5mg'$  tablet twice daily. Patient taking differently: Take 5 mg by mouth 2 (two) times a day.  09/28/18  Yes Pokhrel, Laxman, MD  guaiFENesin-dextromethorphan (ROBITUSSIN DM) 100-10 MG/5ML syrup Take 5 mLs by mouth every 4 (four) hours as needed for cough (chest congestion). 09/28/18  Yes Pokhrel, Laxman, MD  LORazepam (ATIVAN) 0.5 MG tablet Take 1 tablet (0.5 mg total) by mouth every 8 (eight) hours. Patient taking differently: Take 0.5 mg by mouth every 8 (eight) hours  as needed for anxiety (nausea).  09/29/18  Yes Gery Pray, MD  meclizine (ANTIVERT) 12.5 MG tablet Take 2 tablets (25 mg total)  by mouth 3 (three) times daily as needed for dizziness or nausea. 09/28/18  Yes Pokhrel, Laxman, MD  morphine (MSIR) 15 MG tablet Take 1 tablet (15 mg total) by mouth every 4 (four) hours as needed for severe pain. 10/01/18  Yes Gery Pray, MD  sodium chloride 1 g tablet Take 1 tablet (1 g total) by mouth 3 (three) times daily with meals. Patient not taking: Reported on 10/13/2018 09/28/18   Flora Lipps, MD     Vital Signs: BP (!) 120/94 (BP Location: Right Arm)    Pulse (!) 101    Temp 98.2 F (36.8 C) (Oral)    Resp 18    Ht _0  (1.676 m)    Wt 154 lb 5.2 oz (70 kg)    SpO2 100%    BMI 24.91 kg/m   Physical Exam awake, alert.  Chest with scattered rhonchi, diminished breath sounds right base; heart with slightly tachycardic but regular rhythm.  Abdomen soft, positive bowel sounds, nontender.  No sig lower extremity edema.  Imaging: Dg Chest 2 View  Result Date: 10/12/2018 CLINICAL DATA:  58 year old female with increasing shortness of breath. Stage IV lung cancer. EXAM: CHEST - 2 VIEW COMPARISON:  09/26/2018 and earlier. FINDINGS: Right hydropneumothorax demonstrated previously, the air component appears resolved but with interval increasing fluid component which is at least partially loculated. Subsequent mildly decreased right lung ventilation since 09/26/2018. Stable cardiac size and mediastinal contours. Visualized tracheal air column is within normal limits. The left lung appears stable in clear. Negative visible bowel gas pattern. No acute osseous abnormality identified. IMPRESSION: 1. Increased right pleural effusion but resolved component of pneumothorax since 09/26/2018. 2. Overall right lung ventilation appears mildly decreased since the prior. 3. Left lung remains negative. Electronically Signed   By: Genevie Ann M.D.   On: 10/12/2018 22:55   Ct Angio Chest Pe W/cm &/or Wo Cm  Result Date: 10/13/2018 CLINICAL DATA:  Initial evaluation for acute dyspnea, angina. EXAM: CT ANGIOGRAPHY  CHEST WITH CONTRAST TECHNIQUE: Multidetector CT imaging of the chest was performed using the standard protocol during bolus administration of intravenous contrast. Multiplanar CT image reconstructions and MIPs were obtained to evaluate the vascular anatomy. CONTRAST:  159m OMNIPAQUE IOHEXOL 350 MG/ML SOLN COMPARISON:  Prior radiograph from 10/12/2018 as well as prior CT from 09/09/2018. FINDINGS: Cardiovascular: Intrathoracic aorta normal in caliber without aneurysm or other acute finding. Visualized great vessels intact and within normal limits. Heart and mediastinal structures mildly shifted to the left due to a large right pleural effusion. Heart size itself is within normal limits. Small pericardial effusion noted. Pulmonary arterial tree adequately opacified for evaluation. Main pulmonary artery within normal limits for caliber. Proximal segmental right pulmonary arteries attenuated by the right perihilar mass in large right pleural effusion. Evaluation of the distal left pulmonary arteries limited by motion artifact. No convincing filling defect to suggest acute pulmonary embolism. Re-formatted imaging confirms these findings. Mediastinum/Nodes: Visualized thyroid grossly normal. Mildly enlarged 11 mm prevascular node noted. Enlarged subcarinal nodal conglomerate measures up to 2 cm in short axis. No left-sided hilar adenopathy. Periaortic nodes measure up to 12 mm. No enlarged axillary nodes. Lungs/Pleura: Large masslike opacity again seen involving the right upper lobe with involvement of the adjacent right hilum, measuring approximately 7.1 x 5.7 cm, likely similar to previous. Exact measurements somewhat difficult due to  adjacent atelectatic lung. Obstruction of the adjacent right upper lobe bronchus. Bronchus intermedius is narrowed but remains patent. Attenuation and narrowing of the right upper lobe segmental pulmonary arteries again noted. Associated large right pleural effusion with associated  atelectasis, increased in size from previous. Lilly a small portion of the right lung is pneumatized. Scattered pleural based densities at the posterior left lower lobe likely reflect atelectasis. No other focal infiltrates. No pneumothorax. Few scattered nodular densities noted at the left lung apex, largest of which measures 8 mm (series 10, image 21), indeterminate. Upper Abdomen: Multiple hepatic metastases again seen, not fully evaluated on this exam, but grossly similar to previous. Scattered pericaval nodes measure up to 11 mm, grossly similar. Remainder of the visualized upper abdomen demonstrates no other acute finding. Musculoskeletal: Extensive venous collateral seen throughout the upper left chest. Asymmetric skin thickening noted at the left breast (series 4, image 73), indeterminate. Diffuse lytic metastases seen throughout the visualized osseous structures, similar to previous. Associated pathologic fractures involving the T6 and T8 vertebral bodies are similar to previous. Mildly progressive pathologic fracture at the superior endplate of T46 as compared to previous. There is an acute to subacute appearing fracture of the left lateral third rib, new from previous (series 4, image 24). No other definite acute osseous abnormality. Review of the MIP images confirms the above findings. IMPRESSION: 1. No CT evidence for acute pulmonary embolism. 2. Large central right upper lobe mass with involvement of the right hilum, most compatible with primary bronchogenic carcinoma, grossly similar to previous. 3. Associated large right pleural effusion with associated compressive atelectasis. This may be malignant in nature. 4. Diffuse hepatic metastases, similar to previous. 5. Scattered mediastinal and upper abdominal adenopathy as above, likely reflecting metastatic disease. 6. Diffuse lytic osseous metastases. Associated pathologic compression fractures involving the T6 and T8 vertebral bodies are similar to  previous. Associated height loss at the T10 vertebral body has progressed from previous. Acute to subacute fracture of the left lateral third rib also new from previous. 7. Increased skin thickening at the anterior left breast, indeterminate, and may be partially related to venous congestion given the multiple venous collaterals within the left chest wall. Correlation with physical exam and mammography suggested. 8. Few scattered subcentimeter pulmonary nodules measuring up to 8 mm at the left lung apex, indeterminate, but new from previous. Attention at follow-up recommended. Electronically Signed   By: Jeannine Boga M.D.   On: 10/13/2018 01:26    Labs:  CBC: Recent Labs    09/27/18 0751 09/28/18 0450 10/12/18 2218 10/13/18 0449  WBC 19.1* 18.4* 10.0 9.0  HGB 12.9 12.2 11.2* 11.2*  HCT 40.0 38.1 35.0* 34.9*  PLT 204 259 183 182    COAGS: Recent Labs    09/22/18 1135 09/23/18 0456 10/13/18 1131  INR 1.8* 1.7* 3.4*    BMP: Recent Labs    09/27/18 0751 09/28/18 0719 10/12/18 2218 10/13/18 0449  NA 127* 127* 128* 128*  K 4.3 4.3 3.8 4.1  CL 91* 89* 88* 88*  CO2 _0 GLUCOSE 121* 112* 97 107*  BUN _1 CALCIUM 8.7* 9.0 9.0 9.1  CREATININE 0.63 0.61 0.49 0.47  GFRNONAA >60 >60 >60 >60  GFRAA >60 >60 >60 >60    LIVER FUNCTION TESTS: Recent Labs    08/22/18 1052 09/22/18 1135 10/12/18 2218  BILITOT 0.1* 0.6 0.9  AST _2 ALT _3 ALKPHOS 150* 294* 184*  PROT 7.6 8.0 7.0  ALBUMIN 4.0 3.6 2.8*    Assessment and Plan:  Pt with history of stage IV lung cancer with associated recurrent symptomatic large right pleural effusion.  Post right thoracentesis 09/22/18 (1.9 liters removed) she was noted to have ex vacuo pneumothorax.  Past medical history also significant for recent left upper extremity DVT, on Eliquis- last does 4/26.  CT angio of chest performed today revealed:   No CT evidence for acute pulmonary embolism. 2. Large central  right upper lobe mass with involvement of the right hilum, most compatible with primary bronchogenic carcinoma, grossly similar to previous. 3. Associated large right pleural effusion with associated compressive atelectasis. This may be malignant in nature. 4. Diffuse hepatic metastases, similar to previous. 5. Scattered mediastinal and upper abdominal adenopathy as above, likely reflecting metastatic disease. 6. Diffuse lytic osseous metastases. Associated pathologic compression fractures involving the T6 and T8 vertebral bodies are similar to previous. Associated height loss at the T10 vertebral body has progressed from previous. Acute to subacute fracture of the left lateral third rib also new from previous. 7. Increased skin thickening at the anterior left breast, indeterminate, and may be partially related to venous congestion given the multiple venous collaterals within the left chest wall. Correlation with physical exam and mammography suggested. 8. Few scattered subcentimeter pulmonary nodules measuring up to 8 mm at the left lung apex, indeterminate, but new from previous.  Patient does not wish to undergo any further chemoradiation.  Palliative care has been consulted.  Request now received from [facility] and oncology for right Pleurx catheter placement. She is COVID neg. Imaging studies have been reviewed by Dr. Laurence Ferrari.  Details/risks of procedure, including but not limited to, internal bleeding, infection, injury to adjacent structures, inability to improve chest discomfort, dyspnea or cough discussed with patient with her understanding and consent. PT 33.7/INR 3.4 today which is too elevated for placement of drain at this time- will cont to monitor and schedule once safe parameters are met.   Electronically Signed: D. Rowe Robert, PA-C 10/13/2018, 1:32 PM   I spent a total of 25 minutes at the the patient's bedside AND on the patient's hospital floor or unit, greater than 50%  of which was counseling/coordinating care for right Pleurx catheter placement    Patient ID: Mariah Diaz, female   DOB: December 05, 1960, 58 y.o.   MRN: [id number]

## 2018-10-13 NOTE — Telephone Encounter (Signed)
Admitting MD -Dr Rai-ordered pleurix catheter for large pleural effusion for tomorrow -pt holding eloquis today. Is Mariah Diaz okay for IR to proceed with Plerurix?

## 2018-10-14 ENCOUNTER — Inpatient Hospital Stay: Payer: Managed Care, Other (non HMO) | Admitting: Internal Medicine

## 2018-10-14 ENCOUNTER — Inpatient Hospital Stay: Payer: Managed Care, Other (non HMO)

## 2018-10-14 DIAGNOSIS — C3490 Malignant neoplasm of unspecified part of unspecified bronchus or lung: Secondary | ICD-10-CM

## 2018-10-14 DIAGNOSIS — Z86718 Personal history of other venous thrombosis and embolism: Secondary | ICD-10-CM

## 2018-10-14 LAB — CBC
HCT: 34.7 % — ABNORMAL LOW (ref 36.0–46.0)
Hemoglobin: 10.9 g/dL — ABNORMAL LOW (ref 12.0–15.0)
MCH: 25.8 pg — ABNORMAL LOW (ref 26.0–34.0)
MCHC: 31.4 g/dL (ref 30.0–36.0)
MCV: 82.2 fL (ref 80.0–100.0)
Platelets: 173 10*3/uL (ref 150–400)
RBC: 4.22 MIL/uL (ref 3.87–5.11)
RDW: 14 % (ref 11.5–15.5)
WBC: 9.3 10*3/uL (ref 4.0–10.5)
nRBC: 0 % (ref 0.0–0.2)

## 2018-10-14 LAB — COMPREHENSIVE METABOLIC PANEL
ALT: 19 U/L (ref 0–44)
AST: 25 U/L (ref 15–41)
Albumin: 2.7 g/dL — ABNORMAL LOW (ref 3.5–5.0)
Alkaline Phosphatase: 174 U/L — ABNORMAL HIGH (ref 38–126)
Anion gap: 14 (ref 5–15)
BUN: 8 mg/dL (ref 6–20)
CO2: 26 mmol/L (ref 22–32)
Calcium: 8.9 mg/dL (ref 8.9–10.3)
Chloride: 90 mmol/L — ABNORMAL LOW (ref 98–111)
Creatinine, Ser: 0.52 mg/dL (ref 0.44–1.00)
GFR calc Af Amer: >60 mL/min (ref >60)
GFR calc non Af Amer: >60 mL/min (ref >60)
Glucose, Bld: 97 mg/dL (ref 70–99)
Potassium: 4.3 mmol/L (ref 3.5–5.1)
Sodium: 130 mmol/L — ABNORMAL LOW (ref 135–145)
Total Bilirubin: 0.9 mg/dL (ref 0.3–1.2)
Total Protein: 6.5 g/dL (ref 6.5–8.1)

## 2018-10-14 LAB — PROTIME-INR
INR: 3.2 — ABNORMAL HIGH (ref 0.8–1.2)
Prothrombin Time: 32.5 seconds — ABNORMAL HIGH (ref 11.4–15.2)

## 2018-10-14 MED ORDER — OXYCODONE HCL ER 10 MG PO T12A
10.0000 mg | EXTENDED_RELEASE_TABLET | Freq: Two times a day (BID) | ORAL | Status: DC
  Filled 2018-10-14 (×2): qty 1

## 2018-10-14 NOTE — Evaluation (Signed)
Physical Therapy Evaluation Patient Details Name: Mariah Diaz MRN: [id number] DOB: [birth year] Today's Date: 10/14/2018   History of Present Illness  58 y.o. 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 Earlie Server, with mets to liver and brain, hypertension recently admitted on 09/22/2018 and discharged on 9 April, 2020 presents today with generalized weakness and some shortness of breath.  Patient underwent thoracentesis on 09/22/18 and she was found to have hydropneumothorax secondary to underlying lung cancer.  Unfortunately her right upper lobe and middle lobe has not expanded despite thoracentesis due to underlying lung cancer and chest tube placement is ruled out by IR.  Patient also reports some persistent nausea, vomiting and loose bowel movements. Dx of hyponatremia.    Clinical Impression  Pt admitted with above diagnosis. Pt currently with functional limitations due to the deficits listed below (see PT Problem List). Pt ambulated 60' with RW, SaO2 97% on 4L walking, distance limited by fatigue. Reviewed pursed lip breathing and energy conservation techniques. Pt will benefit from skilled PT to increase their independence and safety with mobility to allow discharge to the venue listed below.       Follow Up Recommendations Home health PT; Home Health Aide due to fatigue with minimal activity    Equipment Recommendations  None recommended by PT    Recommendations for Other Services       Precautions / Restrictions Precautions Precautions: Other (comment) Precaution Comments: monitor O2 Restrictions Weight Bearing Restrictions: No      Mobility  Bed Mobility Overal bed mobility: Modified Independent             General bed mobility comments: used rail, HOB up  Transfers Overall transfer level: Modified independent Equipment used: Rolling walker (2 wheeled)                 Ambulation/Gait Ambulation/Gait assistance: Modified independent (Device/Increase time) Gait Distance (Feet): 45 Feet Assistive device: Rolling walker (2 wheeled) Gait Pattern/deviations: Step-through pattern;Decreased stride length Gait velocity: WFL   General Gait Details: SaO2 97% on 4L with walking, distance limited by fatigue, pt has good technique with pursed lip breathing  Stairs            Wheelchair Mobility    Modified Rankin (Stroke Patients Only)       Balance Overall balance assessment: Modified Independent                                           Pertinent Vitals/Pain Pain Assessment: No/denies pain    Home Living Family/patient expects to be discharged to:: Private residence Living Arrangements: Alone Available Help at Discharge: Friend(s) Type of Home: House Home Access: Stairs to enter Entrance Stairs-Rails: Can reach both Entrance Stairs-Number of Steps: 3 Home Layout: One level Home Equipment: Crutches;Shower seat;Hand held Tourist information centre manager - 4 wheels      Prior Function Level of Independence: Independent with assistive device(s)         Comments: pt reports she has friends who can assist with driving     Hand Dominance   Dominant Hand: Right    Extremity/Trunk Assessment   Upper Extremity Assessment Upper Extremity Assessment: Overall WFL for tasks assessed    Lower Extremity Assessment Lower Extremity Assessment: Overall WFL for tasks assessed    Cervical / Trunk Assessment Cervical / Trunk Assessment: Normal  Communication   Communication: No difficulties  Cognition Arousal/Alertness: Awake/alert Behavior During Therapy: WFL for tasks assessed/performed Overall Cognitive Status: Within Functional Limits for tasks assessed                                        General Comments      Exercises     Assessment/Plan    PT Assessment Patient needs continued PT services  PT  Problem List Decreased activity tolerance;Decreased mobility       PT Treatment Interventions DME instruction;Gait training;Therapeutic exercise;Therapeutic activities;Patient/family education    PT Goals (Current goals can be found in the Care Plan section)  Acute Rehab PT Goals Patient Stated Goal: return home, be able to tolerate increased activity PT Goal Formulation: With patient Time For Goal Achievement: 10/11/18 Potential to Achieve Goals: Fair    Frequency Min 3X/week   Barriers to discharge        Co-evaluation               AM-PAC PT "6 Clicks" Mobility  Outcome Measure Help needed turning from your back to your side while in a flat bed without using bedrails?: None Help needed moving from lying on your back to sitting on the side of a flat bed without using bedrails?: None Help needed moving to and from a bed to a chair (including a wheelchair)?: None Help needed standing up from a chair using your arms (e.g., wheelchair or bedside chair)?: None Help needed to walk in hospital room?: A Little Help needed climbing 3-5 steps with a railing? : A Little 6 Click Score: 22    End of Session Equipment Utilized During Treatment: Oxygen Activity Tolerance: Patient tolerated treatment well;Patient limited by fatigue Patient left: with call bell/phone within reach;in bed Nurse Communication: Mobility status PT Visit Diagnosis: Difficulty in walking, not elsewhere classified (R26.2)    Time: 2446-9507 PT Time Calculation (min) (ACUTE ONLY): 13 min   Charges:   PT Evaluation $PT Eval Low Complexity: 1 Low          Philomena Doheny PT 10/14/2018  Acute Rehabilitation Services Pager [phone] Office [phone]

## 2018-10-14 NOTE — TOC Initial Note (Signed)
Transition of Care [facility]) - Initial/Assessment Note    Patient Details  Name: Mariah Diaz MRN: [id number] Date of Birth: [birth year]  Transition of Care [facility] CM/SW Contact:    Purcell Mouton, RN Phone Number: 10/14/2018, 4:00 PM  Clinical Narrative: Pt admitted with SOB, Pleural effusion.                   Expected Discharge Plan: [place]     Patient Goals and CMS Choice     Choice offered to / list presented to : Patient  Expected Discharge Plan and Services Expected Discharge Plan: [place]       Living arrangements for the past 2 months: Single Family Home                                      Prior Living Arrangements/Services Living arrangements for the past 2 months: Single Family Home Lives with:: Self   Do you feel safe going back to the place where you live?: Yes               Activities of Daily Living Home Assistive Devices/Equipment: None ADL Screening (condition at time of admission) Patient's cognitive ability adequate to safely complete daily activities?: Yes Is the patient deaf or have difficulty hearing?: No Does the patient have difficulty seeing, even when wearing glasses/contacts?: No Does the patient have difficulty concentrating, remembering, or making decisions?: No Patient able to express need for assistance with ADLs?: Yes Does the patient have difficulty dressing or bathing?: No Independently performs ADLs?: Yes (appropriate for developmental age) Does the patient have difficulty walking or climbing stairs?: No Weakness of Legs: None Weakness of Arms/Hands: None  Permission Sought/Granted                  Emotional Assessment     Affect (typically observed): Accepting Orientation: : Oriented to Self, Oriented to Place, Oriented to  Time, Oriented to Situation      Admission diagnosis:  SOB (shortness of breath) [R06.02] Pleural effusion on right [J90] Patient  Active Problem List   Diagnosis Date Noted  . Pleural effusion 10/13/2018  . Metastatic lung cancer (metastasis from lung to other site) ([place]) 10/13/2018  . Chronic anemia 10/13/2018  . History of DVT (deep vein thrombosis) 10/13/2018  . Chronic hyponatremia 09/26/2018  . Pneumothorax 09/23/2018  . Pneumothorax, acute 09/22/2018  . Brain metastases ([place]) 09/10/2018  . Liver metastases ([place]) 09/10/2018  . Metastatic cancer to spine ([place]) 08/29/2018  . Bone metastasis ([place]) 08/23/2018  . Malignant neoplasm of unspecified part of unspecified bronchus or lung ([place]) 08/23/2018   PCP:  Orpah Melter, MD Pharmacy:   [id number] - [place], [place] [address] Phone: [phone] Fax: [phone]     Social Determinants of Health (SDOH) Interventions    Readmission Risk Interventions No flowsheet data found.

## 2018-10-14 NOTE — Progress Notes (Signed)
[facility]                                                                              Patient Demographics  Mariah Diaz, is a 58 y.o. 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  Patient was admitted from 4/10 to 4/12 and was diagnosed with left upper extremity DVT and was discharged home on Eliquis.  Last thoracentesis done on 09/22/2018, currently on 3 L O2 at home. Per patient she has continued to go to her radiation, last was on Friday past week.  Over the past 1 to 2 days, she has felt very short of breath even at rest with dry cough.  Otherwise no fever chills, chest pain, sick contacts, recent travel or exposure to any individuals with suspected or confirmed COVID-19.  Patient has also significant chronic mid to lower back pain secondary to metastasis to her bones.  She also reports that home pain management is not working.  Assessment & Plan    Principal Problem: Recurrent right pleural effusion, likely malignant in the setting of non-small cell lung cancer, metastatic -COVID-19 negative, no wheezing, currently on home 3 L O2, stable, afebrile. -CT angiogram negative for PE, showed large central right upper lobe mass with involvement of the right hilum, compatible with primary bronchogenic carcinoma, associated large right pleural effusion with compressive atelectasis. -IR consulted, n.p.o. after midnight for Pleurx catheter on  4/29 -Oncology following.  Palliative care also consulted for goals of care, pain management, patient is open to the hospice support.  Active problems Metastatic stage IV non-small cell lung cancer with mets to liver, brain and bone -Follows Dr. Julien Nordmann, currently worsening back pain, no neuro deficits. -CT showed diffuse lytic osseous metastasis including compression fractures of T6 and T8 vertebral bodies similar to previous imaging, acute to subacute new fracture of left lateral third rib, receiving palliative XRT. -Palliative medicine consult for goals of care and pain management. - currently on Norco as needed and morphine as needed, per patient she is still in significant pain, add long-acting OxyContin.  Await palliative medicine recommendations as well.  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  Currently at baseline.  Elevated alk phos -Likely due to bony metastasis from stage IV lung CA  Chronic normocytic anemia likely due to chronic disease/malignancy -Anemia panel reviewed consistent with anemia of chronic disease.  Hemoglobin at baseline  Recent left upper extremity DVT -Hold Eliquis until IR evaluation and Pleurx catheter placement  Essential hypertension -BP currently stable  Generalized debility PT evaluation pending   Code Status: DNR DVT Prophylaxis: Eliquis currently on hold for IR procedure Family Communication: Discussed in detail with the patient, all imaging results, lab results explained to the patient.  Patient does not have close family support, no children.  She is alert and oriented.   Disposition Plan:   Time Spent in minutes 25 minutes  Procedures:  CT angiogram of the chest  Consultants:   Oncology, Dr. Julien Nordmann Interventional radiology  Antimicrobials:   Anti-infectives (From admission, onward)   None          Medications  Scheduled Meds:  sodium chloride  1 g Oral TID WC   Continuous Infusions: PRN Meds:.acetaminophen **OR** acetaminophen, benzonatate, HYDROcodone-acetaminophen, LORazepam, meclizine, morphine injection, phenol      Subjective:   Mariah Diaz was seen and examined today.  Still in significant pain, 7/10.  Afebrile.  Awaiting IR procedure.  Patient denies dizziness, abdominal pain, nausea vomiting or new weakness.  Objective:   Vitals:   10/13/18 0430 10/13/18 1354 10/13/18 2032 10/14/18 0516  BP:  119/85 129/88 (!) 115/92  Pulse:  (!) 101 98 98  Resp:   15 14  Temp:  98.4 F (36.9 C) 98.3 F (36.8 C) 98.2 F (36.8 C)  TempSrc:  Oral Oral Oral  SpO2:  100% 99% 100%  Weight: 70 kg     Height: _0  (1.676 m)       Intake/Output Summary (Last 24 hours) at 10/14/2018 1313 Last data filed at 10/14/2018 1205 Gross per 24 hour  Intake 380 ml  Output --  Net 380 ml     Wt Readings from Last 3 Encounters:  10/13/18 70 kg  09/26/18 67.7 kg  08/29/18 78.5 kg   Physical Exam  General: Alert and oriented x 3, NAD, appears somewhat short of breath, in discomfort  Eyes:   HEENT:  Atraumatic, normocephalic  Cardiovascular: S1 S2 clear, no murmurs, RRR. No pedal edema b/l  Respiratory: Diminished breath sounds right > left  Gastrointestinal: Soft, nontender, nondistended, NBS  Ext: no pedal edema bilaterally  Neuro: no new deficits  Musculoskeletal: No cyanosis, clubbing  Skin: No rashes  Psych: Normal affect and demeanor, alert and oriented x3    Data Reviewed:  I have personally reviewed following labs and imaging studies  Micro Results Recent Results (from the past 240 hour(s))  SARS Coronavirus 2 [facility] order, Performed in [place] hospital lab)     Status: None   Collection Time: 10/12/18 10:45 PM  Result Value Ref Range Status   SARS Coronavirus 2 NEGATIVE NEGATIVE Final    Comment: (NOTE) If result is  NEGATIVE SARS-CoV-2 target nucleic acids are NOT DETECTED. The SARS-CoV-2 RNA is generally detectable in upper and lower  respiratory specimens during the acute phase of infection. The lowest  concentration of SARS-CoV-2 viral copies this assay can detect is 250  copies / mL. A negative result does not preclude SARS-CoV-2 infection  and should not be used as the sole basis for treatment or other  patient management decisions.  A negative result may occur with  improper specimen collection / handling, submission of specimen other  than nasopharyngeal swab, presence of viral mutation(s) within the  areas targeted by this assay, and inadequate number of viral copies  (<250 copies / mL). A negative result must be combined with clinical  observations, patient history, and epidemiological information. If result is POSITIVE SARS-CoV-2 target nucleic acids are DETECTED. The SARS-CoV-2 RNA is generally  detectable in upper and lower  respiratory specimens dur ing the acute phase of infection.  Positive  results are indicative of active infection with SARS-CoV-2.  Clinical  correlation with patient history and other diagnostic information is  necessary to determine patient infection status.  Positive results do  not rule out bacterial infection or co-infection with other viruses. If result is PRESUMPTIVE POSTIVE SARS-CoV-2 nucleic acids MAY BE PRESENT.   A presumptive positive result was obtained on the submitted specimen  and confirmed on repeat testing.  While 2019 novel coronavirus  (SARS-CoV-2) nucleic acids may be present in the submitted sample  additional confirmatory testing may be necessary for epidemiological  and / or clinical management purposes  to differentiate between  SARS-CoV-2 and other Sarbecovirus currently known to infect humans.  If clinically indicated additional testing with an alternate test  methodology [phone]) is advised. The SARS-CoV-2 RNA is generally  detectable  in upper and lower respiratory sp ecimens during the acute  phase of infection. The expected result is Negative. Fact Sheet for Patients:  StrictlyIdeas.no Fact Sheet for Healthcare Providers: BankingDealers.co.za This test is not yet approved or cleared by the [place] FDA and has been authorized for detection and/or diagnosis of SARS-CoV-2 by FDA under an Emergency Use Authorization (EUA).  This EUA will remain in effect (meaning this test can be used) for the duration of the COVID-19 declaration under Section 564(b)(1) of the Act, 21 U.S.C. section 360bbb-3(b)(1), unless the authorization is terminated or revoked sooner. Performed at [facility], [place] [address], [place], [place] [address]     Radiology Reports Dg Chest 1 View  Result Date: 09/25/2018 CLINICAL DATA:  Aspiration into airway EXAM: CHEST  1 VIEW COMPARISON:  Two days ago FINDINGS: Large but diminished right pneumothorax. Moderate pleural effusion on the right with pulmonary opacification. There is pulmonary obstruction by CT. Comparatively clear left chest. Borderline heart size. IMPRESSION: Large right hydropneumothorax with decreasing gas component since 2 days ago. Electronically Signed   By: Monte Fantasia M.D.   On: 09/25/2018 07:48   Dg Chest 1 View  Result Date: 09/22/2018 CLINICAL DATA:  Stage for lung cancer diagnosed 1 month ago. Shortness of breath. EXAM: CHEST  1 VIEW COMPARISON:  Earlier same day FINDINGS: Heart size is normal. Left chest remains clear. There has been previous right thoracentesis with removal of the left pleural fluid. There is a right pneumothorax estimated at 50-60%. No tension is evident. IMPRESSION: 50-60% right pneumothorax following thoracentesis. No visible residual pleural fluid. No evidence of tension. These results will be called to the ordering clinician or representative by the Radiologist Assistant, and  communication documented in the PACS or zVision Dashboard. Electronically Signed   By: Nelson Chimes M.D.   On: 09/22/2018 15:34   Dg Chest 2 View  Result Date: 10/12/2018 CLINICAL DATA:  58 year old female with increasing shortness of breath. Stage IV lung cancer. EXAM: CHEST - 2 VIEW COMPARISON:  09/26/2018 and earlier. FINDINGS: Right hydropneumothorax demonstrated previously, the air component appears resolved but with interval increasing fluid component which is at least partially loculated. Subsequent mildly decreased right lung ventilation since 09/26/2018. Stable cardiac size and mediastinal contours. Visualized tracheal air column is within normal limits. The left lung appears stable in clear. Negative visible bowel gas pattern. No acute osseous abnormality identified. IMPRESSION: 1. Increased right pleural effusion but resolved component of pneumothorax since 09/26/2018. 2. Overall right lung ventilation appears mildly decreased since the prior. 3. Left lung remains  negative. Electronically Signed   By: Genevie Ann M.D.   On: 10/12/2018 22:55   Ct Angio Chest Pe W/cm &/or Wo Cm  Result Date: 10/13/2018 CLINICAL DATA:  Initial evaluation for acute dyspnea, angina. EXAM: CT ANGIOGRAPHY CHEST WITH CONTRAST TECHNIQUE: Multidetector CT imaging of the chest was performed using the standard protocol during bolus administration of intravenous contrast. Multiplanar CT image reconstructions and MIPs were obtained to evaluate the vascular anatomy. CONTRAST:  151m OMNIPAQUE IOHEXOL 350 MG/ML SOLN COMPARISON:  Prior radiograph from 10/12/2018 as well as prior CT from 09/09/2018. FINDINGS: Cardiovascular: Intrathoracic aorta normal in caliber without aneurysm or other acute finding. Visualized great vessels intact and within normal limits. Heart and mediastinal structures mildly shifted to the left due to a large right pleural effusion. Heart size itself is within normal limits. Small pericardial effusion noted.  Pulmonary arterial tree adequately opacified for evaluation. Main pulmonary artery within normal limits for caliber. Proximal segmental right pulmonary arteries attenuated by the right perihilar mass in large right pleural effusion. Evaluation of the distal left pulmonary arteries limited by motion artifact. No convincing filling defect to suggest acute pulmonary embolism. Re-formatted imaging confirms these findings. Mediastinum/Nodes: Visualized thyroid grossly normal. Mildly enlarged 11 mm prevascular node noted. Enlarged subcarinal nodal conglomerate measures up to 2 cm in short axis. No left-sided hilar adenopathy. Periaortic nodes measure up to 12 mm. No enlarged axillary nodes. Lungs/Pleura: Large masslike opacity again seen involving the right upper lobe with involvement of the adjacent right hilum, measuring approximately 7.1 x 5.7 cm, likely similar to previous. Exact measurements somewhat difficult due to adjacent atelectatic lung. Obstruction of the adjacent right upper lobe bronchus. Bronchus intermedius is narrowed but remains patent. Attenuation and narrowing of the right upper lobe segmental pulmonary arteries again noted. Associated large right pleural effusion with associated atelectasis, increased in size from previous. Lilly a small portion of the right lung is pneumatized. Scattered pleural based densities at the posterior left lower lobe likely reflect atelectasis. No other focal infiltrates. No pneumothorax. Few scattered nodular densities noted at the left lung apex, largest of which measures 8 mm (series 10, image 21), indeterminate. Upper Abdomen: Multiple hepatic metastases again seen, not fully evaluated on this exam, but grossly similar to previous. Scattered pericaval nodes measure up to 11 mm, grossly similar. Remainder of the visualized upper abdomen demonstrates no other acute finding. Musculoskeletal: Extensive venous collateral seen throughout the upper left chest. Asymmetric skin  thickening noted at the left breast (series 4, image 73), indeterminate. Diffuse lytic metastases seen throughout the visualized osseous structures, similar to previous. Associated pathologic fractures involving the T6 and T8 vertebral bodies are similar to previous. Mildly progressive pathologic fracture at the superior endplate of TE56as compared to previous. There is an acute to subacute appearing fracture of the left lateral third rib, new from previous (series 4, image 24). No other definite acute osseous abnormality. Review of the MIP images confirms the above findings. IMPRESSION: 1. No CT evidence for acute pulmonary embolism. 2. Large central right upper lobe mass with involvement of the right hilum, most compatible with primary bronchogenic carcinoma, grossly similar to previous. 3. Associated large right pleural effusion with associated compressive atelectasis. This may be malignant in nature. 4. Diffuse hepatic metastases, similar to previous. 5. Scattered mediastinal and upper abdominal adenopathy as above, likely reflecting metastatic disease. 6. Diffuse lytic osseous metastases. Associated pathologic compression fractures involving the T6 and T8 vertebral bodies are similar to previous. Associated height loss at  the T10 vertebral body has progressed from previous. Acute to subacute fracture of the left lateral third rib also new from previous. 7. Increased skin thickening at the anterior left breast, indeterminate, and may be partially related to venous congestion given the multiple venous collaterals within the left chest wall. Correlation with physical exam and mammography suggested. 8. Few scattered subcentimeter pulmonary nodules measuring up to 8 mm at the left lung apex, indeterminate, but new from previous. Attention at follow-up recommended. Electronically Signed   By: Jeannine Boga M.D.   On: 10/13/2018 01:26   Mr Jeri Cos UM Contrast  Result Date: 09/15/2018 CLINICAL DATA:  SRS  targeting. Pathology from the C3 vertebral body yielded metastatic non-small cell carcinoma, consistent with origin from lung. EXAM: MRI HEAD WITHOUT AND WITH CONTRAST TECHNIQUE: Multiplanar, multiecho pulse sequences of the brain and surrounding structures were obtained without and with intravenous contrast. CONTRAST:  43m MULTIHANCE GADOBENATE DIMEGLUMINE 529 MG/ML IV SOLN COMPARISON:  09/05/2018. FINDINGS: Brain: Multiple metastatic deposits are redemonstrated. These are listed from inferior to superior as follows as seen on series 11 axial postcontrast T1 weighted imaging: Image 29, RIGHT cerebellum, 2 mm. Image 41, RIGHT lower pons, 5 mm. Image 53, RIGHT mid pons, 4 mm. Image 56, LEFT paramedian vermis, 1 mm. Image 58, LEFT lateral temporal lobe, 5 mm. Image 59, LEFT medial temporal lobe, 7 x 9 mm. Image 79, RIGHT posterior temporal lobe, 2 mm. Image 89, medial RIGHT frontal lobe, 2 mm. Image 92, RIGHT subinsular white matter, 4 mm. Image 117, LEFT posterior frontal lobe, 4 mm. Image 122, LEFT medial anterior parietal lobe, 2 mm. Image 132, LEFT posterior frontal lobe, 5 mm. Image 132, RIGHT lateral frontal lobe, 4 mm. Image 138, RIGHT superior frontal parasagittal cortex, 5 mm. There may be other lesions which are not detected due to difficulty in distinguishing small lesions from cortical vessels. Redemonstrated are scattered areas of vasogenic edema, affecting not only the cerebral hemispheres but the brainstem. Vascular: Normal flow voids. Skull and upper cervical spine: Normal marrow signal. Cervical spine poorly visualized. Sinuses/Orbits: No significant orbital or paranasal sinus findings. Other: None. IMPRESSION: At least 14 intracranial metastatic deposits are identified, although due to the widespread nature, and small size of some lesions, it is possible that other metastases are undetected. See discussion above. Electronically Signed   By: JStaci RighterM.D.   On: 09/15/2018 14:02   Dg Chest Port  1 View  Addendum Date: 09/26/2018   ADDENDUM REPORT: 09/26/2018 14:08 ADDENDUM: The chest x-ray was reviewed at the request of Dr. YDarl Householderfor potential need for a chest tube. When compared to the 04/9 study, there is further interval expansion of the right lower lobe and compared to the 4/7 and 4/6 chest x-rays, there is considerably improved aeration of the right lower lung. The right upper lobe is known to be densely involved with tumor and would not be expected to re-expand. There is some expected reaccumulation of right pleural fluid since thoracentesis of moderate volume. Electronically Signed   By: GAletta EdouardM.D.   On: 09/26/2018 14:08   Result Date: 09/26/2018 CLINICAL DATA:  Shortness of breath, cough, nausea and vomiting. EXAM: PORTABLE CHEST 1 VIEW COMPARISON:  Single-view of the chest 09/25/2018, 09/23/2018 and 09/22/2018. FINDINGS: Right hydropneumothorax seen on the prior exams persists and appears unchanged since the most recent study. The left lung is clear and well expanded. No left effusion. Heart size is normal. IMPRESSION: No change in a large right hydropneumothorax  since the most recent exam. No new abnormality. Electronically Signed: By: Inge Rise M.D. On: 09/26/2018 13:26   Portable Chest 1 View  Result Date: 09/23/2018 CLINICAL DATA:  Right pneumothorax. EXAM: PORTABLE CHEST 1 VIEW COMPARISON:  09/22/2018 FINDINGS: There is a persistent large right pneumothorax with further collapse of the right lower lobe. The right middle and upper lobes appear completely collapsed. There is a recurrent small right effusion. Left lung is clear. Heart size and pulmonary vascularity are normal. No evidence of tension pneumothorax. IMPRESSION: Persistent large right pneumothorax with further collapse of the right lower lobe and with complete collapse of the right middle and upper lobes. The previous CT scan demonstrated a mass obstructing the upper lobe bronchus. Electronically Signed   By:  Lorriane Shire M.D.   On: 09/23/2018 08:01   Dg Chest Port 1 View  Result Date: 09/22/2018 CLINICAL DATA:  Followup right pneumothorax. EXAM: PORTABLE CHEST 1 VIEW COMPARISON:  Earlier same day FINDINGS: Persistent right pneumothorax with complete collapse of the right lung. Pneumothorax is probably enlarging slightly, now estimated at 60-70%. No definite evidence of tension. IMPRESSION: Probable slight worsening of the right pneumothorax, estimated at 60-70%. Electronically Signed   By: Nelson Chimes M.D.   On: 09/22/2018 17:13   Dg Chest Port 1 View  Result Date: 09/22/2018 CLINICAL DATA:  Shortness of breath 2 days. Recently diagnosed with right-sided stage IV lung cancer. EXAM: PORTABLE CHEST 1 VIEW COMPARISON:  Chest CT 09/09/2018 FINDINGS: Exam demonstrates moderate opacification over the right lung compatible with moderate size effusion tracking to the apex. Findings are similar to patient's recent CT scan which demonstrated a patient's known large right upper lobe lung cancer with large right effusion. Borders of the known right upper lobe mass or not well visualized. Left lung is clear. Cardiomediastinal silhouette is within normal. Mild mottled sclerosis over the left scapula in the region of the glenoid suggesting metastatic disease. IMPRESSION: Moderate opacification over the right lung compatible with moderate size effusion likely with basilar atelectasis without significant change from recent CT. Patient's known medial right upper lobe lung cancer is not well visualized. Mottled sclerosis over the left scapula/glenoid suggesting metastatic disease. Electronically Signed   By: Marin Olp M.D.   On: 09/22/2018 12:19   Vas [place] Upper Extremity Venous Duplex  Result Date: 09/29/2018 UPPER VENOUS STUDY  Indications: Pain, Swelling, and Recent IV LUE Performing Technologist: Maudry Mayhew MHA, RDMS, RVT, RDCS  Examination Guidelines: A complete evaluation includes B-mode imaging, spectral  Doppler, color Doppler, and power Doppler as needed of all accessible portions of each vessel. Bilateral testing is considered an integral part of a complete examination. Limited examinations for reoccurring indications may be performed as noted.  Right Findings: +----------+------------+---------+-----------+----------+-------+  RIGHT      Compressible Phasicity Spontaneous Properties Summary  +----------+------------+---------+-----------+----------+-------+  Subclavian                 Yes        Yes                         +----------+------------+---------+-----------+----------+-------+  Left Findings: +----------+------------+---------+-----------+----------+--------------+  LEFT       Compressible Phasicity Spontaneous Properties    Summary      +----------+------------+---------+-----------+----------+--------------+  IJV            None                   No  Acute       +----------+------------+---------+-----------+----------+--------------+  Subclavian     None                   No                     Acute       +----------+------------+---------+-----------+----------+--------------+  Axillary       None                   No                     Acute       +----------+------------+---------+-----------+----------+--------------+  Brachial       None                   No                     Acute       +----------+------------+---------+-----------+----------+--------------+  Radial         Full                                                      +----------+------------+---------+-----------+----------+--------------+  Ulnar          Full                                                      +----------+------------+---------+-----------+----------+--------------+  Cephalic       Full                                                      +----------+------------+---------+-----------+----------+--------------+  Basilic                                                  Not visualized   +----------+------------+---------+-----------+----------+--------------+  Summary:  Right: No evidence of thrombosis in the subclavian.  Left: Findings consistent with acute deep vein thrombosis involving the left internal jugular veins, left subclavian veins, left axillary vein and left brachial veins.  *See table(s) above for measurements and observations.  Diagnosing physician: Monica Martinez MD Electronically signed by Monica Martinez MD on 09/29/2018 at 5:13:00 PM.    Final    Dg Esophagus W Single Cm (sol Or Thin Ba)  Result Date: 09/24/2018 CLINICAL DATA:  Dysphagia, with feeling of food getting stuck in upper esophagus. Recently diagnosed with metastatic lung cancer. EXAM: ESOPHOGRAM/BARIUM SWALLOW TECHNIQUE: Single contrast examination was performed using  thin barium. FLUOROSCOPY TIME:  Fluoroscopy Time:  2 minutes and 48 seconds Radiation Exposure Index (if provided by the fluoroscopic device): 50.1 mGy Number of Acquired Spot Images: 0 COMPARISON:  CT of 09/09/2018. FINDINGS: Exam was performed using a single-contrast technique, with the patient in an RAO position secondary to relative immobility and discomfort. Evaluation of esophageal peristalsis demonstrates an incomplete peristaltic wave  with contrast stasis in the upper esophagus. Example on series 1 and 2. There are tertiary contractions with eventual passage of contrast in the lower esophagus. Full column evaluation of the esophagus demonstrates an approximately 3 cm segment of relatively mild esophageal underdistention, suspicious for mass-effect from thoracic adenopathy. This is identified at and just inferior to the carina, including on image 197/4. Also image 95/3. IMPRESSION: 1. 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 2. Mild limitations, as detailed above. 3. Mid esophageal underdistention, likely due to relatively mild mass effect from mediastinal adenopathy. Electronically Signed   By: Abigail Miyamoto  M.D.   On: 09/24/2018 15:14   US Thoracentesis Asp Pleural Space W/img Guide  Result Date: 09/22/2018 INDICATION: Patient with history of stage IV lung cancer, dyspnea, large right pleural effusion; request received for therapeutic right thoracentesis. EXAM: ULTRASOUND GUIDED THERAPEUTIC RIGHT THORACENTESIS MEDICATIONS: None COMPLICATIONS: None immediate. PROCEDURE: An ultrasound guided thoracentesis was thoroughly discussed with the patient and questions answered. The benefits, risks, alternatives and complications were also discussed. The patient understands and wishes to proceed with the procedure. Written consent was obtained. Ultrasound was performed to localize and mark an adequate pocket of fluid in the right chest. The area was then prepped and draped in the normal sterile fashion. 1% Lidocaine was used for local anesthesia. Under ultrasound guidance a 6 Fr Safe-T-Centesis catheter was introduced. Thoracentesis was performed. The catheter was removed and a dressing applied. FINDINGS: A total of approximately 1.9 liters of yellow fluid was removed. IMPRESSION: Successful ultrasound guided therapeutic right thoracentesis yielding 1.9 liters of pleural fluid. Follow-up chest x-ray revealed approximately 50-60% pneumothorax, most likely ex vacuo in origin with failed reexpansion of the lung due to tumor. Above findings discussed with Drs. Yamagata/Mohamed and patient's nurse. Patient currently stable. If symptoms worsen can obtain additional follow-up chest x-ray. Read by: Rowe Robert, PA-C Electronically Signed   By: Aletta Edouard M.D.   On: 09/22/2018 16:00    Lab Data:  CBC: Recent Labs  Lab 10/12/18 2218 10/13/18 0449 10/14/18 0331  WBC 10.0 9.0 9.3  NEUTROABS 7.6  --   --   HGB 11.2* 11.2* 10.9*  HCT 35.0* 34.9* 34.7*  MCV 81.0 81.0 82.2  PLT 183 182 850   Basic Metabolic Panel: Recent Labs  Lab 10/12/18 2218 10/13/18 0449 10/14/18 0331  NA 128* 128* 130*  K 3.8 4.1 4.3  CL  88* 88* 90*  CO2 _0 GLUCOSE 97 107* 97  BUN _1 CREATININE 0.49 0.47 0.52  CALCIUM 9.0 9.1 8.9   GFR: Estimated Creatinine Clearance: 72.6 mL/min (by C-G formula based on SCr of 0.52 mg/dL). Liver Function Tests: Recent Labs  Lab 10/12/18 2218 10/14/18 0331  AST 22 25  ALT 18 19  ALKPHOS 184* 174*  BILITOT 0.9 0.9  PROT 7.0 6.5  ALBUMIN 2.8* 2.7*   No results for input(s): LIPASE, AMYLASE in the last 168 hours. No results for input(s): AMMONIA in the last 168 hours. Coagulation Profile: Recent Labs  Lab 10/13/18 1131 10/14/18 0655  INR 3.4* 3.2*   Cardiac Enzymes: Recent Labs  Lab 10/12/18 2218  TROPONINI <0.03   BNP (last 3 results) No results for input(s): PROBNP in the last 8760 hours. HbA1C: No results for input(s): HGBA1C in the last 72 hours. CBG: No results for input(s): GLUCAP in the last 168 hours. Lipid Profile: No results for input(s): CHOL, HDL, LDLCALC, TRIG, CHOLHDL, LDLDIRECT in the last 72 hours. Thyroid  Function Tests: No results for input(s): TSH, T4TOTAL, FREET4, T3FREE, THYROIDAB in the last 72 hours. Anemia Panel: Recent Labs    10/13/18 0449  VITAMINB12 4,340*  FOLATE 6.2  FERRITIN 618*  TIBC 181*  IRON 31  RETICCTPCT 0.8   Urine analysis: No results found for: COLORURINE, APPEARANCEUR, LABSPEC, PHURINE, GLUCOSEU, HGBUR, BILIRUBINUR, KETONESUR, PROTEINUR, UROBILINOGEN, NITRITE, LEUKOCYTESUR   Keziah Avis M.D. [facility] 10/14/2018, 1:13 PM  Pager: [phone] Between 7am to 7pm - call Pager - [phone]  After 7pm go to [email or web address] - password [email or web address]  Call night coverage person covering after 7pm

## 2018-10-14 NOTE — Progress Notes (Signed)
  Radiation Oncology         [phone] ________________________________  Name: Mariah Diaz MRN: [id number]  Date: 09/29/2018  DOB: 1961-01-08  Simulation Verification Note    ICD-10-CM   1. Brain metastases (HCC) C79.31 LORazepam (ATIVAN) tablet 0.5 mg  2. Primary malignant neoplasm of lung metastatic to other site, unspecified laterality ([place]) C34.90   3. Metastatic cancer to spine [facility]) C79.51     Status: outpatient  NARRATIVE: The patient was brought to the treatment unit and placed in the planned treatment position. The clinical setup was verified. Then port films were obtained and uploaded to the radiation oncology medical record software.  The treatment beams were carefully compared against the planned radiation fields. The position location and shape of the radiation fields was reviewed. They targeted volume of tissue appears to be appropriately covered by the radiation beams. Organs at risk appear to be excluded as planned.  Based on my personal review, I approved the simulation verification. The patient's treatment will proceed as planned.  -----------------------------------  Blair Promise, PhD, MD

## 2018-10-15 ENCOUNTER — Encounter (HOSPITAL_COMMUNITY): Payer: Self-pay | Admitting: Interventional Radiology

## 2018-10-15 ENCOUNTER — Other Ambulatory Visit: Payer: Self-pay

## 2018-10-15 ENCOUNTER — Encounter: Payer: Self-pay | Admitting: Radiation Oncology

## 2018-10-15 ENCOUNTER — Inpatient Hospital Stay (HOSPITAL_COMMUNITY): Payer: Managed Care, Other (non HMO)

## 2018-10-15 DIAGNOSIS — Z7189 Other specified counseling: Secondary | ICD-10-CM

## 2018-10-15 DIAGNOSIS — Z515 Encounter for palliative care: Secondary | ICD-10-CM

## 2018-10-15 DIAGNOSIS — I4891 Unspecified atrial fibrillation: Secondary | ICD-10-CM | POA: Diagnosis not present

## 2018-10-15 DIAGNOSIS — J910 Malignant pleural effusion: Secondary | ICD-10-CM

## 2018-10-15 DIAGNOSIS — R0602 Shortness of breath: Secondary | ICD-10-CM

## 2018-10-15 DIAGNOSIS — J9611 Chronic respiratory failure with hypoxia: Secondary | ICD-10-CM

## 2018-10-15 DIAGNOSIS — Z66 Do not resuscitate: Secondary | ICD-10-CM

## 2018-10-15 HISTORY — PX: IR PERC PLEURAL DRAIN W/INDWELL CATH W/IMG GUIDE: IMG5383

## 2018-10-15 LAB — CBC
HCT: 34.1 % — ABNORMAL LOW (ref 36.0–46.0)
Hemoglobin: 10.6 g/dL — ABNORMAL LOW (ref 12.0–15.0)
MCH: 25.3 pg — ABNORMAL LOW (ref 26.0–34.0)
MCHC: 31.1 g/dL (ref 30.0–36.0)
MCV: 81.4 fL (ref 80.0–100.0)
Platelets: 153 10*3/uL (ref 150–400)
RBC: 4.19 MIL/uL (ref 3.87–5.11)
RDW: 13.9 % (ref 11.5–15.5)
WBC: 9.1 10*3/uL (ref 4.0–10.5)
nRBC: 0 % (ref 0.0–0.2)

## 2018-10-15 LAB — PROTIME-INR
INR: 3.3 — ABNORMAL HIGH (ref 0.8–1.2)
Prothrombin Time: 32.6 seconds — ABNORMAL HIGH (ref 11.4–15.2)

## 2018-10-15 MED ORDER — LORAZEPAM 0.5 MG PO TABS
0.5000 mg | ORAL_TABLET | Freq: Four times a day (QID) | ORAL | Status: DC | PRN
  Administered 2018-10-15: 0.5 mg via ORAL
  Filled 2018-10-15: qty 1

## 2018-10-15 MED ORDER — CEFAZOLIN SODIUM-DEXTROSE 2-4 GM/100ML-% IV SOLN
INTRAVENOUS | Status: AC
  Administered 2018-10-15: 2 g via INTRAVENOUS
  Filled 2018-10-15: qty 100

## 2018-10-15 MED ORDER — FENTANYL CITRATE (PF) 100 MCG/2ML IJ SOLN
INTRAMUSCULAR | Status: AC | PRN
  Administered 2018-10-15: 25 ug via INTRAVENOUS

## 2018-10-15 MED ORDER — METOPROLOL TARTRATE 5 MG/5ML IV SOLN
2.5000 mg | INTRAVENOUS | Status: DC | PRN
  Administered 2018-10-15: 2.5 mg via INTRAVENOUS
  Filled 2018-10-15: qty 5

## 2018-10-15 MED ORDER — LIDOCAINE HCL (PF) 1 % IJ SOLN
INTRAMUSCULAR | Status: AC | PRN
  Administered 2018-10-15: 10 mL

## 2018-10-15 MED ORDER — FENTANYL 12 MCG/HR TD PT72
1.0000 | MEDICATED_PATCH | TRANSDERMAL | Status: DC
  Administered 2018-10-15: 1 via TRANSDERMAL
  Filled 2018-10-15: qty 1

## 2018-10-15 MED ORDER — MIDAZOLAM HCL 2 MG/2ML IJ SOLN
INTRAMUSCULAR | Status: AC | PRN
  Administered 2018-10-15: 1 mg via INTRAVENOUS

## 2018-10-15 MED ORDER — ONDANSETRON 4 MG PO TBDP: 4.0000 mg | ORAL_TABLET | Freq: Three times a day (TID) | ORAL | Status: DC | PRN

## 2018-10-15 MED ORDER — FENTANYL CITRATE (PF) 100 MCG/2ML IJ SOLN
INTRAMUSCULAR | Status: AC
  Filled 2018-10-15: qty 2

## 2018-10-15 MED ORDER — CEFAZOLIN SODIUM-DEXTROSE 2-4 GM/100ML-% IV SOLN
2.0000 g | INTRAVENOUS | Status: AC
  Administered 2018-10-15: 2 g via INTRAVENOUS

## 2018-10-15 MED ORDER — MIDAZOLAM HCL 2 MG/2ML IJ SOLN
INTRAMUSCULAR | Status: AC
  Filled 2018-10-15: qty 2

## 2018-10-15 MED ORDER — ONDANSETRON 4 MG PO TBDP: 4.0000 mg | ORAL_TABLET | Freq: Four times a day (QID) | ORAL | Status: DC | PRN

## 2018-10-15 MED ORDER — LIDOCAINE HCL 1 % IJ SOLN
INTRAMUSCULAR | Status: AC
  Filled 2018-10-15: qty 20

## 2018-10-15 MED ORDER — APIXABAN 5 MG PO TABS
5.0000 mg | ORAL_TABLET | Freq: Two times a day (BID) | ORAL | Status: DC
  Administered 2018-10-15 – 2018-10-16 (×2): 5 mg via ORAL
  Filled 2018-10-15 (×2): qty 1

## 2018-10-15 MED ORDER — MORPHINE SULFATE (CONCENTRATE) 10 MG/0.5ML PO SOLN
5.0000 mg | ORAL | Status: DC | PRN
  Administered 2018-10-15 (×3): 5 mg via ORAL
  Filled 2018-10-15 (×3): qty 0.5

## 2018-10-15 MED ORDER — PHYTONADIONE 5 MG PO TABS
5.0000 mg | ORAL_TABLET | Freq: Once | ORAL | Status: DC
  Filled 2018-10-15: qty 1

## 2018-10-15 MED ORDER — MORPHINE SULFATE (PF) 2 MG/ML IV SOLN
2.0000 mg | Freq: Four times a day (QID) | INTRAVENOUS | Status: DC | PRN
  Administered 2018-10-16: 2 mg via INTRAVENOUS
  Filled 2018-10-15: qty 1

## 2018-10-15 MED ORDER — MENTHOL 3 MG MT LOZG
1.0000 | LOZENGE | OROMUCOSAL | Status: DC | PRN
  Filled 2018-10-15: qty 9

## 2018-10-15 NOTE — Progress Notes (Signed)
[facility] PROGRESS NOTE  Mariah Diaz WOE:[id number] DOB: [birth year] DOA: 10/12/2018 PCP: Orpah Melter, MD  Assessment/Plan: New onset atrial fibrillation with RVR. No documented history. EKG confirms with hr in 160s and asymptomatic. Attempt rate control with IV metoprolol. May need to start diltiazem infusion if does not respond. Home eliquis has been resumed for left upper extremity DVT  Chronic hypoxic respiratory failure secondary to Recurrent malignant right pleural effusion. s/p palliative pleur-x-placement by IR with 2 L drained. Follow up repeat CXR. 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  Appreciate palliative medicine recommendations for pain management (  Chronically elevated INR in setting of hepatic metastasis. Gave vitamin K for some reversal prior to procedure. Closely monitor, asymptomatic  LUE DVT. Resume home eliquis now that pleur-x placed  Chronic hyponatremia, stable.  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  Continue salt tabs and fluid restriction.   Code Status: DNR Family Communication: No family at bedside (indicate person spoken with, relationship, and if by phone, the number) Disposition Plan: Monitor pain control, monitor RVR for new onset atrial fibrillation, anticipate discharge next 24 to 48 hours.   Consultants:  Palliative  Procedures:  Pleurx catheter placement, 4/29  Antibiotics:  none  HPI/Subjective:  Mariah Diaz is a 58 y.o. year old female with medical history significant for stage IV metastatic non-small cell lung cancer with mets to liver, brain, and bone currently on palliative radiation, hypertension, DVT, right-sided hydropneumothorax who presented on 10/12/2018 with worsening shortness of breath 2 days and dry cough and was found to have recurrent malignant pleural effusion in setting of metastatic non-small cell lung cancer  Still has a sore  throat Stable breathing since Pleurx catheter placed No cough   Objective: Vitals:   10/15/18 1206 10/15/18 1251  BP:  117/88  Pulse:  (!) 107  Resp:  18  Temp:  98.2 F (36.8 C)  SpO2: 96% 99%    Intake/Output Summary (Last 24 hours) at 10/15/2018 1322 Last data filed at 10/15/2018 1252 Gross per 24 hour  Intake 120 ml  Output -  Net 120 ml   Filed Weights   10/12/18 2136 10/13/18 0430  Weight: 70.3 kg 70 kg    Exam:   General: Female who looks older than stated age, no acute distress  Cardiovascular: Irregularly irregular rhythm, tachycardic, no peripheral edema  Respiratory: Normal respiratory effort on 3 L, diminished breath sounds on right side  Abdomen: Soft, nondistended, nontender, normal bowel sounds  Musculoskeletal: Normal range of motion  Skin no rashes or lesions  Neurologic no appreciable focal deficits  Data Reviewed: Basic Metabolic Panel: Recent Labs  Lab 10/12/18 2218 10/13/18 0449 10/14/18 0331  NA 128* 128* 130*  K 3.8 4.1 4.3  CL 88* 88* 90*  CO2 26 27 26   GLUCOSE 97 107* 97  BUN 7 8 8   CREATININE 0.49 0.47 0.52  CALCIUM 9.0 9.1 8.9   Liver Function Tests: Recent Labs  Lab 10/12/18 2218 10/14/18 0331  AST 22 25  ALT 18 19  ALKPHOS 184* 174*  BILITOT 0.9 0.9  PROT 7.0 6.5  ALBUMIN 2.8* 2.7*   No results for input(s): LIPASE, AMYLASE in the last 168 hours. No results for input(s): AMMONIA in the last 168 hours. CBC: Recent Labs  Lab 10/12/18 2218 10/13/18 0449 10/14/18 0331 10/15/18 0447  WBC 10.0 9.0 9.3 9.1  NEUTROABS 7.6  --   --   --  HGB 11.2* 11.2* 10.9* 10.6*  HCT 35.0* 34.9* 34.7* 34.1*  MCV 81.0 81.0 82.2 81.4  PLT 183 182 173 153   Cardiac Enzymes: Recent Labs  Lab 10/12/18 2218  TROPONINI <0.03   BNP (last 3 results) No results for input(s): BNP in the last 8760 hours.  ProBNP (last 3 results) No results for input(s): PROBNP in the last 8760 hours.  CBG: No results for input(s): GLUCAP  in the last 168 hours.  Recent Results (from the past 240 hour(s))  SARS Coronavirus 2 [facility] order, Performed in [facility] hospital lab)     Status: None   Collection Time: 10/12/18 10:45 PM  Result Value Ref Range Status   SARS Coronavirus 2 NEGATIVE NEGATIVE Final    Comment: (NOTE) If result is NEGATIVE SARS-CoV-2 target nucleic acids are NOT DETECTED. The SARS-CoV-2 RNA is generally detectable in upper and lower  respiratory specimens during the acute phase of infection. The lowest  concentration of SARS-CoV-2 viral copies this assay can detect is 250  copies / mL. A negative result does not preclude SARS-CoV-2 infection  and should not be used as the sole basis for treatment or other  patient management decisions.  A negative result may occur with  improper specimen collection / handling, submission of specimen other  than nasopharyngeal swab, presence of viral mutation(s) within the  areas targeted by this assay, and inadequate number of viral copies  (<250 copies / mL). A negative result must be combined with clinical  observations, patient history, and epidemiological information. If result is POSITIVE SARS-CoV-2 target nucleic acids are DETECTED. The SARS-CoV-2 RNA is generally detectable in upper and lower  respiratory specimens dur ing the acute phase of infection.  Positive  results are indicative of active infection with SARS-CoV-2.  Clinical  correlation with patient history and other diagnostic information is  necessary to determine patient infection status.  Positive results do  not rule out bacterial infection or co-infection with other viruses. If result is PRESUMPTIVE POSTIVE SARS-CoV-2 nucleic acids MAY BE PRESENT.   A presumptive positive result was obtained on the submitted specimen  and confirmed on repeat testing.  While 2019 novel coronavirus  (SARS-CoV-2) nucleic acids may be present in the submitted sample  additional confirmatory testing may be  necessary for epidemiological  and / or clinical management purposes  to differentiate between  SARS-CoV-2 and other Sarbecovirus currently known to infect humans.  If clinically indicated additional testing with an alternate test  methodology [phone]) is advised. The SARS-CoV-2 RNA is generally  detectable in upper and lower respiratory sp ecimens during the acute  phase of infection. The expected result is Negative. Fact Sheet for Patients:  StrictlyIdeas.no Fact Sheet for Healthcare Providers: BankingDealers.co.za This test is not yet approved or cleared by the [place] FDA and has been authorized for detection and/or diagnosis of SARS-CoV-2 by FDA under an Emergency Use Authorization (EUA).  This EUA will remain in effect (meaning this test can be used) for the duration of the COVID-19 declaration under Section 564(b)(1) of the Act, 21 U.S.C. section 360bbb-3(b)(1), unless the authorization is terminated or revoked sooner. Performed at [facility], [place] [address] [place], [place] [address]      Studies: No results found.  Scheduled Meds: . fentaNYL  1 patch Transdermal Q72H  . sodium chloride  1 g Oral TID WC   Continuous Infusions: .  ceFAZolin (ANCEF) IV      Principal Problem:   Pleural effusion  Active Problems:   Chronic hyponatremia   Metastatic lung cancer (metastasis from lung to other site) [facility])   Chronic anemia   History of DVT (deep vein thrombosis)      Desiree Hane  [facility]

## 2018-10-15 NOTE — Evaluation (Signed)
Clinical/Bedside Swallow Evaluation Patient Details  Name: Mariah Diaz MRN: [id number] Date of Birth: June 28, 1960  Today's Date: 10/15/2018 Time: SLP Start Time (ACUTE ONLY): 5784 SLP Stop Time (ACUTE ONLY): 1745 SLP Time Calculation (min) (ACUTE ONLY): 20 min  Past Medical History:  Past Medical History:  Diagnosis Date  . Depression   . Heart murmur    has not had in checked in "years"  . Hypertension   . No pertinent past medical history    Past Surgical History:  Past Surgical History:  Procedure Laterality Date  . ANTERIOR CERVICAL CORPECTOMY N/A 08/29/2018   Procedure: Cervical 3 Corpectomy with Cervical 2 to Cervical 4 plate;  Surgeon: Erline Levine, MD;  Location: [place];  Service: Neurosurgery;  Laterality: N/A;  Cervical 3 Corpectomy with Cervical 2 to Cervical 4 plate  . IR PERC PLEURAL DRAIN W/INDWELL CATH W/IMG GUIDE  10/15/2018  . KNEE ARTHROSCOPY WITH ANTERIOR CRUCIATE LIGAMENT (ACL) REPAIR  04/22/2012   Procedure: KNEE ARTHROSCOPY WITH ANTERIOR CRUCIATE LIGAMENT (ACL) REPAIR;  Surgeon: Hessie Dibble, MD;  Location: [place];  Service: Orthopedics;  Laterality: Right;  . TONSILLECTOMY     HPI:  58 yo female adm to [facility] with decreased right lung ventilation - found to have malignant pleural effusion/compressive ATX s/p pleurx drain placed today, underwent thoracentesis 09/22/2018.  She is on 3 liters of oxygen at home.  Home hospice is planned for pt per chart review.  She has metastatic lung cancer with liver, brain, bone mets and is undergoing palliative XRT.  Pt also has h/o LUE DVT.  She has had corpectomy C2- and ACDF from C2-C4.  Swallow evaluation ordered.  Per notes from palliative care, pt has had difficulty with swallowing pills and does not desire to eat/drink.   Assessment / Plan / Recommendation Clinical Impression  Patient presents with clinical indications concerning for possible RLN involvement - c/b severe dysphonia.  She also reports  throat pain - level 8/10 - RN aware and advised pt she would reach out to MD.  Pt reports difficulty swallowing and admits she "eats to live" at this point.  No focal CN deficits x vagus nerve concerns.  Pt consumed only a few boluses of water, Boost Breeze, yogurt and jello.  Overt immediate and prolonged coughing noted post-swallow of thin water via bottle.  This increased pt's HR *per RN call from tele* and obviously caused her severe discomfort.  Decreasing bolus amount to tsp was tolerated without overt indication of aspiration and improved pt's comfort level.  Educated pt to concern for possible vagal nerve involvement contributing to her dysphagia/aspiration and reviewed mitigation strategies.  Given RLN provides sensation to larynx below vocal folds, cervical esophagus and trachea - suspect she may also have silent aspiration.  Thus if pt is coughing with intake - she is likely experiencing larger amount of aspiration.  Given posterior cricoarytenoids open vocal folds and are innervated by RLN- subglottic airway pressure is likely impaired.  Pt did not take her pill due to concern for "coughing, choking".  Recommend all medications that can be changed to suspension or patch be modified to such administration for pt comfort.  Advised pt to SLP goal of aspiration mitigation and increasing comfort with intake- to which pt offered understanding.  She states solids make her gag and liquids make her choke.  She tolerated thin via tsp, jello and yogurt without overt difficulties.  Recommend diet as pt tolerates as she is aware of foods she  manages.  Note plan for pt to go home with hospice and do not recommend instrumental swallow evaluation as would not change her outcomes.  Will follow up x1 more to assure education completed to help maximize pt's comfort.  Thanks for this consult.    SLP Visit Diagnosis: Dysphagia, unspecified (R13.10)    Aspiration Risk    high   Diet Recommendation   as tolerated        Other  Recommendations   none  Follow up Recommendations None      Frequency and Duration   x1         Prognosis   good for goals of mitigation strategies     Swallow Study   General Date of Onset: 10/15/18 HPI: 58 yo female adm to [facility] with decreased right lung ventilation - found to have malignant pleural effusion/compressive ATX s/p pleurx drain placed today, underwent thoracentesis 09/22/2018.  She is on 3 liters of oxygen at home.  Home hospice is planned for pt per chart review.  She has metastatic lung cancer with liver, brain, bone mets and is undergoing palliative XRT.  Pt also has h/o LUE DVT.  She has had corpectomy C2- and ACDF from C2-C4.  Swallow evaluation ordered.  Per notes from palliative care, pt has had difficulty with swallowing pills and does not desire to eat/drink. Type of Study: Bedside Swallow Evaluation Previous Swallow Assessment: esophagram 09/2018 showed early presbyesophagus, possible mass effect from thoracic adenopathy Diet Prior to this Study: Regular;Thin liquids Temperature Spikes Noted: No Respiratory Status: Nasal cannula History of Recent Intubation: No Behavior/Cognition: Alert;Cooperative;Pleasant mood Oral Cavity Assessment: Within Functional Limits Oral Care Completed by SLP: No Oral Cavity - Dentition: Adequate natural dentition Vision: Functional for self-feeding Self-Feeding Abilities: Able to feed self Patient Positioning: Upright in bed Baseline Vocal Quality: Suspected CN X (Vagus) involvement;Breathy Volitional Cough: Other (Comment);Weak(mostly nonproductive)    Oral/Motor/Sensory Function Overall Oral Motor/Sensory Function: Within functional limits   Ice Chips Ice chips: Within functional limits   Thin Liquid Thin Liquid: Impaired Presentation: Cup;Self Fed;Spoon Pharyngeal  Phase Impairments: Cough - Immediate;Multiple swallows;Other (comments) Other Comments: decreasing to tsp amount of liquids prevented overt indication of  aspiration    Nectar Thick Nectar Thick Liquid: Impaired Pharyngeal Phase Impairments: Multiple swallows Other Comments: pt c/o difficulty swallowing   Honey Thick Honey Thick Liquid: Not tested   Puree Puree: Within functional limits Presentation: Self Fed;Spoon   Solid     Solid: Not tested      Macario Golds 10/15/2018,6:20 PM  Luanna Salk, [place] [facility] SLP [place] Pager [phone] Office [phone]

## 2018-10-15 NOTE — Progress Notes (Signed)
Pt agreed with [facility] will follow pt for Palliative Care and [place] at home for HHPT/NA.

## 2018-10-15 NOTE — Progress Notes (Signed)
1930 Was notified by IR MD Marlena Clipper in regards to patient's CXR results that are positive for pneumothorax. Patient resting quietly at this time without complaints. 57 Rhonda with radiology called back for on call number to speak with directly, gave page for [place].

## 2018-10-15 NOTE — Progress Notes (Signed)
  Radiation Oncology         [phone] ________________________________  Name: Mariah Diaz MRN: [id number]  Date: 10/15/2018  DOB: 1960-12-19  End of Treatment Note  Diagnosis:   Stage IV Non-small celllung cancerwith spine, brain, andliver metastases     Indication for treatment:  Palliative       Radiation treatment dates:   09/29/18-10/10/18  Site/dose:   1. Brain; 10 fractions of 3 Gy for a total of 30 Gy          2. Right lung; 10 fractions of 3 Gy for a total of 30 Gy          3. Thoracic spine; 10 fractions of 3 Gy for a total of 30 Gy   Beams/energy:   1. Photon Isodose        2. Photon 3D       3. Photon Isodose  Narrative: The patient tolerated radiation treatment relatively well.     At the beginning of treatment, pt was asymptomatic. Towards the end of treatment, pt reported moderate fatigue and a decrease in back pain, which pain medication was managing well. Overall the pt was without complaints.   Plan: The patient has completed radiation treatment. The patient will return to radiation oncology clinic for routine followup in one month. I advised them to call or return sooner if they have any questions or concerns related to their recovery or treatment.  -----------------------------------  Blair Promise, PhD, MD  This document serves as a record of services personally performed by Gery Pray, MD. It was created on his behalf by Mary-Margaret Loma Messing, a trained medical scribe. The creation of this record is based on the scribe's personal observations and the provider's statements to them. This document has been checked and approved by the attending provider.

## 2018-10-15 NOTE — Procedures (Signed)
Interventional Radiology Procedure Note  Procedure: Placement of a right sided tunneled pleural drainage catheter.   Complications: None  Estimated Blood Loss: None  Recommendations: - Return to floor.    Signed,  Criselda Peaches, MD

## 2018-10-15 NOTE — Consult Note (Signed)
Consultation Note Date: 10/15/2018   Patient Name: Mariah Diaz  DOB: 1961/02/24  MRN: [id number]  Age / Sex: 58 y.o., female   PCP: Orpah Melter, MD Referring Physician: Desiree Hane, MD   REASON FOR CONSULTATION:Establishing goals of care  Palliative Care consult requested for this 58 y.o. female with multiple medical problems including hypertension, DVT, stage IV metastatic non-small cell lung cancer with mets to liver, brain, and bone. She was admitted with complaints of worsening shortness of breath and dry cough. X-ray showed recurrent right pleural effusion. She is scheduled for Pleurx catheter placement via IR.   Clinical Assessment and Goals of Care: I have reviewed medical records including lab results, imaging, Epic notes, and MAR, received report from the bedside RN, and assessed the patient. I met at the bedside with Mariah Diaz to discuss diagnosis prognosis, [place], EOL wishes, disposition and options. She is awake, alert, and oriented x3. She endorses shortness of breath and back pain. Reports pain is somewhat resolved with IV morphine.   I introduced Palliative Medicine as specialized medical care for people living with serious illness. It focuses on providing relief from the symptoms and stress of a serious illness. The goal is to improve quality of life for both the patient and the family.  We discussed a brief life review of the patient, along with her overall functional and nutritional status. Mariah Diaz reports prior to admission she was living by herself. She has never married and has no children. She reports her friends and family are her main support system. She reports she has 2 cats which she is waiting to be re-homed and her dog who she recently gave to a neighbor in her preparation for end-of-life. 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   She reports she is on  3L/[place] at home and uses a walker for mobility assistance. She reports about 2-3 weeks ago she began having difficulty swallowing and now is unable to swallow pills without choking and dry heaving for minutes to hours afterwards. She endorses weight loss, as she is unsure of the total amount but knows it is at least 50-60lbs. She reports she is only eating maybe 1 small meal a day if that and drinks about 6-8 oz of room temperature water a day. I discussed her severe malnutrition and the need for nutritional support to sustain life. She reports she has no appetite, taste, or urge to increase her po intake. Offered nutritional support and appetite stimulant however she declined.   We discussed Her current illness and what it means in the larger context of Her on-going co-morbidities. With specific discussions regarding her metastatic cancer. Natural disease trajectory and expectations at EOL were discussed. Patient is tearful and states "I know that I will die soon. I just want to be home and comfortable" Support given. She reports the past 3 months have been awful and she has rapidly declined. She states she is not interested in any forms of chemotherapy at this point.   I attempted to elicit values and goals of care important to the patient.    The difference between aggressive medical intervention and comfort care was considered in light of the patient's goals of care. I educated patient on what comfort care measures would look like. I discussed with her with comfort care she would no longer receive aggressive medical interventions such as lab  work, radiology testing, or medications not focused on comfort does not have to be a priority. All care will focus on how the patient is feeling and supporting her current quality of life. This will include management of any symptoms that may cause discomfort, pain, shortness of breath, cough, nausea, agitation, anxiety, and/or secretions etc. Symptoms would be managed  with medications and other non-pharmacological interventions such as spiritual support if requested, repositioning, music therapy, or therapeutic listening. She verbalized understanding and appreciation.   [name] states that is the care she would like to receive in her home. She is concerned about her increase in pain and not being able to safely take pills and the anxiety. We discussed a medication regimen plan which would include a fentanyl patch and breakthrough pain medication in solution form. She verbalized understanding and expressed she is hopeful this regimen would provide some relief.   She has an advanced directives. She confirms her HCPOA is her friend Mariah Diaz with support from her brother Mariah Diaz. She also confirmed wishes for DNR/DNI and no artificial feedings.    Hospice and Palliative Care services outpatient were explained and offered. Patient and family verbalized her understanding and awareness of both palliative and hospice's goals and philosophy of care. She reports at this time she would like to have hospice support. She inquired about residential hospice also. Explained criteria and patient is aware once she is home and condition worsens she may be considered for the facility in the event those are her wishes. She verbalized understanding and expressed she plans to talk with her family and friends and determine boundaries once home regarding how to proceed as time draws nearer. Support given.   Questions and concerns were addressed.  Hard Choices booklet left for review. The family was encouraged to call with questions or concerns.  PMT will continue to support holistically.  SOCIAL HISTORY:     reports that she quit smoking about 16 years ago. She has never used smokeless tobacco. She reports current alcohol use of about 21.0 standard drinks of alcohol per week. She reports that she does not use drugs.  CODE STATUS: DNR  ADVANCE DIRECTIVES: Primary Decision Maker:  Patient in the event patient unable to make decisions she expressed her friend Mariah Diaz and brother, Mariah Diaz are her designated Garment/textile technologist. HCPOA: YES    SYMPTOM MANAGEMENT:  Fentanyl 12.5 mcg patch   Roxanol 5 mg every 2hrs PRN for breakthrough  Zofran 20m SL for nausea  Ativan 0.5 mg every 6hrs PRN for anxiety   Palliative Prophylaxis:   Aspiration, Bowel Regimen and Frequent Pain Assessment  PSYCHO-SOCIAL/SPIRITUAL:  Support System: Friends/Family   Desire for further Chaplaincy support:NO   Additional Recommendations (Limitations, Scope, Preferences):  Minimize Medications, No Artificial Feeding and No Chemotherapy, continue to treat with escalation    PAST MEDICAL HISTORY: Past Medical History:  Diagnosis Date  . Depression   . Heart murmur    has not had in checked in "years"  . Hypertension   . No pertinent past medical history     PAST SURGICAL HISTORY:  Past Surgical History:  Procedure Laterality Date  . ANTERIOR CERVICAL CORPECTOMY N/A 08/29/2018   Procedure: Cervical 3 Corpectomy with Cervical 2 to Cervical 4 plate;  Surgeon: SErline Levine MD;  Location[facility]  Service: Neurosurgery;  Laterality: N/A;  Cervical 3 Corpectomy with Cervical 2 to Cervical 4 plate  . KNEE ARTHROSCOPY WITH ANTERIOR CRUCIATE LIGAMENT (ACL) REPAIR  04/22/2012   Procedure: KNEE  ARTHROSCOPY WITH ANTERIOR CRUCIATE LIGAMENT (ACL) REPAIR;  Surgeon: Hessie Dibble, MD;  Location: [facility];  Service: Orthopedics;  Laterality: Right;  . TONSILLECTOMY      ALLERGIES:  has No Known Allergies.   MEDICATIONS:  Current Facility-Administered Medications  Medication Dose Route Frequency Provider Last Rate Last Dose  . acetaminophen (TYLENOL) tablet 650 mg  650 mg Oral Q6H PRN Shela Leff, MD       Or  . acetaminophen (TYLENOL) suppository 650 mg  650 mg Rectal Q6H PRN Shela Leff, MD      . benzonatate (TESSALON) capsule 200 mg  200 mg Oral TID PRN  Shela Leff, MD      . HYDROcodone-acetaminophen (NORCO/VICODIN) 5-325 MG per tablet 1-2 tablet  1-2 tablet Oral Q6H PRN Shela Leff, MD   2 tablet at 10/13/18 0650  . LORazepam (ATIVAN) tablet 0.5 mg  0.5 mg Oral Q8H PRN Shela Leff, MD      . meclizine (ANTIVERT) tablet 25 mg  25 mg Oral TID PRN Shela Leff, MD      . morphine 2 MG/ML injection 2 mg  2 mg Intravenous Q4H PRN Shela Leff, MD   2 mg at 10/15/18 0354  . oxyCODONE (OXYCONTIN) 12 hr tablet 10 mg  10 mg Oral Q12H Rai, Ripudeep K, MD      . phenol (CHLORASEPTIC) mouth spray 1 spray  1 spray Mouth/Throat PRN Rai, Ripudeep K, MD   1 spray at 10/14/18 0455  . sodium chloride tablet 1 g  1 g Oral TID WC Shela Leff, MD        VITAL SIGNS: BP (!) 134/93 (BP Location: Right Arm)   Pulse (!) 101   Temp 98.3 F (36.8 C) (Oral)   Resp 18   Ht _0  (1.676 m)   Wt 70 kg   SpO2 99%   BMI 24.91 kg/m  Filed Weights   10/12/18 2136 10/13/18 0430  Weight: 70.3 kg 70 kg    Estimated body mass index is 24.91 kg/m as calculated from the following:   Height as of this encounter: _1  (1.676 m).   Weight as of this encounter: 70 kg.  LABS: CBC:    Component Value Date/Time   WBC 9.1 10/15/2018 0447   HGB 10.6 (L) 10/15/2018 0447   HGB 13.5 08/22/2018 1052   HCT 34.1 (L) 10/15/2018 0447   PLT 153 10/15/2018 0447   PLT 302 08/22/2018 1052   Comprehensive Metabolic Panel:    Component Value Date/Time   NA 130 (L) 10/14/2018 0331   K 4.3 10/14/2018 0331   CO2 26 10/14/2018 0331   BUN 8 10/14/2018 0331   CREATININE 0.52 10/14/2018 0331   ALBUMIN 2.7 (L) 10/14/2018 0331     Review of Systems  Constitutional: Positive for appetite change, fatigue and unexpected weight change.  Respiratory: Positive for shortness of breath.   Neurological: Positive for weakness.  All other systems reviewed and are negative.   Physical Exam Vitals signs and nursing note reviewed.  Constitutional:       General: She is awake.     Appearance: She is cachectic. She is ill-appearing.     Comments: Chronically ill appearing   Cardiovascular:     Rate and Rhythm: Regular rhythm. Tachycardia present.     Pulses: Normal pulses.     Heart sounds: Normal heart sounds.  Pulmonary:     Breath sounds: Decreased breath sounds present.     Comments: Shortness  of breath, 3L/[place]  Abdominal:     General: Abdomen is flat. Bowel sounds are normal.     Palpations: Abdomen is soft.  Skin:    General: Skin is warm and dry.  Neurological:     Mental Status: She is alert, oriented to person, place, and time and easily aroused.     Motor: Weakness and atrophy present.  Psychiatric:        Attention and Perception: Attention normal.        Mood and Affect: Mood normal.        Speech: Speech normal.        Behavior: Behavior normal.        Thought Content: Thought content normal.        Cognition and Memory: Cognition normal.        Judgment: Judgment normal.    Prognosis: < 6 months in the setting of hypertension, depression, stage IV metastatic non-small cell lung cancer with mets to liver, brain, and bone, hyponatremia, anemia, DVT, and generalized weakness.   Discharge Planning:  Home with Hospice  Recommendations:  DNR/DNI-as confirmed by patient  Continue to treat without escalation of care  Patient wishes to discharge home with hospice support for EOL care with support of family and friends. Denies home equipment needs at this time  CM referral for outpatient hospice  D/C oxycontin, oxycodone for pain as patient has been unable to swallow tabs  Roxanol PRN for pain (use for breakthrough), goal to d/c IV morphine in preparation for d/c home  Fentanyl patch 12.5 mcg patch for long-acting pain control  Ativan 0.5 mg every 6hrs PRN for anxiety   Zofran SL PRN for nausea  PMT will continue to support and follow   Palliative Performance Scale: PPS 30%               Patient   expressed understanding and was in agreement with this plan.   Thank you for allowing the Palliative Medicine Team to assist in the care of this patient.  Time In: 0800 Time Out: 0915 Time Total: 75 min.   Visit consisted of counseling and education dealing with the complex and emotionally intense issues of symptom management and palliative care in the setting of serious and potentially life-threatening illness.Greater than 50%  of this time was spent counseling and coordinating care related to the above assessment and plan.  Signed by:  Alda Lea, AGPCNP-BC Palliative Medicine Team  Phone: [phone] Fax: [phone] Pager: [phone] Amion: Bjorn Pippin

## 2018-10-16 ENCOUNTER — Inpatient Hospital Stay (HOSPITAL_COMMUNITY): Payer: Managed Care, Other (non HMO)

## 2018-10-16 ENCOUNTER — Telehealth: Payer: Self-pay | Admitting: Medical Oncology

## 2018-10-16 DIAGNOSIS — I4891 Unspecified atrial fibrillation: Secondary | ICD-10-CM

## 2018-10-16 DIAGNOSIS — J939 Pneumothorax, unspecified: Secondary | ICD-10-CM

## 2018-10-16 LAB — CBC
HCT: 35.6 % — ABNORMAL LOW (ref 36.0–46.0)
Hemoglobin: 11 g/dL — ABNORMAL LOW (ref 12.0–15.0)
MCH: 25.3 pg — ABNORMAL LOW (ref 26.0–34.0)
MCHC: 30.9 g/dL (ref 30.0–36.0)
MCV: 81.8 fL (ref 80.0–100.0)
Platelets: 157 10*3/uL (ref 150–400)
RBC: 4.35 MIL/uL (ref 3.87–5.11)
RDW: 13.9 % (ref 11.5–15.5)
WBC: 8.9 10*3/uL (ref 4.0–10.5)
nRBC: 0 % (ref 0.0–0.2)

## 2018-10-16 LAB — PROTIME-INR
INR: 3.4 — ABNORMAL HIGH (ref 0.8–1.2)
Prothrombin Time: 33.7 seconds — ABNORMAL HIGH (ref 11.4–15.2)

## 2018-10-16 MED ORDER — MORPHINE SULFATE (CONCENTRATE) 10 MG/0.5ML PO SOLN: 5.0000 mg | ORAL | 0 refills | Status: AC | PRN

## 2018-10-16 MED ORDER — ONDANSETRON 4 MG PO TBDP: 4.0000 mg | ORAL_TABLET | Freq: Four times a day (QID) | ORAL | Status: DC | PRN

## 2018-10-16 MED ORDER — ONDANSETRON 4 MG PO TBDP: 4.0000 mg | ORAL_TABLET | Freq: Four times a day (QID) | ORAL | 0 refills | Status: AC | PRN

## 2018-10-16 MED ORDER — BENZONATATE 200 MG PO CAPS: 200.0000 mg | ORAL_CAPSULE | Freq: Three times a day (TID) | ORAL | 0 refills | Status: AC | PRN

## 2018-10-16 MED ORDER — MENTHOL 3 MG MT LOZG: 1.0000 | LOZENGE | OROMUCOSAL | 12 refills | Status: AC | PRN

## 2018-10-16 MED ORDER — LORAZEPAM 0.5 MG PO TABS: 0.5000 mg | ORAL_TABLET | Freq: Four times a day (QID) | ORAL | 0 refills | Status: AC | PRN

## 2018-10-16 MED ORDER — LORAZEPAM 0.5 MG PO TABS: 0.5000 mg | ORAL_TABLET | Freq: Four times a day (QID) | ORAL | 0 refills | Status: DC | PRN

## 2018-10-16 MED ORDER — FENTANYL 12 MCG/HR TD PT72: 1.0000 | MEDICATED_PATCH | TRANSDERMAL | 0 refills | Status: AC

## 2018-10-16 NOTE — Progress Notes (Signed)
Mariah Silvers, RN, CM called [phone] ext [phone], who referred to [facility] [phone] ext [phone] concerning [facility] for pt. [facility] [phone] was called for HHRN/PT/NA related to pt discharging home with new Pleux catheter drain. PT recommended home health PT for safety/blance. CM Carly was called left VM. CM Carly returned call stating that this should have been done two days ago. Two days ago pt was setup with HHPT/NA with [facility] and did not need a HHRN. Today pt was in need of HHRN related to Pleux Catheter. Unable to find a [facility] agency to supply a HHRN. Pt states that her neighbor is a retired Therapist, sports who would help her. Pt will discharge home with [facility] for HHPT/NA and RN neighbor.

## 2018-10-16 NOTE — Progress Notes (Signed)
Pt ambulated in hallway to obtain hr and O2 level with exertion. O2 saturation was 96% on 3L and HR was 95-100 while ambulating. HR did increase into the 120's at the very end of ambulation. MD was made aware.

## 2018-10-16 NOTE — Progress Notes (Signed)
Pt discharged from the unit via wheelchair. Discharge instructions were reviewed with pt and friend at bedside. pleurex drains were ordered and sent home with pt prior to discharge. Spoke with palliative care about going home on hospice.

## 2018-10-16 NOTE — Progress Notes (Signed)
Clarification, Pt will discharge with [facility] and Palliative Care. Referral given. [facility] was cancelled.

## 2018-10-16 NOTE — Progress Notes (Signed)
Pleurx drain forms faxed to Dr. Julien Nordmann office.

## 2018-10-16 NOTE — Discharge Summary (Signed)
Discharge Summary  Mariah Diaz HDQ:[id number] DOB: 18-Feb-1961  PCP: Orpah Melter, MD  Admit date: 10/12/2018 Discharge date: 10/16/2018   Time spent: < 25 minutes  Admitted From: Home Disposition:  Home with Hospice  Recommendations for Outpatient Follow-up:  1. New meds: Morphine concentrate, fentanyl patch, ativan, zofran 2.     Discharge Diagnoses:  Active Hospital Problems   Diagnosis Date Noted   Pleural effusion 10/13/2018   Atrial fibrillation with RVR (HCC) 10/15/2018   Chronic respiratory failure with hypoxia (HCC) 10/15/2018   Metastatic lung cancer (metastasis from lung to other site) [facility]) 10/13/2018   Chronic anemia 10/13/2018   History of DVT (deep vein thrombosis) 10/13/2018   Chronic hyponatremia 09/26/2018    Resolved Hospital Problems  No resolved problems to display.    Discharge Condition: guarded   CODE STATUS:DNR  History of present illness:  Mariah Diaz is a 58 y.o. year old female with medical history significant for stage IV metastatic non-small cell lung cancer with mets to liver,brain, and bone currently on palliative radiation, hypertension, DVT, chronic right-sided hydropneumothorax who presented on 10/12/2018 with worsening shortness of breath 2 days and dry cough and was found to have recurrent malignant pleural effusion in setting of metastatic non-small cell lung cancer. Remaining hospital course addressed in problem based format below:   Hospital Course:  Chronic hypoxic respiratory failure secondary to Recurrent malignant right pleural effusion. s/p palliative pleur-x-placement by IR with 2 L drained on 4/29. Follow up repeat CXR showed improvement in effusion is stable chronic right-sided pneumothorax. Stable on home 3 L oxygen  here in hospital will continue on discharge.  Going home with hospice care services and discontinuing further chemotherapy  Chronic right-sided pneumothorax. Likely ex vacuo due to right  upper lobe tumor with non-reexpanded balloon, has been present since 09/22/2018.  Patient remained stable on home 3 L O2.  Repeat chest x-ray imaging showed stable pneumothorax.  Metastatic stage IV NSCLC with mets to liver, brain and bone.   Palliative consulted during admission patient elects to no longer continue chemotherapy and will be discharged home with supportive hospice care.  Pain regimen was optimized with addition of fentanyl patch, Roxanol 5 mg every 2 hours as needed, Ativan as needed  New onset atrial fibrillation with RVR.  No documented history. EKG confirms with hr in 160s and asymptomatic on 4/29.  Patient spontaneously converted after one-time dose of metoprolol.  Was able to maintain heart rate less than 110 with ambulation (had brief elevation to 120s).  Continue Eliquis which she is taking for left upper extremity DVT,no need for rate control..   Chronically elevated INR in setting of hepatic metastasis.  Remained asymptomatic with no bleeding.  Received vitamin K prior to IR procedure.  LUE DVT.  Eliquis was briefly held prior to Pleurx placement, resumed on discharge   Chronic hyponatremia, stable.   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   Continue salt tabs and fluid restriction.   Consultations:  IR  Procedures/Studies:  Pleurx catheter placement, 4/29  Discharge Exam: BP 111/78 (BP Location: Right Arm)    Pulse (!) 104    Temp 98 F (36.7 C) (Oral)    Resp 20    Ht 5\' 6"  (1.676 m)    Wt 61.9 kg    SpO2 98%    BMI 22.03 kg/m    General: Female who looks older than stated age, no acute distress  Cardiovascular:  Regular rate and rhythm, no appreciable murmur  rubs or gallops, no peripheral edema  Respiratory: Normal respiratory effort on 3 L, diminished breath sounds on right side  Abdomen: Soft, nondistended, nontender, normal bowel sounds  Musculoskeletal: Normal range of motion  Skin no rashes or lesions  Neurologic no appreciable  focal deficits   Discharge Instructions You were cared for by a hospitalist during your hospital stay. If you have any questions about your discharge medications or the care you received while you were in the hospital after you are discharged, you can call the unit and asked to speak with the hospitalist on call if the hospitalist that took care of you is not available. Once you are discharged, your primary care physician will handle any further medical issues. Please note that NO REFILLS for any discharge medications will be authorized once you are discharged, as it is imperative that you return to your primary care physician (or establish a relationship with a primary care physician if you do not have one) for your aftercare needs so that they can reassess your need for medications and monitor your lab values.  Discharge Instructions    Diet - low sodium heart healthy   Complete by:  As directed    Increase activity slowly   Complete by:  As directed      Allergies as of 10/16/2018   No Known Allergies     Medication List    STOP taking these medications   morphine 15 MG tablet Commonly known as:  MSIR Replaced by:  morphine CONCENTRATE 10 MG/0.5ML Soln concentrated solution     TAKE these medications   benzonatate 200 MG capsule Commonly known as:  TESSALON Take 1 capsule (200 mg total) by mouth 3 (three) times daily as needed for cough.   Eliquis DVT/PE Starter Pack 5 MG Tabs Take as directed on package: start with two-5mg  tablets twice daily for 7 days. On day 8, switch to one-5mg  tablet twice daily. What changed:    how much to take  how to take this  when to take this  additional instructions   fentaNYL 12 MCG/HR Commonly known as:  [place] 1 patch onto the skin every 3 (three) days for 6 days. Start taking on:  Oct 18, 2018   guaiFENesin-dextromethorphan 100-10 MG/5ML syrup Commonly known as:  ROBITUSSIN DM Take 5 mLs by mouth every 4 (four) hours as  needed for cough (chest congestion).   LORazepam 0.5 MG tablet Commonly known as:  ATIVAN Take 1 tablet (0.5 mg total) by mouth every 6 (six) hours as needed for anxiety (nausea). What changed:    when to take this  reasons to take this   meclizine 12.5 MG tablet Commonly known as:  ANTIVERT Take 2 tablets (25 mg total) by mouth 3 (three) times daily as needed for dizziness or nausea.   menthol-cetylpyridinium 3 MG lozenge Commonly known as:  CEPACOL Take 1 lozenge (3 mg total) by mouth as needed for sore throat.   morphine CONCENTRATE 10 MG/0.5ML Soln concentrated solution Take 0.25 mLs (5 mg total) by mouth every 2 (two) hours as needed for up to 5 days for moderate pain, severe pain or shortness of breath. Replaces:  morphine 15 MG tablet   ondansetron 4 MG disintegrating tablet Commonly known as:  ZOFRAN-ODT Take 1 tablet (4 mg total) by mouth every 6 (six) hours as needed for nausea or vomiting.   sodium chloride 1 g tablet Take 1 tablet (1 g total) by mouth 3 (three) times daily with  meals.      No Known Allergies    The results of significant diagnostics from this hospitalization (including imaging, microbiology, ancillary and laboratory) are listed below for reference.    Significant Diagnostic Studies: Dg Chest 1 View  Result Date: 09/25/2018 CLINICAL DATA:  Aspiration into airway EXAM: CHEST  1 VIEW COMPARISON:  Two days ago FINDINGS: Large but diminished right pneumothorax. Moderate pleural effusion on the right with pulmonary opacification. There is pulmonary obstruction by CT. Comparatively clear left chest. Borderline heart size. IMPRESSION: Large right hydropneumothorax with decreasing gas component since 2 days ago. Electronically Signed   By: Monte Fantasia M.D.   On: 09/25/2018 07:48   Dg Chest 1 View  Result Date: 09/22/2018 CLINICAL DATA:  Stage for lung cancer diagnosed 1 month ago. Shortness of breath. EXAM: CHEST  1 VIEW COMPARISON:  Earlier same day  FINDINGS: Heart size is normal. Left chest remains clear. There has been previous right thoracentesis with removal of the left pleural fluid. There is a right pneumothorax estimated at 50-60%. No tension is evident. IMPRESSION: 50-60% right pneumothorax following thoracentesis. No visible residual pleural fluid. No evidence of tension. These results will be called to the ordering clinician or representative by the Radiologist Assistant, and communication documented in the PACS or zVision Dashboard. Electronically Signed   By: Nelson Chimes M.D.   On: 09/22/2018 15:34   Dg Chest 2 View  Result Date: 10/12/2018 CLINICAL DATA:  58 year old female with increasing shortness of breath. Stage IV lung cancer. EXAM: CHEST - 2 VIEW COMPARISON:  09/26/2018 and earlier. FINDINGS: Right hydropneumothorax demonstrated previously, the air component appears resolved but with interval increasing fluid component which is at least partially loculated. Subsequent mildly decreased right lung ventilation since 09/26/2018. Stable cardiac size and mediastinal contours. Visualized tracheal air column is within normal limits. The left lung appears stable in clear. Negative visible bowel gas pattern. No acute osseous abnormality identified. IMPRESSION: 1. Increased right pleural effusion but resolved component of pneumothorax since 09/26/2018. 2. Overall right lung ventilation appears mildly decreased since the prior. 3. Left lung remains negative. Electronically Signed   By: Genevie Ann M.D.   On: 10/12/2018 22:55   Ct Angio Chest Pe W/cm &/or Wo Cm  Result Date: 10/13/2018 CLINICAL DATA:  Initial evaluation for acute dyspnea, angina. EXAM: CT ANGIOGRAPHY CHEST WITH CONTRAST TECHNIQUE: Multidetector CT imaging of the chest was performed using the standard protocol during bolus administration of intravenous contrast. Multiplanar CT image reconstructions and MIPs were obtained to evaluate the vascular anatomy. CONTRAST:  170mL OMNIPAQUE  IOHEXOL 350 MG/ML SOLN COMPARISON:  Prior radiograph from 10/12/2018 as well as prior CT from 09/09/2018. FINDINGS: Cardiovascular: Intrathoracic aorta normal in caliber without aneurysm or other acute finding. Visualized great vessels intact and within normal limits. Heart and mediastinal structures mildly shifted to the left due to a large right pleural effusion. Heart size itself is within normal limits. Small pericardial effusion noted. Pulmonary arterial tree adequately opacified for evaluation. Main pulmonary artery within normal limits for caliber. Proximal segmental right pulmonary arteries attenuated by the right perihilar mass in large right pleural effusion. Evaluation of the distal left pulmonary arteries limited by motion artifact. No convincing filling defect to suggest acute pulmonary embolism. Re-formatted imaging confirms these findings. Mediastinum/Nodes: Visualized thyroid grossly normal. Mildly enlarged 11 mm prevascular node noted. Enlarged subcarinal nodal conglomerate measures up to 2 cm in short axis. No left-sided hilar adenopathy. Periaortic nodes measure up to 12 mm. No enlarged axillary  nodes. Lungs/Pleura: Large masslike opacity again seen involving the right upper lobe with involvement of the adjacent right hilum, measuring approximately 7.1 x 5.7 cm, likely similar to previous. Exact measurements somewhat difficult due to adjacent atelectatic lung. Obstruction of the adjacent right upper lobe bronchus. Bronchus intermedius is narrowed but remains patent. Attenuation and narrowing of the right upper lobe segmental pulmonary arteries again noted. Associated large right pleural effusion with associated atelectasis, increased in size from previous. Lilly a small portion of the right lung is pneumatized. Scattered pleural based densities at the posterior left lower lobe likely reflect atelectasis. No other focal infiltrates. No pneumothorax. Few scattered nodular densities noted at the left  lung apex, largest of which measures 8 mm (series 10, image 21), indeterminate. Upper Abdomen: Multiple hepatic metastases again seen, not fully evaluated on this exam, but grossly similar to previous. Scattered pericaval nodes measure up to 11 mm, grossly similar. Remainder of the visualized upper abdomen demonstrates no other acute finding. Musculoskeletal: Extensive venous collateral seen throughout the upper left chest. Asymmetric skin thickening noted at the left breast (series 4, image 73), indeterminate. Diffuse lytic metastases seen throughout the visualized osseous structures, similar to previous. Associated pathologic fractures involving the T6 and T8 vertebral bodies are similar to previous. Mildly progressive pathologic fracture at the superior endplate of O17 as compared to previous. There is an acute to subacute appearing fracture of the left lateral third rib, new from previous (series 4, image 24). No other definite acute osseous abnormality. Review of the MIP images confirms the above findings. IMPRESSION: 1. No CT evidence for acute pulmonary embolism. 2. Large central right upper lobe mass with involvement of the right hilum, most compatible with primary bronchogenic carcinoma, grossly similar to previous. 3. Associated large right pleural effusion with associated compressive atelectasis. This may be malignant in nature. 4. Diffuse hepatic metastases, similar to previous. 5. Scattered mediastinal and upper abdominal adenopathy as above, likely reflecting metastatic disease. 6. Diffuse lytic osseous metastases. Associated pathologic compression fractures involving the T6 and T8 vertebral bodies are similar to previous. Associated height loss at the T10 vertebral body has progressed from previous. Acute to subacute fracture of the left lateral third rib also new from previous. 7. Increased skin thickening at the anterior left breast, indeterminate, and may be partially related to venous congestion  given the multiple venous collaterals within the left chest wall. Correlation with physical exam and mammography suggested. 8. Few scattered subcentimeter pulmonary nodules measuring up to 8 mm at the left lung apex, indeterminate, but new from previous. Attention at follow-up recommended. Electronically Signed   By: Jeannine Boga M.D.   On: 10/13/2018 01:26   Dg Chest Port 1 View  Result Date: 10/16/2018 CLINICAL DATA:  Pneumothorax.  History of lung carcinoma EXAM: PORTABLE CHEST 1 VIEW COMPARISON:  October 15, 2018 FINDINGS: The drainage catheter remains on the right. There is a sizable pneumothorax on the right, stable, without tension component. There is pleural effusion on the right which may be slightly increased. There is atelectatic change in the right base. Left lung is clear. Cardiac silhouette is stable with heart size normal. No adenopathy evident. No bone lesions. IMPRESSION: Stable positioning of the drainage catheter. Hydropneumothorax on the right remains stable in size without tension component. There is atelectatic change in the right base. There is marginally more effusion on the right compared to previous study. Left lung clear. Stable cardiac silhouette. Electronically Signed   By: Lowella Grip III M.D.  On: 10/16/2018 09:17   Dg Chest Port 1 View  Result Date: 10/15/2018 CLINICAL DATA:  58 year old female with right PleurX placement EXAM: PORTABLE CHEST 1 VIEW COMPARISON:  10/15/2018, CT 10/13/2018, chest x-ray 10/12/2018 FINDINGS: Interval placement of right tunneled pleural drainage catheter. Blunting of the right costophrenic angle compatible with small residual fluid. Pneumothorax on the right. Opacity of the right upper lobe, compatible with known right upper lobe tumor. Left lung relatively well aerated. No displaced fracture. IMPRESSION: Interval placement of right-sided tunneled pleural drainage catheter with pneumothorax. While this is favored to represent ex vacuo  given the tumor mass of the right upper lobe, it is possible that there could be underlying air leak. Close observation recommended, potentially with repeat plain film. These results were called by telephone at the time of interpretation on 10/15/2018 at 7:37 pm to nurse caring for the patient, Ms Owens Shark. Electronically Signed   By: Corrie Mckusick D.O.   On: 10/15/2018 19:38   Dg Chest Port 1 View  Addendum Date: 09/26/2018   ADDENDUM REPORT: 09/26/2018 14:08 ADDENDUM: The chest x-ray was reviewed at the request of Dr. Darl Householder for potential need for a chest tube. When compared to the 04/9 study, there is further interval expansion of the right lower lobe and compared to the 4/7 and 4/6 chest x-rays, there is considerably improved aeration of the right lower lung. The right upper lobe is known to be densely involved with tumor and would not be expected to re-expand. There is some expected reaccumulation of right pleural fluid since thoracentesis of moderate volume. Electronically Signed   By: Aletta Edouard M.D.   On: 09/26/2018 14:08   Result Date: 09/26/2018 CLINICAL DATA:  Shortness of breath, cough, nausea and vomiting. EXAM: PORTABLE CHEST 1 VIEW COMPARISON:  Single-view of the chest 09/25/2018, 09/23/2018 and 09/22/2018. FINDINGS: Right hydropneumothorax seen on the prior exams persists and appears unchanged since the most recent study. The left lung is clear and well expanded. No left effusion. Heart size is normal. IMPRESSION: No change in a large right hydropneumothorax since the most recent exam. No new abnormality. Electronically Signed: By: Inge Rise M.D. On: 09/26/2018 13:26   Portable Chest 1 View  Result Date: 09/23/2018 CLINICAL DATA:  Right pneumothorax. EXAM: PORTABLE CHEST 1 VIEW COMPARISON:  09/22/2018 FINDINGS: There is a persistent large right pneumothorax with further collapse of the right lower lobe. The right middle and upper lobes appear completely collapsed. There is a recurrent  small right effusion. Left lung is clear. Heart size and pulmonary vascularity are normal. No evidence of tension pneumothorax. IMPRESSION: Persistent large right pneumothorax with further collapse of the right lower lobe and with complete collapse of the right middle and upper lobes. The previous CT scan demonstrated a mass obstructing the upper lobe bronchus. Electronically Signed   By: Lorriane Shire M.D.   On: 09/23/2018 08:01   Dg Chest Port 1 View  Result Date: 09/22/2018 CLINICAL DATA:  Followup right pneumothorax. EXAM: PORTABLE CHEST 1 VIEW COMPARISON:  Earlier same day FINDINGS: Persistent right pneumothorax with complete collapse of the right lung. Pneumothorax is probably enlarging slightly, now estimated at 60-70%. No definite evidence of tension. IMPRESSION: Probable slight worsening of the right pneumothorax, estimated at 60-70%. Electronically Signed   By: Nelson Chimes M.D.   On: 09/22/2018 17:13   Dg Chest Port 1 View  Result Date: 09/22/2018 CLINICAL DATA:  Shortness of breath 2 days. Recently diagnosed with right-sided stage IV lung cancer. EXAM: PORTABLE  CHEST 1 VIEW COMPARISON:  Chest CT 09/09/2018 FINDINGS: Exam demonstrates moderate opacification over the right lung compatible with moderate size effusion tracking to the apex. Findings are similar to patient's recent CT scan which demonstrated a patient's known large right upper lobe lung cancer with large right effusion. Borders of the known right upper lobe mass or not well visualized. Left lung is clear. Cardiomediastinal silhouette is within normal. Mild mottled sclerosis over the left scapula in the region of the glenoid suggesting metastatic disease. IMPRESSION: Moderate opacification over the right lung compatible with moderate size effusion likely with basilar atelectasis without significant change from recent CT. Patient's known medial right upper lobe lung cancer is not well visualized. Mottled sclerosis over the left  scapula/glenoid suggesting metastatic disease. Electronically Signed   By: Marin Olp M.D.   On: 09/22/2018 12:19   Vas [place] Upper Extremity Venous Duplex  Result Date: 09/29/2018 UPPER VENOUS STUDY  Indications: Pain, Swelling, and Recent IV LUE Performing Technologist: Maudry Mayhew MHA, RDMS, RVT, RDCS  Examination Guidelines: A complete evaluation includes B-mode imaging, spectral Doppler, color Doppler, and power Doppler as needed of all accessible portions of each vessel. Bilateral testing is considered an integral part of a complete examination. Limited examinations for reoccurring indications may be performed as noted.  Right Findings: +----------+------------+---------+-----------+----------+-------+  RIGHT      Compressible Phasicity Spontaneous Properties Summary  +----------+------------+---------+-----------+----------+-------+  Subclavian                 Yes        Yes                         +----------+------------+---------+-----------+----------+-------+  Left Findings: +----------+------------+---------+-----------+----------+--------------+  LEFT       Compressible Phasicity Spontaneous Properties    Summary      +----------+------------+---------+-----------+----------+--------------+  IJV            None                   No                     Acute       +----------+------------+---------+-----------+----------+--------------+  Subclavian     None                   No                     Acute       +----------+------------+---------+-----------+----------+--------------+  Axillary       None                   No                     Acute       +----------+------------+---------+-----------+----------+--------------+  Brachial       None                   No                     Acute       +----------+------------+---------+-----------+----------+--------------+  Radial         Full                                                       +----------+------------+---------+-----------+----------+--------------+  Ulnar          Full                                                      +----------+------------+---------+-----------+----------+--------------+  Cephalic       Full                                                      +----------+------------+---------+-----------+----------+--------------+  Basilic                                                  Not visualized  +----------+------------+---------+-----------+----------+--------------+  Summary:  Right: No evidence of thrombosis in the subclavian.  Left: Findings consistent with acute deep vein thrombosis involving the left internal jugular veins, left subclavian veins, left axillary vein and left brachial veins.  *See table(s) above for measurements and observations.  Diagnosing physician: Monica Martinez MD Electronically signed by Monica Martinez MD on 09/29/2018 at 5:13:00 PM.    Final    Dg Esophagus W Single Cm (sol Or Thin Ba)  Result Date: 09/24/2018 CLINICAL DATA:  Dysphagia, with feeling of food getting stuck in upper esophagus. Recently diagnosed with metastatic lung cancer. EXAM: ESOPHOGRAM/BARIUM SWALLOW TECHNIQUE: Single contrast examination was performed using  thin barium. FLUOROSCOPY TIME:  Fluoroscopy Time:  2 minutes and 48 seconds Radiation Exposure Index (if provided by the fluoroscopic device): 50.1 mGy Number of Acquired Spot Images: 0 COMPARISON:  CT of 09/09/2018. FINDINGS: Exam was performed using a single-contrast technique, with the patient in an RAO position secondary to relative immobility and discomfort. Evaluation of esophageal peristalsis demonstrates an incomplete peristaltic wave with contrast stasis in the upper esophagus. Example on series 1 and 2. There are tertiary contractions with eventual passage of contrast in the lower esophagus. Full column evaluation of the esophagus demonstrates an approximately 3 cm segment of relatively mild esophageal  underdistention, suspicious for mass-effect from thoracic adenopathy. This is identified at and just inferior to the carina, including on image 197/4. Also image 95/3. IMPRESSION: 1. 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 2. Mild limitations, as detailed above. 3. Mid esophageal underdistention, likely due to relatively mild mass effect from mediastinal adenopathy. Electronically Signed   By: Abigail Miyamoto M.D.   On: 09/24/2018 15:14   Ir Perc Pleural Drain W/indwell Cath W/img Guide  Result Date: 10/15/2018 INDICATION: 58 year old female with a recurrent symptomatic right-sided malignant pleural effusion. She presents for tunneled pleural drainage catheter placement. EXAM: IR PERC PLEURAL DRAIN W/INDWELL CATH W/IMG GUIDE MEDICATIONS: 2 g Ancef ANESTHESIA/SEDATION: Fentanyl 25 mcg IV; Versed 1 mg IV Moderate Sedation Time:  13 minutes The patient was continuously monitored during the procedure by the interventional radiology nurse under my direct supervision. COMPLICATIONS: None immediate. PROCEDURE: Informed written consent was obtained from the patient after a thorough discussion of the procedural risks, benefits and alternatives. All questions were addressed. Maximal Sterile Barrier Technique was utilized including caps, mask, sterile gowns, sterile gloves, sterile drape, hand hygiene and skin antiseptic. A timeout was performed prior  to the initiation of the procedure. Ultrasound was used to interrogate the right chest. There is a large pleural effusion. Local anesthesia was attained by infiltration with 1% lidocaine. A small dermatotomy was made. An 18 gauge sheath needle was carefully advanced into the pleural fluid. The needle portion was removed. A 75 cm Amplatz wire was then advanced into the pleural space. A suitable skin exit site anterior and medial to the pleural entry site was selected. Local anesthesia was again attained by infiltration with 1% lidocaine. A small  dermatotomy was made. The 15.5 French PleurX tunneled drainage catheter was then tunneled from the skin exit site to the dermatotomy overlying the pleural entry site. A peel-away sheath was then advanced over the wire and into the pleural space. The catheter was advanced through the peel-away sheath and the peel-away sheath was discarded. The catheter was connected to suction and a total of 2 L of pleural fluid was evacuated. The dermatotomy overlying the pleural entry site was closed with a single inverted interrupted 3 0 Vicryl suture in the epidermis sealed with Dermabond. The catheter was secured at the exit site with an 0 Prolene retention suture. Sterile bandages were applied. The patient tolerated the procedure well. IMPRESSION: Technically successful placement of a right-sided tunneled pleural drainage catheter. Electronically Signed   By: Jacqulynn Cadet M.D.   On: 10/15/2018 15:36   US Thoracentesis Asp Pleural Space W/img Guide  Result Date: 09/22/2018 INDICATION: Patient with history of stage IV lung cancer, dyspnea, large right pleural effusion; request received for therapeutic right thoracentesis. EXAM: ULTRASOUND GUIDED THERAPEUTIC RIGHT THORACENTESIS MEDICATIONS: None COMPLICATIONS: None immediate. PROCEDURE: An ultrasound guided thoracentesis was thoroughly discussed with the patient and questions answered. The benefits, risks, alternatives and complications were also discussed. The patient understands and wishes to proceed with the procedure. Written consent was obtained. Ultrasound was performed to localize and mark an adequate pocket of fluid in the right chest. The area was then prepped and draped in the normal sterile fashion. 1% Lidocaine was used for local anesthesia. Under ultrasound guidance a 6 Fr Safe-T-Centesis catheter was introduced. Thoracentesis was performed. The catheter was removed and a dressing applied. FINDINGS: A total of approximately 1.9 liters of yellow fluid was  removed. IMPRESSION: Successful ultrasound guided therapeutic right thoracentesis yielding 1.9 liters of pleural fluid. Follow-up chest x-ray revealed approximately 50-60% pneumothorax, most likely ex vacuo in origin with failed reexpansion of the lung due to tumor. Above findings discussed with Drs. Yamagata/Mohamed and patient's nurse. Patient currently stable. If symptoms worsen can obtain additional follow-up chest x-ray. Read by: Rowe Robert, PA-C Electronically Signed   By: Aletta Edouard M.D.   On: 09/22/2018 16:00    Microbiology: Recent Results (from the past 240 hour(s))  SARS Coronavirus 2 [facility] order, Performed in [facility] hospital lab)     Status: None   Collection Time: 10/12/18 10:45 PM  Result Value Ref Range Status   SARS Coronavirus 2 NEGATIVE NEGATIVE Final    Comment: (NOTE) If result is NEGATIVE SARS-CoV-2 target nucleic acids are NOT DETECTED. The SARS-CoV-2 RNA is generally detectable in upper and lower  respiratory specimens during the acute phase of infection. The lowest  concentration of SARS-CoV-2 viral copies this assay can detect is 250  copies / mL. A negative result does not preclude SARS-CoV-2 infection  and should not be used as the sole basis for treatment or other  patient management decisions.  A negative result may occur with  improper specimen collection /  handling, submission of specimen other  than nasopharyngeal swab, presence of viral mutation(s) within the  areas targeted by this assay, and inadequate number of viral copies  (<250 copies / mL). A negative result must be combined with clinical  observations, patient history, and epidemiological information. If result is POSITIVE SARS-CoV-2 target nucleic acids are DETECTED. The SARS-CoV-2 RNA is generally detectable in upper and lower  respiratory specimens dur ing the acute phase of infection.  Positive  results are indicative of active infection with SARS-CoV-2.  Clinical  correlation  with patient history and other diagnostic information is  necessary to determine patient infection status.  Positive results do  not rule out bacterial infection or co-infection with other viruses. If result is PRESUMPTIVE POSTIVE SARS-CoV-2 nucleic acids MAY BE PRESENT.   A presumptive positive result was obtained on the submitted specimen  and confirmed on repeat testing.  While 2019 novel coronavirus  (SARS-CoV-2) nucleic acids may be present in the submitted sample  additional confirmatory testing may be necessary for epidemiological  and / or clinical management purposes  to differentiate between  SARS-CoV-2 and other Sarbecovirus currently known to infect humans.  If clinically indicated additional testing with an alternate test  methodology [phone]) is advised. The SARS-CoV-2 RNA is generally  detectable in upper and lower respiratory sp ecimens during the acute  phase of infection. The expected result is Negative. Fact Sheet for Patients:  StrictlyIdeas.no Fact Sheet for Healthcare Providers: BankingDealers.co.za This test is not yet approved or cleared by the [place] FDA and has been authorized for detection and/or diagnosis of SARS-CoV-2 by FDA under an Emergency Use Authorization (EUA).  This EUA will remain in effect (meaning this test can be used) for the duration of the COVID-19 declaration under Section 564(b)(1) of the Act, 21 U.S.C. section 360bbb-3(b)(1), unless the authorization is terminated or revoked sooner. Performed at [facility], [place] [address], [place], [place] [address]      Labs: Basic Metabolic Panel: Recent Labs  Lab 10/12/18 2218 10/13/18 0449 10/14/18 0331  NA 128* 128* 130*  K 3.8 4.1 4.3  CL 88* 88* 90*  CO2 26 27 26   GLUCOSE 97 456* 97  BUN 7 8 8   CREATININE 0.49 0.47 0.52  CALCIUM 9.0 9.1 8.9   Liver Function Tests: Recent Labs  Lab 10/12/18 2218  10/14/18 0331  AST 22 25  ALT 18 19  ALKPHOS 184* 174*  BILITOT 0.9 0.9  PROT 7.0 6.5  ALBUMIN 2.8* 2.7*   No results for input(s): LIPASE, AMYLASE in the last 168 hours. No results for input(s): AMMONIA in the last 168 hours. CBC: Recent Labs  Lab 10/12/18 2218 10/13/18 0449 10/14/18 0331 10/15/18 0447 10/16/18 0458  WBC 10.0 9.0 9.3 9.1 8.9  NEUTROABS 7.6  --   --   --   --   HGB 11.2* 11.2* 10.9* 10.6* 11.0*  HCT 35.0* 34.9* 34.7* 34.1* 35.6*  MCV 81.0 81.0 82.2 81.4 81.8  PLT 183 182 173 153 157   Cardiac Enzymes: Recent Labs  Lab 10/12/18 2218  TROPONINI <0.03   BNP: BNP (last 3 results) No results for input(s): BNP in the last 8760 hours.  ProBNP (last 3 results) No results for input(s): PROBNP in the last 8760 hours.  CBG: No results for input(s): GLUCAP in the last 168 hours.     Signed:  Desiree Hane, MD [facility] 10/16/2018, 10:52 AM

## 2018-10-16 NOTE — Telephone Encounter (Signed)
Faxed note to [facility] to contact hospice for pleurix supply order.  Contacted Hospice and told them Mariah Diaz is not attending and order for Pleurix supplies need to come from Hospice atttending .

## 2018-10-16 NOTE — Progress Notes (Signed)
Referring Physician(s): Dr. Julien Nordmann  Supervising Physician: Jacqulynn Cadet  Patient Status:  [facility] - In-pt  Chief Complaint: Recurrent, malignant pleural effusion  Subjective: Sitting up in bed.  No complaints. Ready for education on Pleurx use.  Neighbor in room with her.   Allergies: Patient has no known allergies.  Medications: Prior to Admission medications   Medication Sig Start Date End Date Taking? Authorizing Provider  Eliquis DVT/PE Starter Pack (ELIQUIS STARTER PACK) 5 MG TABS Take as directed on package: start with two-5mg  tablets twice daily for 7 days. On day 8, switch to one-5mg  tablet twice daily. Patient taking differently: Take 5 mg by mouth 2 (two) times a day.  09/28/18  Yes Pokhrel, Laxman, MD  guaiFENesin-dextromethorphan (ROBITUSSIN DM) 100-10 MG/5ML syrup Take 5 mLs by mouth every 4 (four) hours as needed for cough (chest congestion). 09/28/18  Yes Pokhrel, Laxman, MD  LORazepam (ATIVAN) 0.5 MG tablet Take 1 tablet (0.5 mg total) by mouth every 8 (eight) hours. Patient taking differently: Take 0.5 mg by mouth every 8 (eight) hours as needed for anxiety (nausea).  09/29/18  Yes Gery Pray, MD  meclizine (ANTIVERT) 12.5 MG tablet Take 2 tablets (25 mg total) by mouth 3 (three) times daily as needed for dizziness or nausea. 09/28/18  Yes Pokhrel, Laxman, MD  morphine (MSIR) 15 MG tablet Take 1 tablet (15 mg total) by mouth every 4 (four) hours as needed for severe pain. 10/01/18  Yes Gery Pray, MD  benzonatate (TESSALON) 200 MG capsule Take 1 capsule (200 mg total) by mouth 3 (three) times daily as needed for cough. 10/16/18   Desiree Hane, MD  fentaNYL (DURAGESIC) 12 MCG/HR Place 1 patch onto the skin every 3 (three) days for 6 days. 10/18/18 10/24/18  Desiree Hane, MD  LORazepam (ATIVAN) 0.5 MG tablet Take 1 tablet (0.5 mg total) by mouth every 6 (six) hours as needed for anxiety (nausea). 10/16/18   Desiree Hane, MD  menthol-cetylpyridinium (CEPACOL)  3 MG lozenge Take 1 lozenge (3 mg total) by mouth as needed for sore throat. 10/16/18   Oretha Milch D, MD  Morphine Sulfate (MORPHINE CONCENTRATE) 10 MG/0.5ML SOLN concentrated solution Take 0.25 mLs (5 mg total) by mouth every 2 (two) hours as needed for up to 5 days for moderate pain, severe pain or shortness of breath. 10/16/18 10/21/18  Desiree Hane, MD  ondansetron (ZOFRAN-ODT) 4 MG disintegrating tablet Take 1 tablet (4 mg total) by mouth every 6 (six) hours as needed for nausea or vomiting. 10/16/18   Oretha Milch D, MD  sodium chloride 1 g tablet Take 1 tablet (1 g total) by mouth 3 (three) times daily with meals. Patient not taking: Reported on 10/13/2018 09/28/18   Flora Lipps, MD     Vital Signs: BP 111/78 (BP Location: Right Arm)    Pulse (!) 104    Temp 98 F (36.7 C) (Oral)    Resp 20    Ht 5\' 6"  (1.676 m)    Wt 136 lb 7.4 oz (61.9 kg)    SpO2 98% Comment: ambulatory   BMI 22.03 kg/m   Physical Exam  NAD, alert Chest:  Pleurx in place on right.  Insertion site c/d/i.  No erythema or warmth.  Non-tender.   Imaging: Dg Chest 2 View  Result Date: 10/12/2018 CLINICAL DATA:  58 year old female with increasing shortness of breath. Stage IV lung cancer. EXAM: CHEST - 2 VIEW COMPARISON:  09/26/2018 and earlier. FINDINGS: Right hydropneumothorax demonstrated  previously, the air component appears resolved but with interval increasing fluid component which is at least partially loculated. Subsequent mildly decreased right lung ventilation since 09/26/2018. Stable cardiac size and mediastinal contours. Visualized tracheal air column is within normal limits. The left lung appears stable in clear. Negative visible bowel gas pattern. No acute osseous abnormality identified. IMPRESSION: 1. Increased right pleural effusion but resolved component of pneumothorax since 09/26/2018. 2. Overall right lung ventilation appears mildly decreased since the prior. 3. Left lung remains negative.  Electronically Signed   By: Genevie Ann M.D.   On: 10/12/2018 22:55   Ct Angio Chest Pe W/cm &/or Wo Cm  Result Date: 10/13/2018 CLINICAL DATA:  Initial evaluation for acute dyspnea, angina. EXAM: CT ANGIOGRAPHY CHEST WITH CONTRAST TECHNIQUE: Multidetector CT imaging of the chest was performed using the standard protocol during bolus administration of intravenous contrast. Multiplanar CT image reconstructions and MIPs were obtained to evaluate the vascular anatomy. CONTRAST:  171mL OMNIPAQUE IOHEXOL 350 MG/ML SOLN COMPARISON:  Prior radiograph from 10/12/2018 as well as prior CT from 09/09/2018. FINDINGS: Cardiovascular: Intrathoracic aorta normal in caliber without aneurysm or other acute finding. Visualized great vessels intact and within normal limits. Heart and mediastinal structures mildly shifted to the left due to a large right pleural effusion. Heart size itself is within normal limits. Small pericardial effusion noted. Pulmonary arterial tree adequately opacified for evaluation. Main pulmonary artery within normal limits for caliber. Proximal segmental right pulmonary arteries attenuated by the right perihilar mass in large right pleural effusion. Evaluation of the distal left pulmonary arteries limited by motion artifact. No convincing filling defect to suggest acute pulmonary embolism. Re-formatted imaging confirms these findings. Mediastinum/Nodes: Visualized thyroid grossly normal. Mildly enlarged 11 mm prevascular node noted. Enlarged subcarinal nodal conglomerate measures up to 2 cm in short axis. No left-sided hilar adenopathy. Periaortic nodes measure up to 12 mm. No enlarged axillary nodes. Lungs/Pleura: Large masslike opacity again seen involving the right upper lobe with involvement of the adjacent right hilum, measuring approximately 7.1 x 5.7 cm, likely similar to previous. Exact measurements somewhat difficult due to adjacent atelectatic lung. Obstruction of the adjacent right upper lobe  bronchus. Bronchus intermedius is narrowed but remains patent. Attenuation and narrowing of the right upper lobe segmental pulmonary arteries again noted. Associated large right pleural effusion with associated atelectasis, increased in size from previous. Lilly a small portion of the right lung is pneumatized. Scattered pleural based densities at the posterior left lower lobe likely reflect atelectasis. No other focal infiltrates. No pneumothorax. Few scattered nodular densities noted at the left lung apex, largest of which measures 8 mm (series 10, image 21), indeterminate. Upper Abdomen: Multiple hepatic metastases again seen, not fully evaluated on this exam, but grossly similar to previous. Scattered pericaval nodes measure up to 11 mm, grossly similar. Remainder of the visualized upper abdomen demonstrates no other acute finding. Musculoskeletal: Extensive venous collateral seen throughout the upper left chest. Asymmetric skin thickening noted at the left breast (series 4, image 73), indeterminate. Diffuse lytic metastases seen throughout the visualized osseous structures, similar to previous. Associated pathologic fractures involving the T6 and T8 vertebral bodies are similar to previous. Mildly progressive pathologic fracture at the superior endplate of B09 as compared to previous. There is an acute to subacute appearing fracture of the left lateral third rib, new from previous (series 4, image 24). No other definite acute osseous abnormality. Review of the MIP images confirms the above findings. IMPRESSION: 1. No CT evidence for acute  pulmonary embolism. 2. Large central right upper lobe mass with involvement of the right hilum, most compatible with primary bronchogenic carcinoma, grossly similar to previous. 3. Associated large right pleural effusion with associated compressive atelectasis. This may be malignant in nature. 4. Diffuse hepatic metastases, similar to previous. 5. Scattered mediastinal and  upper abdominal adenopathy as above, likely reflecting metastatic disease. 6. Diffuse lytic osseous metastases. Associated pathologic compression fractures involving the T6 and T8 vertebral bodies are similar to previous. Associated height loss at the T10 vertebral body has progressed from previous. Acute to subacute fracture of the left lateral third rib also new from previous. 7. Increased skin thickening at the anterior left breast, indeterminate, and may be partially related to venous congestion given the multiple venous collaterals within the left chest wall. Correlation with physical exam and mammography suggested. 8. Few scattered subcentimeter pulmonary nodules measuring up to 8 mm at the left lung apex, indeterminate, but new from previous. Attention at follow-up recommended. Electronically Signed   By: Jeannine Boga M.D.   On: 10/13/2018 01:26   Dg Chest Port 1 View  Result Date: 10/16/2018 CLINICAL DATA:  Pneumothorax.  History of lung carcinoma EXAM: PORTABLE CHEST 1 VIEW COMPARISON:  October 15, 2018 FINDINGS: The drainage catheter remains on the right. There is a sizable pneumothorax on the right, stable, without tension component. There is pleural effusion on the right which may be slightly increased. There is atelectatic change in the right base. Left lung is clear. Cardiac silhouette is stable with heart size normal. No adenopathy evident. No bone lesions. IMPRESSION: Stable positioning of the drainage catheter. Hydropneumothorax on the right remains stable in size without tension component. There is atelectatic change in the right base. There is marginally more effusion on the right compared to previous study. Left lung clear. Stable cardiac silhouette. Electronically Signed   By: Lowella Grip III M.D.   On: 10/16/2018 09:17   Dg Chest Port 1 View  Result Date: 10/15/2018 CLINICAL DATA:  58 year old female with right PleurX placement EXAM: PORTABLE CHEST 1 VIEW COMPARISON:   10/15/2018, CT 10/13/2018, chest x-ray 10/12/2018 FINDINGS: Interval placement of right tunneled pleural drainage catheter. Blunting of the right costophrenic angle compatible with small residual fluid. Pneumothorax on the right. Opacity of the right upper lobe, compatible with known right upper lobe tumor. Left lung relatively well aerated. No displaced fracture. IMPRESSION: Interval placement of right-sided tunneled pleural drainage catheter with pneumothorax. While this is favored to represent ex vacuo given the tumor mass of the right upper lobe, it is possible that there could be underlying air leak. Close observation recommended, potentially with repeat plain film. These results were called by telephone at the time of interpretation on 10/15/2018 at 7:37 pm to nurse caring for the patient, Ms Owens Shark. Electronically Signed   By: Corrie Mckusick D.O.   On: 10/15/2018 19:38   Ir Perc Pleural Drain W/indwell Cath W/img Guide  Result Date: 10/15/2018 INDICATION: 57 year old female with a recurrent symptomatic right-sided malignant pleural effusion. She presents for tunneled pleural drainage catheter placement. EXAM: IR PERC PLEURAL DRAIN W/INDWELL CATH W/IMG GUIDE MEDICATIONS: 2 g Ancef ANESTHESIA/SEDATION: Fentanyl 25 mcg IV; Versed 1 mg IV Moderate Sedation Time:  13 minutes The patient was continuously monitored during the procedure by the interventional radiology nurse under my direct supervision. COMPLICATIONS: None immediate. PROCEDURE: Informed written consent was obtained from the patient after a thorough discussion of the procedural risks, benefits and alternatives. All questions were addressed. Maximal Sterile Barrier  Technique was utilized including caps, mask, sterile gowns, sterile gloves, sterile drape, hand hygiene and skin antiseptic. A timeout was performed prior to the initiation of the procedure. Ultrasound was used to interrogate the right chest. There is a large pleural effusion. Local  anesthesia was attained by infiltration with 1% lidocaine. A small dermatotomy was made. An 18 gauge sheath needle was carefully advanced into the pleural fluid. The needle portion was removed. A 75 cm Amplatz wire was then advanced into the pleural space. A suitable skin exit site anterior and medial to the pleural entry site was selected. Local anesthesia was again attained by infiltration with 1% lidocaine. A small dermatotomy was made. The 15.5 French PleurX tunneled drainage catheter was then tunneled from the skin exit site to the dermatotomy overlying the pleural entry site. A peel-away sheath was then advanced over the wire and into the pleural space. The catheter was advanced through the peel-away sheath and the peel-away sheath was discarded. The catheter was connected to suction and a total of 2 L of pleural fluid was evacuated. The dermatotomy overlying the pleural entry site was closed with a single inverted interrupted 3 0 Vicryl suture in the epidermis sealed with Dermabond. The catheter was secured at the exit site with an 0 Prolene retention suture. Sterile bandages were applied. The patient tolerated the procedure well. IMPRESSION: Technically successful placement of a right-sided tunneled pleural drainage catheter. Electronically Signed   By: Jacqulynn Cadet M.D.   On: 10/15/2018 15:36    Labs:  CBC: Recent Labs    10/13/18 0449 10/14/18 0331 10/15/18 0447 10/16/18 0458  WBC 9.0 9.3 9.1 8.9  HGB 11.2* 10.9* 10.6* 11.0*  HCT 34.9* 34.7* 34.1* 35.6*  PLT 182 173 153 157    COAGS: Recent Labs    10/13/18 1131 10/14/18 0655 10/15/18 0447 10/16/18 0458  INR 3.4* 3.2* 3.3* 3.4*    BMP: Recent Labs    09/28/18 0719 10/12/18 2218 10/13/18 0449 10/14/18 0331  NA 127* 128* 128* 130*  K 4.3 3.8 4.1 4.3  CL 89* 88* 88* 90*  CO2 26 26 27 26   GLUCOSE 112* 97 107* 97  BUN 8 7 8 8   CALCIUM 9.0 9.0 9.1 8.9  CREATININE 0.61 0.49 0.47 0.52  GFRNONAA >60 >60 >60 >60    GFRAA >60 >60 >60 >60    LIVER FUNCTION TESTS: Recent Labs    08/22/18 1052 09/22/18 1135 10/12/18 2218 10/14/18 0331  BILITOT 0.1* 0.6 0.9 0.9  AST 22 23 22 25   ALT 23 21 18 19   ALKPHOS 150* 294* 184* 174*  PROT 7.6 8.0 7.0 6.5  ALBUMIN 4.0 3.6 2.8* 2.7*    Assessment and Plan: Recurrent right pleural effusion, s/p right Pleurx placement 10/15/18 by Dr. Garlon Hatchet in place. No concerns noted. CXR yesterday showed pneumothorax, likely ex vacuo due to right upper lobe tumor. This has been present since 4/6. CXR repeated this AM and shows stability.  Patient planning to d/c home today with assistance from neighbor.  RN to complete training.  Dr. Julien Nordmann is assisting with [facility] supplies.  IR available if needed.  Electronically Signed: Docia Barrier, PA 10/16/2018, 2:15 PM   I spent a total of 15 Minutes at the the patient's bedside AND on the patient's hospital floor or unit, greater than 50% of which was counseling/coordinating care for lung cancer, recurrent right pleural effusion.

## 2018-10-16 NOTE — Progress Notes (Signed)
PALLIATIVE NOTE:  Patient awake, alert, and oriented x3. She denied pain or shortness of breath at rest. Does endorse some shortness of breath on occasions and with exertion.   Patient is being discharged home with friend Cammy. I spoke with patient to re-clarify her goals. She expressed goals remain she wants to return home with her 2 cats and friends with the support of hospice. She remains adamant she is not interested in chemotherapy and is aware that her care is more comfort focused. She expressed her wishes are to make sure she is not suffering and pain is manageable during the time she has left. S  She verbalized her appreciation and comfort on her current pain regimen and is hopeful she will continue to feel this way upon returning home. She is not interested in PT and is aware with hospice support she will not be eligible for home health care and hospice. She expressed understanding and states "hospice services are my main priority". Support given.   Patient is happy to be going home. She again is appreciative of all care and support received.   I have discussed with Mrs. Cookie, CM and Abby, RN patient's home needs for hospice.   All questions answered.   Total Time: 35 min.  Greater than 50%  of this time was spent counseling and coordinating care related to the above assessment and plan  Alda Lea, AGPCNP-BC Palliative Medicine Team  Phone: [phone] Pager: [phone] Amion: N. Cousar

## 2018-10-24 ENCOUNTER — Encounter: Payer: Self-pay | Admitting: Radiation Oncology

## 2018-11-13 ENCOUNTER — Ambulatory Visit: Payer: Managed Care, Other (non HMO) | Admitting: Radiation Oncology

## 2018-11-17 DEATH — deceased

## 2019-05-27 ENCOUNTER — Encounter: Payer: Self-pay | Admitting: *Deleted

## 2020-09-30 IMAGING — RF DG C-ARM 61-120 MIN
1 series · 1 of 1 positions shown · non-contrast
Comparison: None.

CLINICAL DATA: C3 corpectomy with C2 through C4 plate.

EXAM:
DG CERVICAL SPINE - 1 VIEW; DG C-ARM 61-120 MIN

[Series 1: run · 1 of 1 slices shown]
[im 1/1]
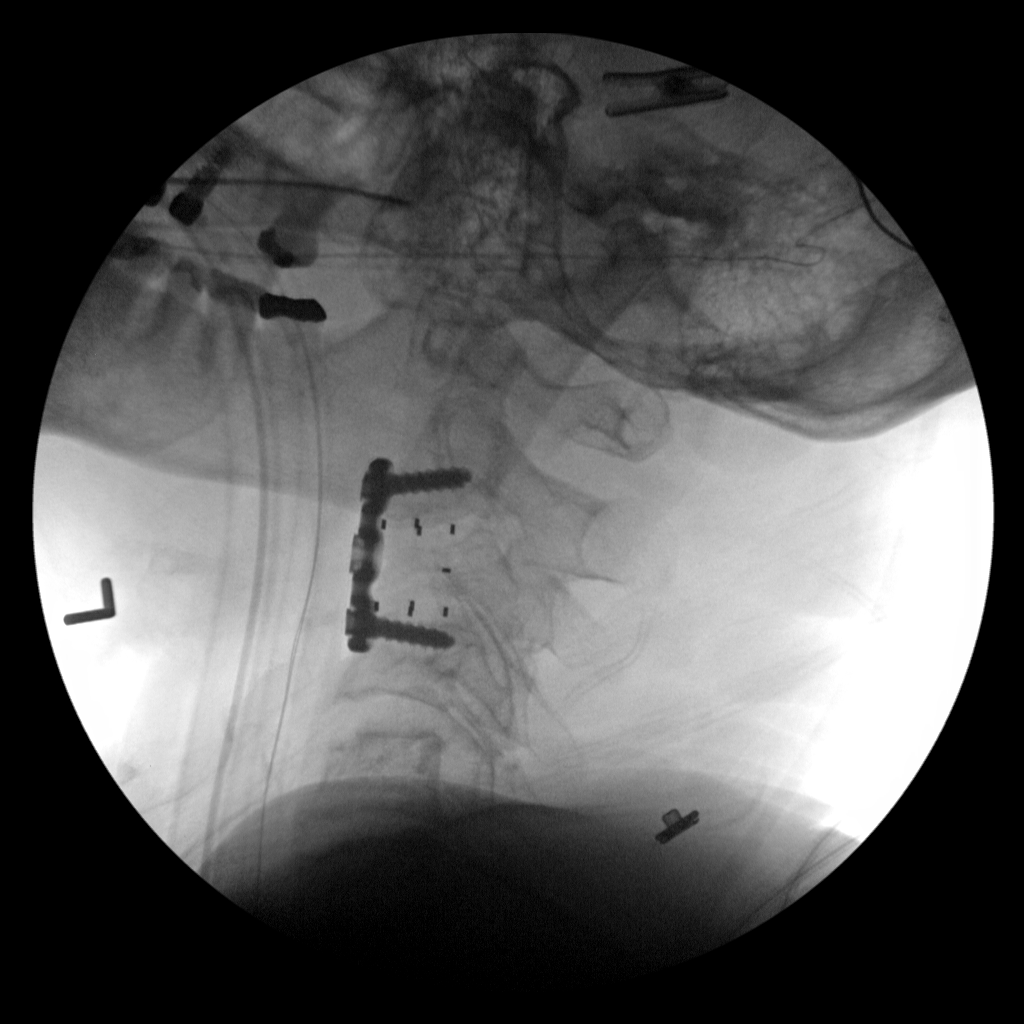

[1 of 1 positions shown; findings below may reference images not displayed]

FINDINGS: Intraoperative lateral view of the cervical spine status post C3
corpectomy with anterior cervical plate and screw fixation from C2
through C4 is identified. A total of 18 seconds of fluoroscopic time
was utilized. Fine bony detail is limited due to the C-arm
fluoroscopic technique. No immediate intraoperative complications
are apparent.
IMPRESSION: C3 corpectomy with anterior cervical fusion from C2 through C4. A
total of 18 seconds of fluoroscopic time was utilized.

## 2020-10-11 IMAGING — CT CT CHEST WITH CONTRAST
2 of 5 series · 12 of 36 positions shown, 15 images · IV contrast (APPLIED)
Comparison: None.

CLINICAL DATA: Newly diagnosed right lung non-small-cell carcinoma.
Staging.

EXAM:
CT CHEST, ABDOMEN, AND PELVIS WITH CONTRAST
TECHNIQUE: Multidetector CT imaging of the chest, abdomen and pelvis was
performed following the standard protocol during bolus
administration of intravenous contrast.
CONTRAST:  100mL OMNIPAQUE IOHEXOL 300 MG/ML SOLN, 30mL OMNIPAQUE
IOHEXOL 300 MG/ML SOLN

[Series 2: cap with · axial · 0.83mm/px · z∈[-499,+21]mm · 9 of 131 slices shown, 12 images]
[im 14/131  mediastinal]
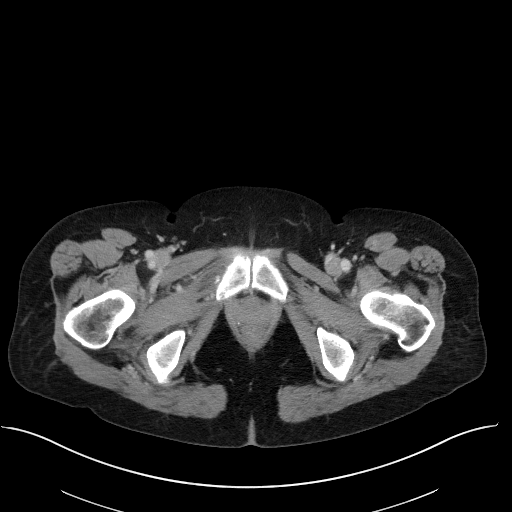
[im 14/131  lung]
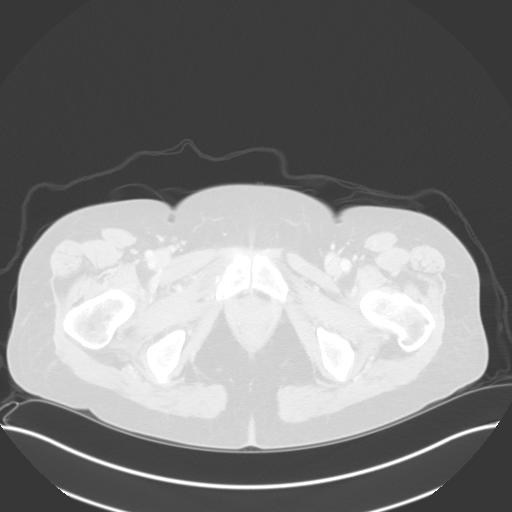
[im 27/131  lung]
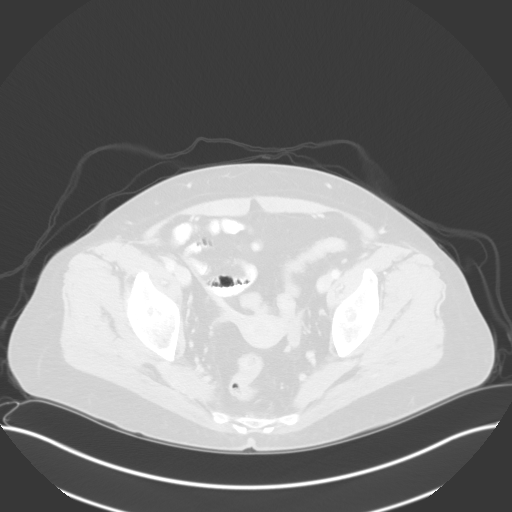
[im 40/131  lung]
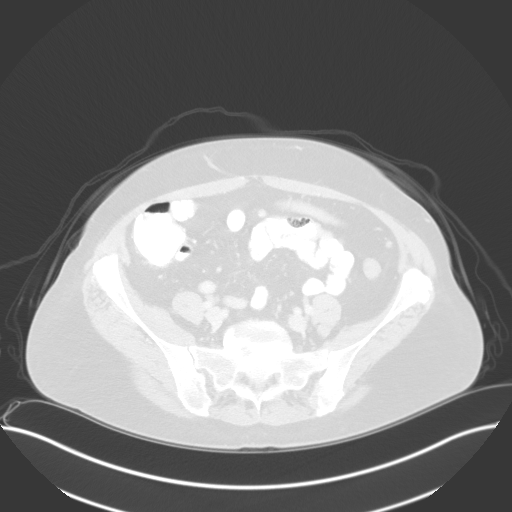
[im 53/131  lung]
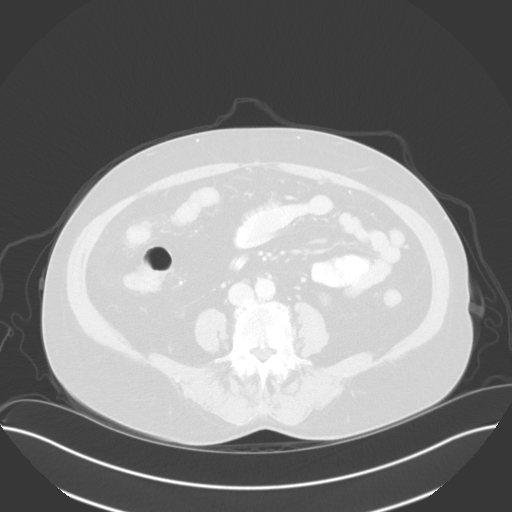
[im 66/131  mediastinal]
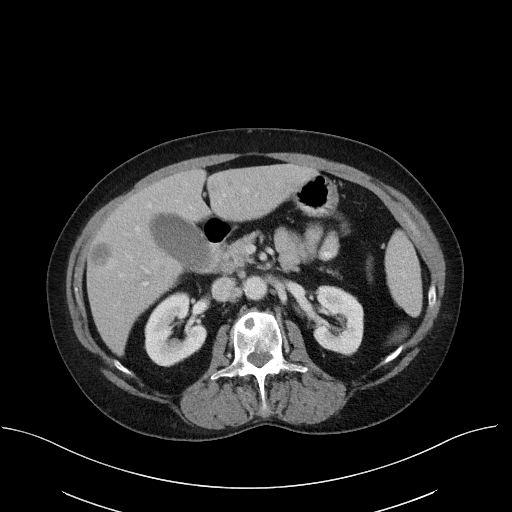
[im 66/131  lung]
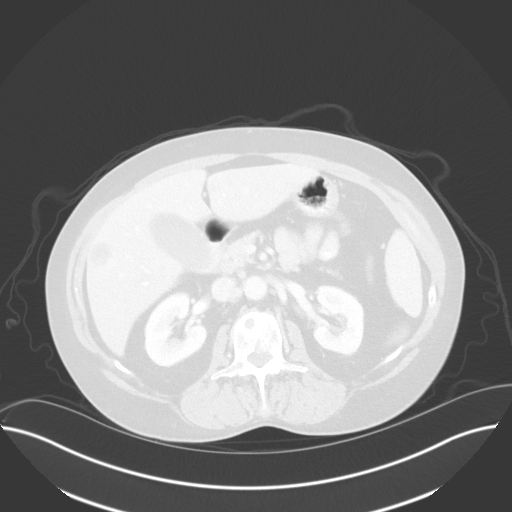
[im 79/131  lung]
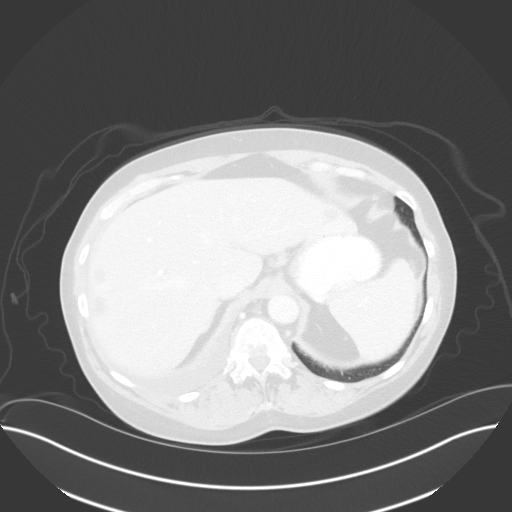
[im 92/131  lung]
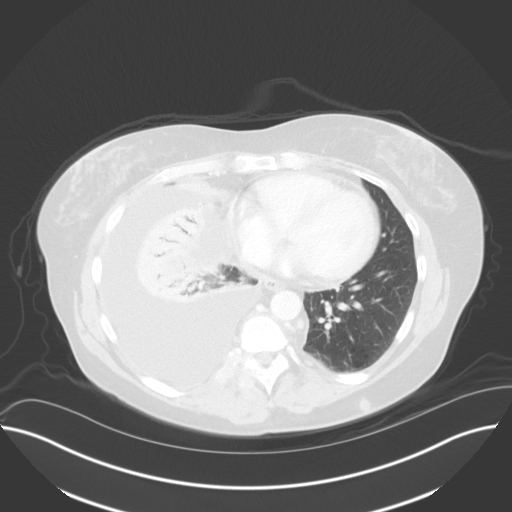
[im 105/131  lung]
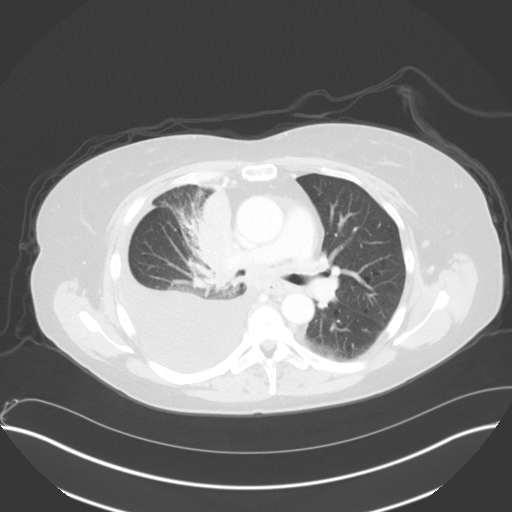
[im 118/131  mediastinal]
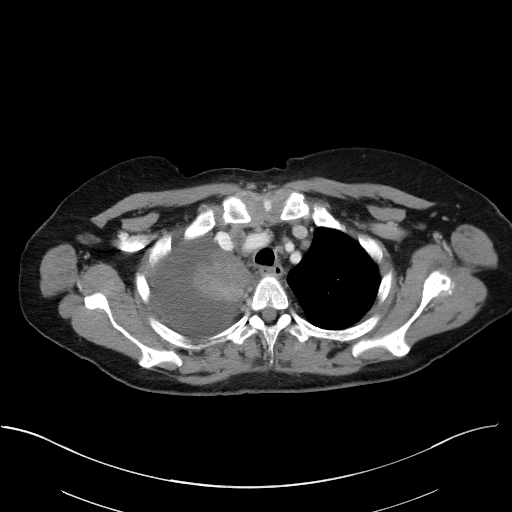
[im 118/131  lung]
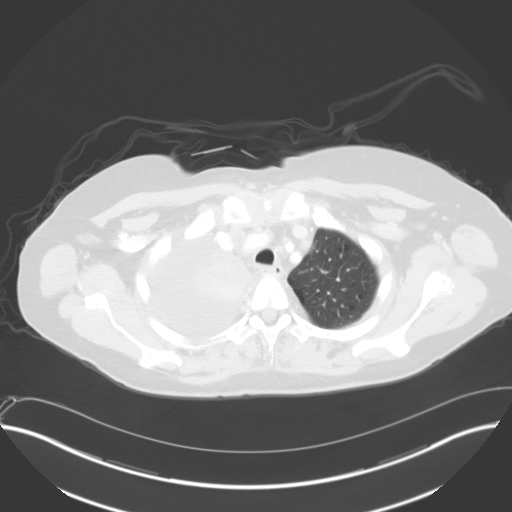

[Series 5: coronals · coronal · 0.82mm/px · 3 of 137 slices shown]
[im 28/137  lung]
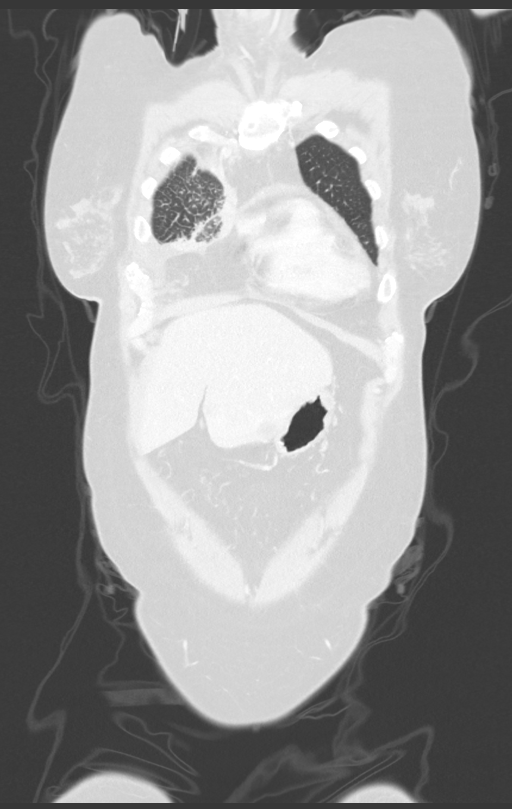
[im 55/137  lung]
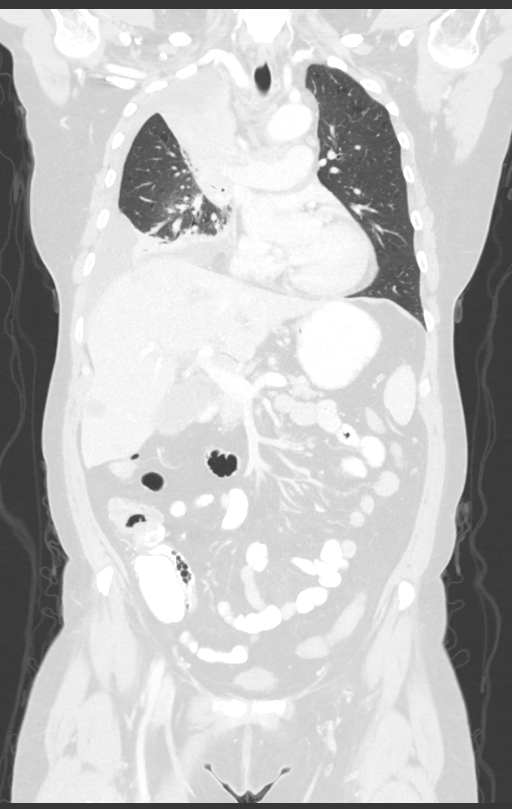
[im 82/137  lung]
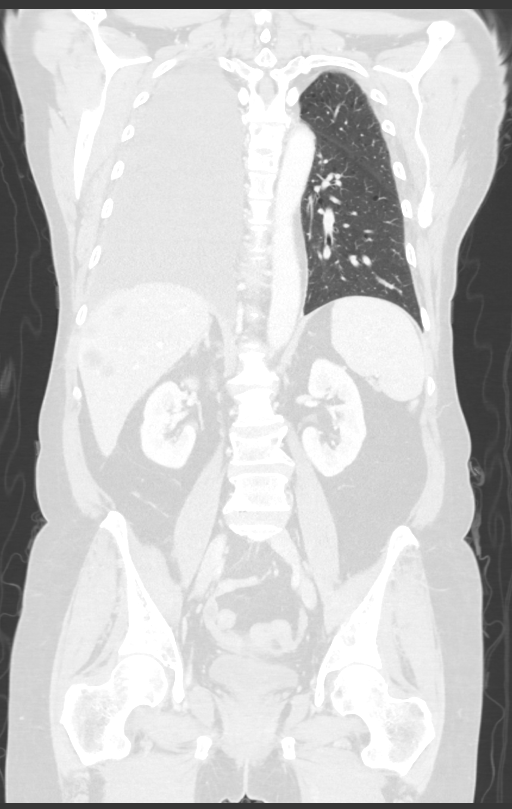

[12 of 36 positions shown; findings below may reference images not displayed]

FINDINGS: CT CHEST FINDINGS

Cardiovascular: No acute findings.

Mediastinum/Lymph Nodes: Mild mediastinal lymphadenopathy is seen in
the pre-vascular, lateral aortic, right paratracheal, AP window, and
subcarinal regions. Largest site in the subcarinal region measures
2.0 cm in short axis.

Lungs/Pleura: Masslike opacity is seen in the central right upper
lobe, which involves the right hilum and obstructs the right upper
lobe bronchus. This measures approximately 7.3 x 5.0 cm,, but is
difficult to differentiate from postobstructive collapse. A large
right pleural effusion also results in compressive atelectasis.

Musculoskeletal:  No suspicious bone lesions identified.

CT ABDOMEN AND PELVIS FINDINGS

Hepatobiliary: Multiple hypovascular masses are seen throughout the
right and left lobes measuring up to 3 cm, consistent with diffuse
liver metastases. Tiny calcified gallstone is noted. No evidence of
cholecystitis or biliary ductal dilatation

Pancreas:  No mass or inflammatory changes.

Spleen:  Within normal limits in size and appearance.

Adrenals/Urinary tract: Normal appearance of both adrenal glands. No
evidence of renal masses or hydronephrosis. Unremarkable unopacified
urinary bladder.

Stomach/Bowel: No evidence of obstruction, inflammatory process, or
abnormal fluid collections.

Vascular/Lymphatic: Mild lymphadenopathy is seen in the porta
hepatis, with largest lymph node measuring 1.4 cm. Multiple small
sub-cm lymph nodes are seen abdominal retroperitoneum in the left
paraaortic and aortocaval spaces. No pelvic lymphadenopathy
identified. Aortic atherosclerosis.

Reproductive:  No mass or other significant abnormality identified.

Other:  None.

Musculoskeletal: Numerous lytic bone metastases are seen throughout
the spine, pelvis, and involving several anterior right ribs.
IMPRESSION: 1. Large central right upper lobe mass, which involves the right
hilum and obstructs the central right upper lobe bronchus,
consistent with primary bronchogenic carcinoma.
2. Mild mediastinal lymphadenopathy, consistent with metastatic
disease.
3. Large right pleural effusion, suspicious for malignant effusion.
4. Diffuse liver metastases.
5. Diffuse lytic bone metastases.
6. Mild abdominal porta hepatis and retroperitoneal lymphadenopathy,
consistent with metastatic disease.

## 2020-10-25 IMAGING — DX PORTABLE CHEST - 1 VIEW
1 series · 2 of 2 positions shown · non-contrast
Comparison: 09/22/2018

CLINICAL DATA: Right pneumothorax.

EXAM:
PORTABLE CHEST 1 VIEW

[Series 1: chest ap · 0.14mm/px · 2 of 2 slices shown]
[im 1/2]
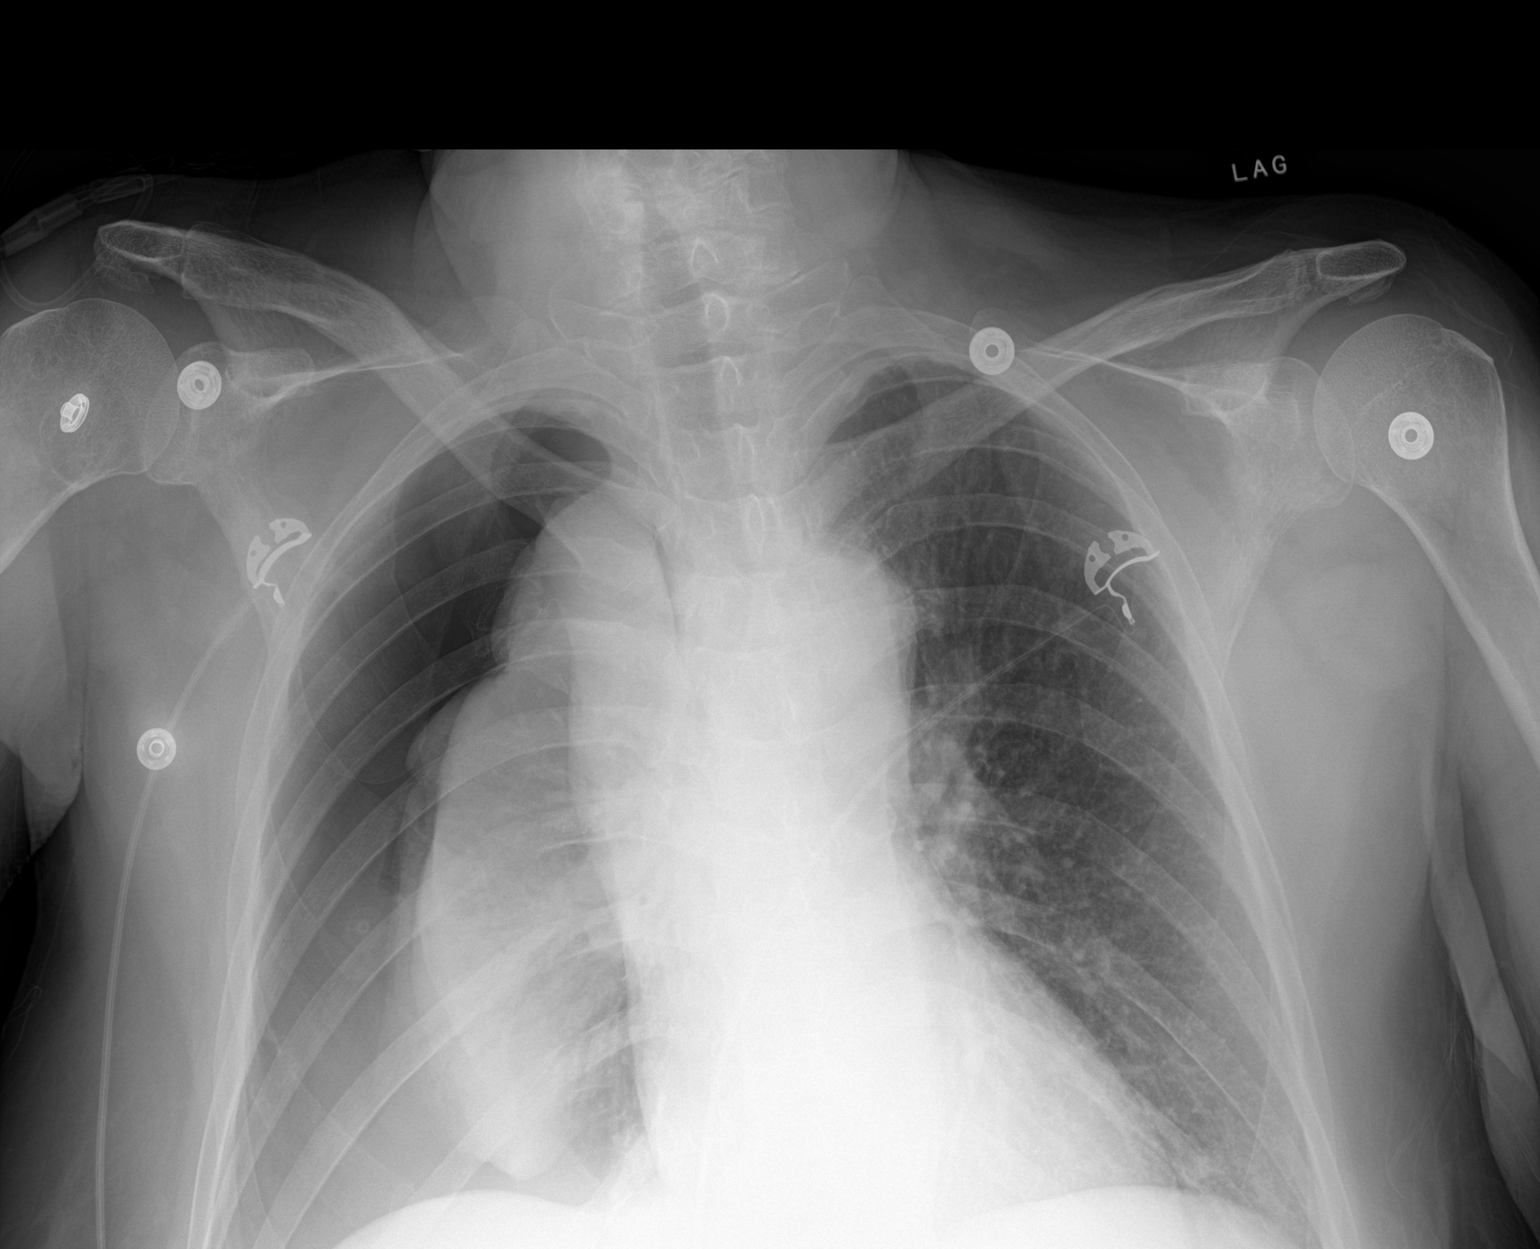
[im 2/2]
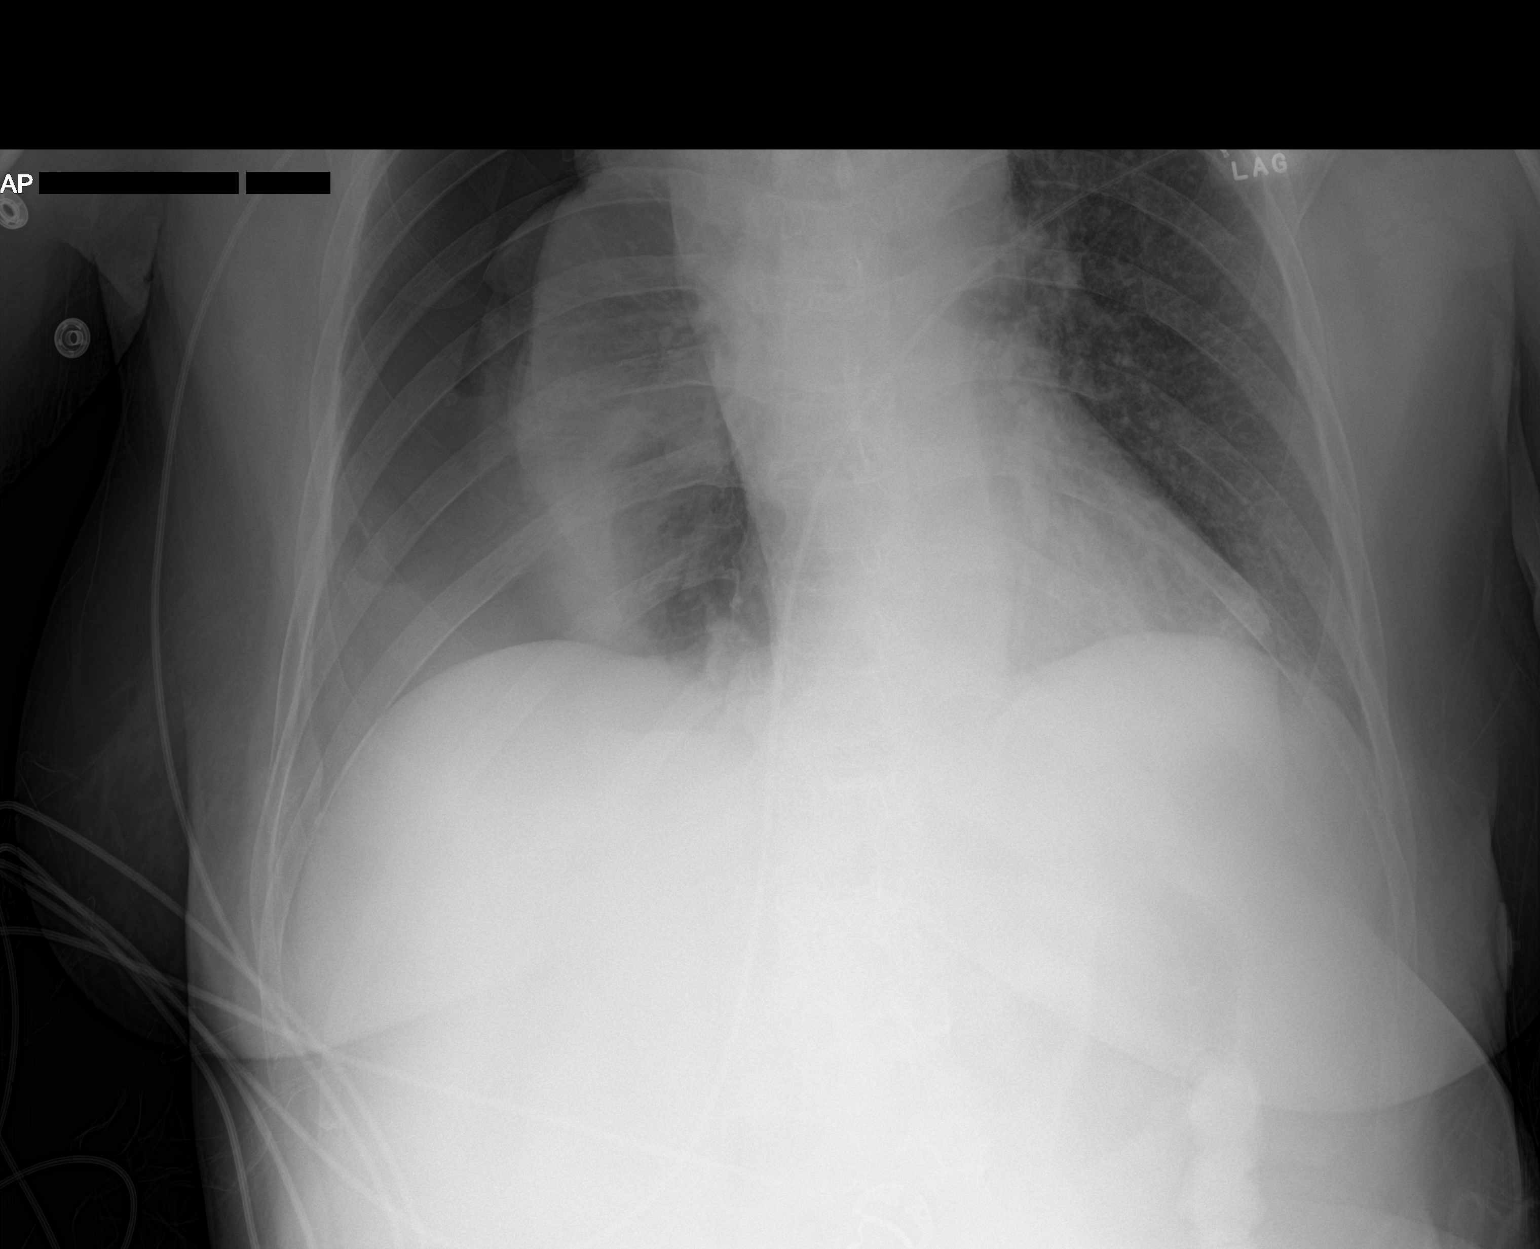

[2 of 2 positions shown; findings below may reference images not displayed]

FINDINGS: There is a persistent large right pneumothorax with further collapse
of the right lower lobe. The right middle and upper lobes appear
completely collapsed. There is a recurrent small right effusion.
Left lung is clear. Heart size and pulmonary vascularity are normal.
No evidence of tension pneumothorax.
IMPRESSION: Persistent large right pneumothorax with further collapse of the
right lower lobe and with complete collapse of the right middle and
upper lobes. The previous CT scan demonstrated a mass obstructing
the upper lobe bronchus.
